# Patient Record
Sex: Male | Born: 1950
Health system: Southern US, Community
[De-identification: ages and names within clinical notes are randomized; demographics above are authoritative.]

## PROBLEM LIST (undated history)

## (undated) DIAGNOSIS — I2510 Atherosclerotic heart disease of native coronary artery without angina pectoris: Secondary | ICD-10-CM

## (undated) DIAGNOSIS — F419 Anxiety disorder, unspecified: Secondary | ICD-10-CM

## (undated) DIAGNOSIS — F329 Major depressive disorder, single episode, unspecified: Secondary | ICD-10-CM

## (undated) DIAGNOSIS — I10 Essential (primary) hypertension: Secondary | ICD-10-CM

## (undated) DIAGNOSIS — J42 Unspecified chronic bronchitis: Secondary | ICD-10-CM

## (undated) DIAGNOSIS — I255 Ischemic cardiomyopathy: Secondary | ICD-10-CM

## (undated) DIAGNOSIS — E669 Obesity, unspecified: Secondary | ICD-10-CM

## (undated) DIAGNOSIS — K219 Gastro-esophageal reflux disease without esophagitis: Secondary | ICD-10-CM

## (undated) DIAGNOSIS — I219 Acute myocardial infarction, unspecified: Secondary | ICD-10-CM

## (undated) DIAGNOSIS — N529 Male erectile dysfunction, unspecified: Secondary | ICD-10-CM

## (undated) DIAGNOSIS — F32A Depression, unspecified: Secondary | ICD-10-CM

## (undated) DIAGNOSIS — E785 Hyperlipidemia, unspecified: Secondary | ICD-10-CM

## (undated) DIAGNOSIS — R7303 Prediabetes: Secondary | ICD-10-CM

## (undated) DIAGNOSIS — N200 Calculus of kidney: Secondary | ICD-10-CM

## (undated) DIAGNOSIS — N182 Chronic kidney disease, stage 2 (mild): Secondary | ICD-10-CM

## (undated) HISTORY — PX: CORONARY ANGIOPLASTY WITH STENT PLACEMENT: SHX49

## (undated) HISTORY — DX: Male erectile dysfunction, unspecified: N52.9

## (undated) HISTORY — DX: Calculus of kidney: N20.0

## (undated) HISTORY — PX: CYSTOSCOPY W/ STONE MANIPULATION: SHX1427

## (undated) HISTORY — DX: Atherosclerotic heart disease of native coronary artery without angina pectoris: I25.10

## (undated) HISTORY — DX: Essential (primary) hypertension: I10

---

## 1992-02-10 HISTORY — PX: CORONARY ARTERY BYPASS GRAFT: SHX141

## 1999-11-12 ENCOUNTER — Encounter: Payer: Self-pay | Admitting: Emergency Medicine

## 1999-11-12 ENCOUNTER — Inpatient Hospital Stay (HOSPITAL_COMMUNITY): Admission: EM | Admit: 1999-11-12 | Discharge: 1999-11-15 | Payer: Self-pay | Admitting: Emergency Medicine

## 2000-11-12 ENCOUNTER — Encounter: Payer: Self-pay | Admitting: *Deleted

## 2000-11-12 ENCOUNTER — Ambulatory Visit (HOSPITAL_COMMUNITY): Admission: RE | Admit: 2000-11-12 | Discharge: 2000-11-13 | Payer: Self-pay | Admitting: Unknown Physician Specialty

## 2001-01-12 ENCOUNTER — Encounter: Payer: Self-pay | Admitting: Emergency Medicine

## 2001-01-12 ENCOUNTER — Emergency Department (HOSPITAL_COMMUNITY): Admission: EM | Admit: 2001-01-12 | Discharge: 2001-01-12 | Payer: Self-pay | Admitting: Emergency Medicine

## 2001-02-07 ENCOUNTER — Encounter: Payer: Self-pay | Admitting: Orthopedic Surgery

## 2001-02-07 ENCOUNTER — Ambulatory Visit (HOSPITAL_COMMUNITY): Admission: RE | Admit: 2001-02-07 | Discharge: 2001-02-07 | Payer: Self-pay | Admitting: Orthopedic Surgery

## 2001-04-04 ENCOUNTER — Ambulatory Visit (HOSPITAL_BASED_OUTPATIENT_CLINIC_OR_DEPARTMENT_OTHER): Admission: RE | Admit: 2001-04-04 | Discharge: 2001-04-04 | Payer: Self-pay | Admitting: Orthopedic Surgery

## 2005-08-09 ENCOUNTER — Emergency Department (HOSPITAL_COMMUNITY): Admission: EM | Admit: 2005-08-09 | Discharge: 2005-08-09 | Payer: Self-pay | Admitting: Emergency Medicine
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
## 2007-04-04 ENCOUNTER — Emergency Department (HOSPITAL_COMMUNITY): Admission: EM | Admit: 2007-04-04 | Discharge: 2007-04-05 | Payer: Self-pay | Admitting: Emergency Medicine

## 2007-04-12 ENCOUNTER — Emergency Department (HOSPITAL_COMMUNITY): Admission: EM | Admit: 2007-04-12 | Discharge: 2007-04-12 | Payer: Self-pay | Admitting: Emergency Medicine

## 2007-04-14 ENCOUNTER — Emergency Department (HOSPITAL_COMMUNITY): Admission: EM | Admit: 2007-04-14 | Discharge: 2007-04-15 | Payer: Self-pay | Admitting: Emergency Medicine

## 2008-03-02 ENCOUNTER — Emergency Department (HOSPITAL_COMMUNITY): Admission: EM | Admit: 2008-03-02 | Discharge: 2008-03-02 | Payer: Self-pay | Admitting: Emergency Medicine

## 2010-01-13 ENCOUNTER — Emergency Department (HOSPITAL_COMMUNITY)
Admission: EM | Admit: 2010-01-13 | Discharge: 2010-01-14 | Payer: Self-pay | Source: Home / Self Care | Admitting: Emergency Medicine

## 2010-04-05 ENCOUNTER — Inpatient Hospital Stay (INDEPENDENT_AMBULATORY_CARE_PROVIDER_SITE_OTHER)
Admission: RE | Admit: 2010-04-05 | Discharge: 2010-04-05 | Disposition: A | Discharge status: Home / Self Care | Payer: Self-pay | Source: Ambulatory Visit | Attending: Family Medicine | Admitting: Family Medicine

## 2010-04-05 DIAGNOSIS — R05 Cough: Secondary | ICD-10-CM

## 2010-04-05 DIAGNOSIS — N529 Male erectile dysfunction, unspecified: Secondary | ICD-10-CM

## 2010-04-24 ENCOUNTER — Inpatient Hospital Stay (INDEPENDENT_AMBULATORY_CARE_PROVIDER_SITE_OTHER)
Admission: RE | Admit: 2010-04-24 | Discharge: 2010-04-24 | Disposition: A | Discharge status: Home / Self Care | Payer: Self-pay | Source: Ambulatory Visit | Attending: Family Medicine | Admitting: Family Medicine

## 2010-04-24 DIAGNOSIS — M461 Sacroiliitis, not elsewhere classified: Secondary | ICD-10-CM

## 2010-04-24 DIAGNOSIS — S335XXA Sprain of ligaments of lumbar spine, initial encounter: Secondary | ICD-10-CM

## 2010-04-24 DIAGNOSIS — S93409A Sprain of unspecified ligament of unspecified ankle, initial encounter: Secondary | ICD-10-CM

## 2010-04-28 ENCOUNTER — Emergency Department (HOSPITAL_COMMUNITY): Payer: Self-pay

## 2010-04-28 ENCOUNTER — Emergency Department (HOSPITAL_COMMUNITY)
Admission: EM | Admit: 2010-04-28 | Discharge: 2010-04-29 | Disposition: A | Discharge status: Home / Self Care | Payer: Self-pay | Attending: Emergency Medicine | Admitting: Emergency Medicine

## 2010-04-28 DIAGNOSIS — S8990XA Unspecified injury of unspecified lower leg, initial encounter: Secondary | ICD-10-CM | POA: Insufficient documentation

## 2010-04-28 DIAGNOSIS — R0609 Other forms of dyspnea: Secondary | ICD-10-CM | POA: Insufficient documentation

## 2010-04-28 DIAGNOSIS — W138XXA Fall from, out of or through other building or structure, initial encounter: Secondary | ICD-10-CM | POA: Insufficient documentation

## 2010-04-28 DIAGNOSIS — R030 Elevated blood-pressure reading, without diagnosis of hypertension: Secondary | ICD-10-CM | POA: Insufficient documentation

## 2010-04-28 DIAGNOSIS — R059 Cough, unspecified: Secondary | ICD-10-CM | POA: Insufficient documentation

## 2010-04-28 DIAGNOSIS — M25476 Effusion, unspecified foot: Secondary | ICD-10-CM | POA: Insufficient documentation

## 2010-04-28 DIAGNOSIS — R109 Unspecified abdominal pain: Secondary | ICD-10-CM | POA: Insufficient documentation

## 2010-04-28 DIAGNOSIS — I252 Old myocardial infarction: Secondary | ICD-10-CM | POA: Insufficient documentation

## 2010-04-28 DIAGNOSIS — N200 Calculus of kidney: Secondary | ICD-10-CM | POA: Insufficient documentation

## 2010-04-28 DIAGNOSIS — R0989 Other specified symptoms and signs involving the circulatory and respiratory systems: Secondary | ICD-10-CM | POA: Insufficient documentation

## 2010-04-28 DIAGNOSIS — Z951 Presence of aortocoronary bypass graft: Secondary | ICD-10-CM | POA: Insufficient documentation

## 2010-04-28 DIAGNOSIS — R05 Cough: Secondary | ICD-10-CM | POA: Insufficient documentation

## 2010-04-28 DIAGNOSIS — R609 Edema, unspecified: Secondary | ICD-10-CM | POA: Insufficient documentation

## 2010-04-28 DIAGNOSIS — R51 Headache: Secondary | ICD-10-CM | POA: Insufficient documentation

## 2010-04-28 DIAGNOSIS — M25579 Pain in unspecified ankle and joints of unspecified foot: Secondary | ICD-10-CM | POA: Insufficient documentation

## 2010-04-28 DIAGNOSIS — R079 Chest pain, unspecified: Secondary | ICD-10-CM | POA: Insufficient documentation

## 2010-04-28 DIAGNOSIS — M25473 Effusion, unspecified ankle: Secondary | ICD-10-CM | POA: Insufficient documentation

## 2010-04-28 LAB — POCT CARDIAC MARKERS
CKMB, poc: 2.1 ng/mL (ref 1.0–8.0)
Myoglobin, poc: 104 ng/mL (ref 12–200)

## 2010-04-28 LAB — BASIC METABOLIC PANEL
CO2: 24 mEq/L (ref 19–32)
Calcium: 9 mg/dL (ref 8.4–10.5)
Creatinine, Ser: 1.42 mg/dL (ref 0.4–1.5)
GFR calc Af Amer: >60 mL/min (ref >60)
Glucose, Bld: 178 mg/dL — ABNORMAL HIGH (ref 70–99)
Potassium: 3.3 mEq/L — ABNORMAL LOW (ref 3.5–5.1)
Sodium: 133 mEq/L — ABNORMAL LOW (ref 135–145)

## 2010-04-28 LAB — URINE MICROSCOPIC-ADD ON

## 2010-04-28 LAB — CBC
Hemoglobin: 14.4 g/dL (ref 13.0–17.0)
MCH: 30.6 pg (ref 26.0–34.0)
MCHC: 34.7 g/dL (ref 30.0–36.0)
MCV: 88.1 fL (ref 78.0–100.0)
Platelets: 156 10*3/uL (ref 150–400)
RDW: 12.1 % (ref 11.5–15.5)
WBC: 9.4 10*3/uL (ref 4.0–10.5)

## 2010-04-28 LAB — URINALYSIS, ROUTINE W REFLEX MICROSCOPIC
Ketones, ur: NEGATIVE mg/dL
Protein, ur: 30 mg/dL — AB
Urobilinogen, UA: 0.2 mg/dL (ref 0.0–1.0)

## 2010-04-28 LAB — HEPATIC FUNCTION PANEL
ALT: 30 U/L (ref 0–53)
Alkaline Phosphatase: 59 U/L (ref 39–117)

## 2010-04-28 LAB — DIFFERENTIAL
Lymphocytes Relative: 27 % (ref 12–46)
Neutrophils Relative %: 67 % (ref 43–77)

## 2010-04-29 ENCOUNTER — Encounter (HOSPITAL_COMMUNITY): Payer: Self-pay | Admitting: Radiology

## 2010-04-29 MED ORDER — IOHEXOL 300 MG/ML  SOLN
100.0000 mL | Freq: Once | INTRAMUSCULAR | Status: AC | PRN
  Administered 2010-04-29: 100 mL via INTRAVENOUS

## 2010-06-03 ENCOUNTER — Encounter: Payer: Self-pay | Admitting: Cardiology

## 2010-06-27 NOTE — Cardiovascular Report (Signed)
[place]. [facility]  Patient:    William Matthews, William Matthews                       MRN: [id number] Proc. Date: 11/12/99 Adm. Date:  [id number] Disc. Date: [id number] Attending:  Darlin Priestly CC:         Madaline Savage, M.D.                        Cardiac Catheterization  PROCEDURES: 1. Left heart catheterization. 2. Coronary angiography. 3. Left ventriculogram. 4. Saphenous vein grafts. 5. Left internal mammary angiogram. 6. Right coronary artery-mid    - Angiojet rheolytic system    - Placement of intracoronary stent  ATTENDING:  Lenise Herald, M.D.  COMPLICATIONS:  None.  INDICATIONS:  Mr. Yun is a 60 year old white male previous patient of Dr. Lavonne Chick with a history of coronary artery bypass surgery in 1994 by Dr. Kennon Portela consisting of a sequential LIMA to LAD and second diagonal and a sequential vein graft to OM-1 and first diagonal.  The patient had an ungrafted RCA.  The patient has done well for the last seven years until 11:00 this a.m. when he developed shortness of breath and substernal chest pain radiating to both arms with nausea and diaphoresis.  He presents to the ER with acute inferior MI.  He is now brought to the cardiac catheterization lab emergently.  DESCRIPTION OF PROCEDURE:  After giving informed written consent, the patient was brought to the cardiac catheterization lab where his right and left groins were shaved, prepped, and draped in the usual sterile fashion.  ECT monitoring was established.  Using modified Seldinger technique, a 7-French arterial sheath was inserted in the right femoral artery.  A 6-French venous sheath was inserted in the right femoral vein.  Next, a 6-French transvenous pacing wire was floated into the RV apex without complications.  RV pacing thresholds were then determined.  Next, a 6-French JL4 diagnostic catheter was then used to engage the left main, and a selective angiogram was then  performed.  This revealed a 50% mid left main.  There was a totally occluded LAD proximally. The mid and distal LAD fills via a sequential left internal mammary to the second diagonal and the LAD.  The IMA is patent.  There is a 60% lesion in the diagonal beyond the IMA insertion.  There is a 50% lesion in the IMA just prior to the insertion of the LAD and a 50% lesion in the LAD beyond the IMA insertion.  There is a second diagonal which fills via a sequential saphenous vein graft to OM-1 and D-1.  The vein graft proper has no significant disease.  There is no significant disease in the diagonal beyond the vein graft touchdown.  The circumflex is a medium sized vessel which coursed in the AV groove and gives rise to three obtuse marginal branches.  The 30% proximal circumflex lesion appear at the first OM, fills via a sequential saphenous vein graft to the first OM and D-1.  There is a 60% lesion in the OM beyond the vein graft touchdown.  The second and third OMs are medium sized vessels with no significant disease.  The right coronary artery is a large vessel which is totally occluded in its proximal portion with visible thrombus.  A left ventriculogram reveals preserved EF at 50% with posterobasal hypokinesis.  There is mild to moderate mitral regurgitation.  HEMODYNAMICS:  Systemic arterial pressure 132/86; LV systemic pressure 130/12, LVEDP of 18.  INTERVENTIONAL PROCEDURE:  Following diagnostic angiography, a 7-French JR4 guiding catheter with sideholes was coaxially engaged in the right coronary ostium.  Selective angiograms were then performed.  Next, a long Patriot 0.014 exchange length guide wire was advanced out of the guiding catheter into the proximal RCA.  The guide wire was then used to cross the total RCA occlusion and position the distal PDA without difficulty.  Next, the Angiojet 4-French catheter was then tracked into the mid RCA.  Two subsequent runs were  then performed throughout the mid and proximal RCA for a total of 48 seconds. Followup angiograms revealed a small linear dissection in the mid RCA at the 99% stenosis mid RCA lesion.  The Angiojet catheter was then exchanged for a Penta 3.0 x 15-mm stent which was then positioned across the stenotic lesion and subsequently deployed to a maximum of 14 atmospheres for a total of one minute.  Followup angiograms revealed no evidence of dissection or thrombus with TIMI-3 flow to the distal vessel.  There appeared to be late mid 50% and distal 50% stenotic lesions in the RCA.  In addition, there is noted to be a 60% lesion in the proximal PDA.  Intravenous doses of Aggrastat were maintained.  The patient was given IV heparin to maintain an ACT of between 200 and 300.  Final orthogonal angiograms reveal less than 10% residual stenosis in the mid RCA total occlusion.  There is TIMI-3 flow to the distal vessel with no evidence of dissection or thrombus.  At this point, we elected to conclude the procedure.  All balloons, ______, and catheters were removed.  A hemostatic sheath was then placed, and the patient was transferred back to the CCU in stable condition and to continue with Aggrastat infusion to complete 12 hours.  CONCLUSIONS: 1. Successful rheolytic thrombectomy using 4-French Angiojet catheter with    adjunct stent placement using a Penta 3.0 x 15-mm coronary stent in the mid    RCA stenotic lesion. 2. Patent sequential LIMA to the second diagonal and LAD with a 40% stenotic    lesion in the IMA just prior to the touchdown on the LAD, a 60% lesion in    the diagonal beyond the touchdown, a 50% lesion in the LAD beyond the    touchdown. 3. Patent sequential saphenous vein graft to OM-1 and D-1 with no significant    disease in the body of the graft.  There is a 60% lesion in the OM-1 beyond    the graft touchdown. 4. Preserved LV function with wall motion abnormalities noted  above. 5. Mild to moderate mitral regurgitation. DD:  11/12/99 TD:  11/12/99 Job: [id number]

## 2010-06-27 NOTE — Cardiovascular Report (Signed)
[place]. [facility]  Patient:    BLUE, WINTHER Visit Number: [id number] MRN: [id number]          Service Type: CAT Location: [id number] Attending Physician:  Darlin Priestly Dictated by:   Lenise Herald, M.D. Proc. Date: 11/12/00 Admit Date:  11/12/2000                          Cardiac Catheterization  PROCEDURES: 1. Left heart catheterization. 2. Coronary angiography. 3. Left ventriculogram. 4. Saphenous vein grafts. 5. Left internal mammary angiography. 6. RCA - mid.        - cutting balloon angioplasty.  ATTENDING:  Lenise Herald, M.D.  COMPLICATIONS:  None.  INDICATIONS:  Mr. Widmer is a 60 year old white male with a history of extensive CAD, status post coronary artery bypass surgery consisting of sequential LIMA to D-2 and LAD, and sequential vein graft to D-1 and ramus intermedius.  The patient presented on November 12, 1999, with acute inferior wall MI and was noted to have a totally occluded mid RCA.  The patient underwent angiojet of the mid RCA, with subsequent stenting using a 3.0 Penta. The patient has subsequently stopped all of his medications and presented back to the office with recurrent angina.  He is now brought back for repeat catheterization.  DESCRIPTION OF PROCEDURE:  After giving informed written consent, the patient brought to cardiac catheterization where right and left groins were shaved, prepped, and draped in the usual sterile fashion.  ECG monitoring was established.  Using the modified Seldinger technique, a 6-French arterial sheath was inserted into the right femoral artery.  Six French diagnostic catheters were then used to perform diagnostic angiography.  This revealed a medium sized left main which is diffusely diseased up to 50%.  The LAD is totally occluded in its proximal portion.  The mid and distal LAD fill via a sequential LIMA to the second diagonal and LAD.  The LIMA is widely patent, with  a 50% stenotic lesion at the insertion site on the LAD.  The second diagonal is a small vessel with no significant disease.  The mid and distal LAD have no significant disease.  There is a first diagonal which fills via sequential saphenous vein graft to D-1 and OM-1.  This graft is widely patent.  The first diagonal is a small vessel.  The first obtuse marginal is a medium sized vessel with a 40% lesion beyond the graft insertion.  The left circumflex is a medium sized vessel that coursed in the AV groove and gave rise to four obtuse marginal branches.  The AV groove circumflex then has 30% proximal stenosis.  The first OM, as previously stated, is a grafted vessel with a 40% stenotic lesion beyond the graft insertion.  The second, third, and fourth OMs are medium sized vessel with no significant disease.  The right coronary artery is a medium sized vessel which is noted to be dominant and gives rise to the PDA.  There is a stent in the mid portion of the RCA which appears to have 95% mid in-stent restenosis.  There are course irregularities up to 50% throughout the distal RCA.  Left ventriculogram reveals preserved EF of 55-60% with mild inferior hypokinesis.  HEMODYNAMICS:  Systemic arterial pressure 116/74, LV systemic pressure 119/8, LV EDP 18.  INTERVENTIONAL PROCEDURE, RCA - MID:  Following diagnostic angiography, the 6-French sheath was then exchanged for a 7-French sheath and a 7-French  JR-4 guiding catheter with side-holes was coaxially engaged in the right coronary ostium.  Next, a short Patriot guide wire was advanced down the guiding catheter into the proximal RCA and was then passed to the PDA without difficulty.  Next, a 3.0 x 10 mm cutting balloon was then tracked to the mid portion of the RCA and carefully positioned in the mid portion of the stent. Five subsequent inflations to a maximum of 6 atm were then performed for a total of approximately five minutes with no  evidence of dissection or thrombus and TIMI-3 flow to the distal vessel.  There was no evidence of dissection or thrombus.  IV Integrilin was used with IV heparin to maintain the ACT between 200 and 300.  Final orthogonal angiograms revealed less than 10% residual stenosis within the mid portion of the stent with course irregularities throughout the distal portion of the RCA.  At this point, we elected to conclude the procedure.  All balloons, wires, and catheters were removed.  Hemostatic sheath was sewn into place.  The patient was returned back to the ward in stable condition to continue with Integrilin infusion to complete 18 hours.  CONCLUSIONS: 1. Successful cutting balloon angioplasty of the mid right coronary artery    in-stent stenotic lesion. 2. Patent sequential left internal mammary artery graft to diagonal #2 and    left anterior descending artery with a 50% stenotic lesion at the insertion    on the left anterior descending artery. 3. Patent saphenous vein graft to the first diagonal and first obtuse marginal    with a 40% lesion in the obtuse marginal beyond the graft insertion. 4. Diffuse 50% left main disease. 5. Normal left ventricular systolic function. 6. Adjunct use of Integrilin infusion. Dictated by:   Lenise Herald, M.D. Attending Physician:  Darlin Priestly DD:  11/12/00 TD:  11/13/00 Job: [id number]

## 2010-06-27 NOTE — Discharge Summary (Signed)
[place]. [facility]  Patient:    William Matthews, William Matthews                       MRN: [id number] Adm. Date:  [id number] Disc. Date: [id number] Attending:  Darlin Priestly Dictator:   William Matthews, F.N.P.C. CC:         William Matthews, M.D.   Discharge Summary  DISCHARGE DIAGNOSES: 1. Acute inferior wall myocardial infarction. 2. Coronary artery disease.    a. Rescue percutaneous transluminal coronary angioplasty and stent with       the use of angiojet to the mid right coronary artery.    b. Coronary artery bypass grafting 1994. 3. Hyperlipidemia.  DISCHARGE CONDITION:  Improved.  PROCEDURES: 1. November 12, 1999, urgent combined left heart catheterization with graft    visualization by Dr. Lenise Matthews. 2. November 12, 1999, rescue percutaneous transluminal coronary angioplasty and    stent to the mid right coronary artery.  PENDING STUDIES:  (Not currently in chart). 1. A 2-D echocardiogram was done, results not yet back in this chart.  DISCHARGE MEDICATIONS: 1. Plavix 75 mg 1 a day for 30 days. 2. Enteric coated aspirin 325 mg one a day. 3. Protonix 40 mg 1 a day. 4. Toprol XL 25 mg a day. 5. Altace 2.5 mg one twice a day.  DISCHARGE INSTRUCTIONS:  ACTIVITY:  No strenuous activity, no sexual activity or heavy lifting greater than 10 pounds for 2 weeks.  DIET:  Low fat, low salt, low cholesterol diet.  WOUND CARE:  May shower.  Observe for bruising, bleeding or inflammation at the catheterization site.  Call for fever.  WORK STATUS:  May return to work in 2 weeks.  May not drive for 2 weeks.  FOLLOWUP:  See Dr. Jenne Matthews in 2 weeks, call the office for an appointment data and time.  HISTORY OF PRESENT ILLNESS:  This 60 year old patient woke up on the morning of November 12, 1999, feeling nauseated and weak.  At 1 p.m. the same day he developed chest pain radiating to both arms.  He became diaphoretic and dizzy. He took 2 nitroglycerin  sublingual without relief and came to the emergency room.  In the emergency room the patient had ST elevations in his inferior leads 2, 3, and F with reciprocal changes in aVL, lead 1, and V2.  He was placed on IV heparin, IV nitroglycerin, and was felt be in acute inferior myocardial infarction and was taken emergently to the catheterization lab by Dr. Lenise Matthews.  PAST MEDICAL HISTORY:  Significant for coronary artery bypass grafting x 4 with a LIMA to the LAD, second diagonal saphenous vein graft to the OM and first diagonal in 1994.  Previous to that he had angioplasties to the LAD.  Since his bypass he has not seen a physician regularly and is currently not on any medication. Other histories are hyperlipidemia.  FAMILY HISTORY:  Mother died at 6 of coronary disease and COPD.  Father is 43.  SOCIAL HISTORY:  Denies tobacco, does not use drugs, alcohol.  He has 3 children and is single.  ALLERGIES:  No known drug allergies.  OUTPATIENT MEDICATIONS:  None.  REVIEW OF SYSTEMS:  Essentially negative except for as stated in HPI.  PHYSICAL EXAMINATION:  (At discharge).  VITAL SIGNS:  Blood pressure 99/69, pulse 77, respirations 20, temperature 97.1.  GENERAL:  Alert, oriented male in no acute distress.  LUNGS:  Clear without rales, rhonchi  or wheezes.  CHEST:  Regular rate and rhythm S1, S2.  EXTREMITIES:  Without edema.  LABORATORY DATA:  Admission labs, hemoglobin 14.8, hematocrit 41, WBC 12.4, MCV 88, platelets 228, neutrophils 81, lymphs 14, monos 4, eosinophils 0, basophils 0, leukocytes 1.  White count prior to discharge was 10.7. Hemoglobin 14, hematocrit 39.  Admission pro time 13.8, INR 1.1, PTT 25.  Chemistries:  Sodium 142, potassium 43, chloride 104, CO2 23, glucose 109, BUN 14, creatinine 1.2, calcium 8.8.  These remained stable.  LFTs:  Total protein 6.5, albumin 3.7, AST 41, ALT 26, ALP 56 and total bilirubin 0.9.  Cardiac enzymes:  Initial was 107,  mV 1.5, troponins 0.02.  Second CK was 1729, MB of 279, troponin I 55.51, and that was a peak.  Prior to discharge he was 690 with 92 MBs and 13.23.  Cholesterol panel:  Total cholesterol 162, triglycerides 83, HDL 29, LDL 116. TSH was 1.699.  EKG on admission showed acute ST elevations in 2, 3, and aVF with reciprocal changes in 1, aVL, and V1 and V2 and a complete right bundle branch block.  Followup EKG status post angioplasty and stent, incomplete right bundle. Resolution of ST elevation, though he does have Qs in 2, 3, and aVF.  On the November 13, 1999, sinus rhythm, incomplete right bundle, inferior infarction, and no other significant changes.  Chest x-ray on admission:  No acute disease found.  Mediastinal clips are secondary to prior CABG.  On November 12, 1999, left heart catheterization by Dr. Lenise Matthews. Successful ______  thrombectomy with angiojet catheter and stent placement in the mid RCA reducing 199% stenosis to 0.  He has a patent sequential LIMA to the second diagonal LAD, a 40% stenotic lesion in the LIMA just prior to the touchdown on the LAD and a 60% lesion in the diagonal beyond the touchdown.  A 50% lesion in the LAD beyond the touchdown.  Patent saphenous vein graft to the OM-1 and diagonal 1 but no significant disease.  There is a 60% lesion in the OM-1 beyond the graft touchdown.  Preserved LV function with an EF of 50%.  HOSPITAL COURSE:  [name] was admitted on November 12, 1999, with an acute inferior MI, after experiencing chest pain at home.  Placed on IV heparin and nitroglycerin and taken emergently to the catheterization lab where Dr. Jenne Matthews angioplastied and stented his mid RCA for significant stenosis and thrombus.  Please see catheterization report.  The patient preceded to recover fairly quickly and was anxious to be discharged and there is concern about his compliance as he was on no medications and had not seen a physician regularly  since his bypass surgery.   By November 14, 1999, he was stable, able to ambulate without difficulty, and he was kept for further observation for 1 day and by November 15, 1999, was ready for discharge.  He will follow up as an outpatient.  He will also be approved at [facility] and that is why he was not placed on a Statin agent here in the hospital. DD:  01/18/00 TD:  01/19/00 Job: [id number]

## 2010-06-28 ENCOUNTER — Encounter: Payer: Self-pay | Admitting: Cardiology

## 2010-07-02 ENCOUNTER — Ambulatory Visit (INDEPENDENT_AMBULATORY_CARE_PROVIDER_SITE_OTHER): Payer: Self-pay | Admitting: Cardiology

## 2010-07-02 ENCOUNTER — Encounter: Payer: Self-pay | Admitting: Cardiology

## 2010-07-02 DIAGNOSIS — I209 Angina pectoris, unspecified: Secondary | ICD-10-CM

## 2010-07-02 DIAGNOSIS — I200 Unstable angina: Secondary | ICD-10-CM | POA: Insufficient documentation

## 2010-07-02 DIAGNOSIS — N529 Male erectile dysfunction, unspecified: Secondary | ICD-10-CM | POA: Insufficient documentation

## 2010-07-02 DIAGNOSIS — I10 Essential (primary) hypertension: Secondary | ICD-10-CM

## 2010-07-02 DIAGNOSIS — I2510 Atherosclerotic heart disease of native coronary artery without angina pectoris: Secondary | ICD-10-CM

## 2010-07-02 MED ORDER — SIMVASTATIN 40 MG PO TABS: 40.0000 mg | ORAL_TABLET | Freq: Every day | ORAL | Status: DC

## 2010-07-02 MED ORDER — LISINOPRIL 10 MG PO TABS: 10.0000 mg | ORAL_TABLET | Freq: Every day | ORAL | Status: DC

## 2010-07-02 NOTE — Assessment & Plan Note (Signed)
Patient very concerned about this. I have asked him to see urology. If myoview low risk, ok for viagra. Patient instructed on risk of mixing with NTG.

## 2010-07-02 NOTE — Assessment & Plan Note (Signed)
Patient on no medications. Add enteric-coated aspirin 81 mg daily. Add statin. We'll begin Zocor 40 mg daily and check lipids and liver in 6 weeks. Goal LDL less than 70.

## 2010-07-02 NOTE — Patient Instructions (Signed)
Your physician has requested that you have en exercise stress myoview. For further information please visit [email or web address] Please follow instruction sheet, as given.  Your physician has recommended you make the following change in your medication:  1) Start lisinopril 10mg  once daily. 2) Start Zocor (simvastatin) 40mg  once daily.  Your physician recommends that you return for lab work in: 1 week- bmp (552.7)  Your physician recommends that you return for a FASTING lipid profile/ liver (552.7;274.05) in 6 weeks.  Your physician wants you to follow-up in: 6 months. You will receive a reminder letter in the mail two months in advance. If you don't receive a letter, please call our office to schedule the follow-up appointment.

## 2010-07-02 NOTE — Assessment & Plan Note (Signed)
Blood pressure elevated. Add lisinopril 10 mg daily. Will avoid beta blockade as based on description this may have caused erectile dysfunction in past.

## 2010-07-02 NOTE — Progress Notes (Signed)
HPI: 60 year old male with past medical history of coronary artery disease status post coronary artery bypass graft for evaluation of chest pain. Previously followed at [facility] heart and vascular. The patient is status post coronary artery bypass and graft consisting of sequential LIMA to D-2 and LAD, and sequential vein graft to D-1 and ramus intermedius. He has also had PCI of his right coronary artery. Note the patient had a CT scan of his chest in March of 2012 that was poor quality but showed no large pulmonary embolus. Patient states that over the past several years he has had chest pressure when walking up hills or stairs relieved with rest. There is dyspnea but no diaphoresis or nausea. There is dyspnea on exertion but no orthopnea or PND. There is no significant pedal edema. Also of note he does not have chest pain at rest.  Current Outpatient Prescriptions  Medication Sig Dispense Refill  . tadalafil (CIALIS) 10 MG tablet Take 10 mg by mouth daily as needed.          Allergies  Allergen Reactions  . Ciprofloxacin Hcl     Past Medical History  Diagnosis Date  . CAD (coronary artery disease)   . ED (erectile dysfunction)   . Hypertension   . Nephrolithiasis     Past Surgical History  Procedure Date  . Coronary artery bypass graft 1994  . Surgery for nephrolithiasis     History   Social History  . Marital Status: Divorced    Spouse Name: N/A    Number of Children: 3  . Years of Education: N/A   Occupational History  . Not on file.   Social History Main Topics  . Smoking status: Never Smoker   . Smokeless tobacco: Not on file  . Alcohol Use: No  . Drug Use: No  . Sexually Active: Not on file   Other Topics Concern  . Not on file   Social History Narrative   Twenty years ago CAD and CABG,no chest pain since per patient but was eval., In ED for chest pain 1 month prev.-MI  r/o but no stress done. Took meds x1 month after MI but caused ED so stopped them had  testosterone check >199,wants cialis or testosterone replacement. "if i can't have sex,life isn't worth living".    Family History  Problem Relation Age of Onset  . Coronary artery disease Mother     Died at age 24 of MI  . Coronary artery disease Father     MI age 28    ROS: C/O some tingling in his lower extremeties and erectile dysfunction but no fevers or chills, productive cough, hemoptysis, dysphasia, odynophagia, melena, hematochezia, dysuria, hematuria, rash, seizure activity, orthopnea, PND, pedal edema, claudication. Remaining systems are negative.  Physical Exam: General:  Well developed/well nourished in NAD Skin warm/dry Patient not depressed No peripheral clubbing Back-normal HEENT-normal/normal eyelids Neck supple/normal carotid upstroke bilaterally; no bruits; no JVD; no thyromegaly chest - CTA/ normal expansion CV - RRR/normal S1 and S2; no murmurs, rubs or gallops;  PMI nondisplaced Abdomen -NT/ND, no HSM, no mass, + bowel sounds, no bruit 2+ femoral pulses, no bruits Ext-trace edema, no chords, 2+ DP Neuro-grossly nonfocal  ECG Normal sinus rhythm at a rate of 77. Prior inferior infarct.

## 2010-07-02 NOTE — Assessment & Plan Note (Signed)
Patient describes stable angina. It has been present for several years. His grafts are 62 years old. Plan to proceed with Myoview for risk stratification. If significantly abnormal he may require cardiac catheterization.

## 2010-07-10 ENCOUNTER — Other Ambulatory Visit (INDEPENDENT_AMBULATORY_CARE_PROVIDER_SITE_OTHER): Payer: Self-pay | Admitting: *Deleted

## 2010-07-10 ENCOUNTER — Ambulatory Visit (HOSPITAL_COMMUNITY): Payer: Self-pay | Attending: Cardiology | Admitting: Radiology

## 2010-07-10 DIAGNOSIS — I2510 Atherosclerotic heart disease of native coronary artery without angina pectoris: Secondary | ICD-10-CM | POA: Insufficient documentation

## 2010-07-10 DIAGNOSIS — I209 Angina pectoris, unspecified: Secondary | ICD-10-CM | POA: Insufficient documentation

## 2010-07-10 DIAGNOSIS — R0989 Other specified symptoms and signs involving the circulatory and respiratory systems: Secondary | ICD-10-CM

## 2010-07-10 DIAGNOSIS — I10 Essential (primary) hypertension: Secondary | ICD-10-CM | POA: Insufficient documentation

## 2010-07-10 DIAGNOSIS — R079 Chest pain, unspecified: Secondary | ICD-10-CM

## 2010-07-10 DIAGNOSIS — I25810 Atherosclerosis of coronary artery bypass graft(s) without angina pectoris: Secondary | ICD-10-CM

## 2010-07-10 LAB — HEPATIC FUNCTION PANEL
AST: 23 U/L (ref 0–37)
Albumin: 4 g/dL (ref 3.5–5.2)
Alkaline Phosphatase: 51 U/L (ref 39–117)
Bilirubin, Direct: 0.2 mg/dL (ref 0.0–0.3)
Total Bilirubin: 1.4 mg/dL — ABNORMAL HIGH (ref 0.3–1.2)
Total Protein: 6.8 g/dL (ref 6.0–8.3)

## 2010-07-10 LAB — BASIC METABOLIC PANEL
Chloride: 103 mEq/L (ref 96–112)
Creatinine, Ser: 1.3 mg/dL (ref 0.4–1.5)
Glucose, Bld: 93 mg/dL (ref 70–99)
Potassium: 4.1 mEq/L (ref 3.5–5.1)

## 2010-07-10 LAB — LIPID PANEL: Triglycerides: 154 mg/dL — ABNORMAL HIGH (ref 0.0–149.0)

## 2010-07-10 MED ORDER — TECHNETIUM TC 99M TETROFOSMIN IV KIT
11.0000 | PACK | Freq: Once | INTRAVENOUS | Status: AC | PRN
  Administered 2010-07-10: 11 via INTRAVENOUS

## 2010-07-10 MED ORDER — TECHNETIUM TC 99M TETROFOSMIN IV KIT
33.0000 | PACK | Freq: Once | INTRAVENOUS | Status: AC | PRN
  Administered 2010-07-10: 33 via INTRAVENOUS

## 2010-07-10 NOTE — Progress Notes (Signed)
[facility] SITE 3 NUCLEAR MED [address] [place] [address] [phone]  Cardiology Nuclear Med Study  William Matthews is a 60 y.o. male [id number] 12-15-1950   Nuclear Med Background Indication for Stress Test:  Evaluation for Ischemia, Graft Patency, Stent Patency, PTCA Patency and Post Hospital (Inferior) an d2001 Stents-RCA Cardiac Risk Factors: Family History - CAD, Hypertension and RBBB  Symptoms:  Chest Pain, Chest Pain with Exertion (last date of chest discomfort 2 months ago), DOE, Light-Headedness and SOB   Nuclear Pre-Procedure Caffeine/Decaff Intake:  None NPO After: 8:00pm   Lungs:  Clear IV 0.9% NS with Angio Cath:  20g  IV Site: R Wrist  IV Started by:  Doyne Keel, CNMT  Chest Size (in):  48 Cup Size: n/a  Height: 5\' 10"  (1.778 m)  Weight:  252 lb (114.306 kg)  BMI:  Body mass index is 36.16 kg/(m^2). Tech Comments:  NA    Nuclear Med Study 1 or 2 day study: 1 day  Stress Test Type:  Stress  Reading MD: Olga Millers, MD  Order Authorizing Provider:  Dr. Olga Millers  Resting Radionuclide: Technetium 79m Tetrofosmin  Resting Radionuclide Dose: 11 mCi   Stress Radionuclide:  Technetium 28m Tetrofosmin  Stress Radionuclide Dose: 33 mCi           Stress Protocol Rest HR: 67 Stress HR: 151  Rest BP: 127/91 Stress BP: 215/109  Exercise Time (min): 6:00 METS: 7.2   Predicted Max HR: 159 bpm % Max HR: 94.97 bpm Rate Pressure Product: 46962   Dose of Adenosine (mg):  n/a Dose of Lexiscan: n/a mg  Dose of Atropine (mg): n/a Dose of Dobutamine: n/a mcg/kg/min (at max HR)  Stress Test Technologist: Bonnita Levan, RN  Nuclear Technologist:  Domenic Polite, CNMT     Rest Procedure:  Myocardial perfusion imaging was performed at rest 45 minutes following the intravenous administration of Technetium 63m Tetrofosmin. Rest ECG: NSR  Stress Procedure:  The patient exercised for 6:00.  The patient stopped due to fatigue and dyspnea and  denied any chest pain.  There were no significant ST-T wave changes.  Technetium 80m Tetrofosmin was injected at peak exercise and myocardial perfusion imaging was performed after a brief delay. Stress ECG: No significant ST segment change suggestive of ischemia.  QPS Raw Data Images:  Normal; no motion artifact. Stress Images:  There is decreased uptake in the distal anterior wall, apex and inferior wall. Rest Images:  There is decreased uptake in the distal anterior wall, apex and inferior wall; apical defect less prominent compared to the stress images. Subtraction (SDS):  These findings are consistent with prior distal anterior, apical and inferior infarct; mild peri-infarct ischemia at the apex. Transient Ischemic Dilatation (Normal <1.22):  1.11 Lung/Heart Ratio (Normal <0.45):  0.33  Quantitative Gated Spect Images QGS EDV:  155 ml QGS ESV:  81 ml QGS cine images:  Inferior hypokinesis; evidence of LVE. QGS EF: 48%  Impression Exercise Capacity:  Fair exercise capacity. BP Response:  Hypertensive blood pressure response. Clinical Symptoms:  No chest pain. ECG Impression:  No significant ST segment change suggestive of ischemia. Comparison with Prior Nuclear Study: No images to compare  Overall Impression:  Abnormal stress nuclear study with a large, severe defect involving the distal anterior, apical and inferior wall consistent with prior infarct; mild peri-infarct ischemia towards the apex.    Olga Millers

## 2010-07-11 ENCOUNTER — Telehealth: Payer: Self-pay | Admitting: Cardiology

## 2010-07-11 NOTE — Telephone Encounter (Signed)
PT AWARE OF LAB RESULTS./CY 

## 2010-07-11 NOTE — Progress Notes (Signed)
Copy routed to Dr. Jens Som.Scarlette Ar

## 2010-07-16 ENCOUNTER — Telehealth: Payer: Self-pay | Admitting: Cardiology

## 2010-07-16 NOTE — Progress Notes (Signed)
Spoke with pt, appt made for pt to discuss with dr Jens Som Deliah Goody

## 2010-07-16 NOTE — Telephone Encounter (Signed)
Spoke with pt, he is aware of labs and stress test results. appt made for follow up William Matthews

## 2010-07-16 NOTE — Telephone Encounter (Signed)
Pt would like to be called with test results.

## 2010-08-11 ENCOUNTER — Ambulatory Visit (INDEPENDENT_AMBULATORY_CARE_PROVIDER_SITE_OTHER): Payer: Self-pay | Admitting: Cardiology

## 2010-08-11 ENCOUNTER — Encounter: Payer: Self-pay | Admitting: Cardiology

## 2010-08-11 ENCOUNTER — Other Ambulatory Visit: Payer: Self-pay | Admitting: *Deleted

## 2010-08-11 VITALS — BP 154/99 | HR 78 | Resp 14 | Ht 70.0 in | Wt 251.0 lb

## 2010-08-11 DIAGNOSIS — R9430 Abnormal result of cardiovascular function study, unspecified: Secondary | ICD-10-CM | POA: Insufficient documentation

## 2010-08-11 DIAGNOSIS — I10 Essential (primary) hypertension: Secondary | ICD-10-CM

## 2010-08-11 MED ORDER — PRAVASTATIN SODIUM 40 MG PO TABS: 40.0000 mg | ORAL_TABLET | Freq: Every evening | ORAL | Status: DC

## 2010-08-11 MED ORDER — AMLODIPINE BESYLATE 10 MG PO TABS: 10.0000 mg | ORAL_TABLET | Freq: Every day | ORAL | Status: DC

## 2010-08-11 NOTE — Patient Instructions (Signed)
Your physician recommends that you schedule a follow-up appointment in: 6 WEEKS  STOP LISINOPRIL  START AMLODIPINE 10MG  ONCE DAILY  START PRAVASTATIN 40MG  ONCE DAILY AT BEDTIME  Your physician recommends that you return for lab work in: 6 WEEKS

## 2010-08-11 NOTE — Assessment & Plan Note (Signed)
Patient's Myoview showed prior infarct with mild peri-infarct ischemia. Long discussion today concerning this study. It appears to be a low risk study. He states he is not having exertional chest pain. We will plan medical therapy for now. He will increase his activities. If he notes exertional chest tightness will reduce his activities and we will proceed with cardiac catheterization. Patient in agreement.

## 2010-08-11 NOTE — Assessment & Plan Note (Signed)
Continue aspirin. Patient complained of a cough. Discontinue lisinopril. Add Pravachol 40 mg daily. Check lipids and liver in 6 weeks.

## 2010-08-11 NOTE — Assessment & Plan Note (Signed)
Patient not taking medications daily. Also complains of cough. Discontinue lisinopril. Begin amlodipine 10 mg daily. Monitor blood pressure at home and add additional medications as needed. Compliance stressed.

## 2010-08-11 NOTE — Progress Notes (Signed)
HPI: Pleasant male with past medical history of coronary artery disease status post coronary artery bypass graft I saw in May of 2012 for evaluation of chest pain. Previously followed at [facility] heart and vascular. The patient is status post coronary artery bypass and graft consisting of sequential LIMA to D-2 and LAD, and sequential vein graft to D-1 and ramus intermedius. He has also had PCI of his right coronary artery. Note the patient had a CT scan of his chest in March of 2012 that was poor quality but showed no large pulmonary embolus. When I saw him previously scheduled a Myoview which was performed in May 2012. His ejection fraction was 48%. 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 Since I last saw him he does note dyspnea on exertion but denies exertional chest pain. There is no orthopnea, PT, pedal edema or syncope. He is not taking his lisinopril daily and did not start a statin.   Current Outpatient Prescriptions  Medication Sig Dispense Refill  . aspirin 81 MG tablet Take 81 mg by mouth as needed.       Marland Kitchen lisinopril (PRINIVIL,ZESTRIL) 10 MG tablet 1 tab when pt remebers       . tadalafil (CIALIS) 10 MG tablet Take 10 mg by mouth daily as needed.        Marland Kitchen DISCONTD: lisinopril (PRINIVIL,ZESTRIL) 10 MG tablet Take 1 tablet (10 mg total) by mouth daily.  30 tablet  11  . DISCONTD: simvastatin (ZOCOR) 40 MG tablet Take 1 tablet (40 mg total) by mouth at bedtime.  30 tablet  11     Past Medical History  Diagnosis Date  . CAD (coronary artery disease)   . ED (erectile dysfunction)   . Hypertension   . Nephrolithiasis     Past Surgical History  Procedure Date  . Coronary artery bypass graft 1994  . Surgery for nephrolithiasis     History   Social History  . Marital Status: Divorced    Spouse Name: N/A    Number of Children: 3  . Years of Education: N/A   Occupational History  . Not on file.   Social History Main  Topics  . Smoking status: Never Smoker   . Smokeless tobacco: Not on file  . Alcohol Use: No  . Drug Use: No  . Sexually Active: Not on file   Other Topics Concern  . Not on file   Social History Narrative   Twenty years ago CAD and CABG,no chest pain since per patient but was eval., In ED for chest pain 1 month prev.-MI  r/o but no stress done. Took meds x1 month after MI but caused ED so stopped them had testosterone check >199,wants cialis or testosterone replacement. "if i can't have sex,life isn't worth living".    ROS: no fevers or chills, productive cough, hemoptysis, dysphasia, odynophagia, melena, hematochezia, dysuria, hematuria, rash, seizure activity, orthopnea, PND, pedal edema, claudication. Remaining systems are negative.  Physical Exam: Well-developed well-nourished in no acute distress.  Skin is warm and dry.  HEENT is normal.  Neck is supple. No thyromegaly.  Chest is clear to auscultation with normal expansion.  Cardiovascular exam is regular rate and rhythm.  Abdominal exam nontender or distended. No masses palpated. Extremities show no edema. neuro grossly intact

## 2010-10-09 ENCOUNTER — Encounter: Payer: Self-pay | Admitting: Cardiology

## 2010-10-09 ENCOUNTER — Other Ambulatory Visit (INDEPENDENT_AMBULATORY_CARE_PROVIDER_SITE_OTHER): Payer: Self-pay | Admitting: *Deleted

## 2010-10-09 ENCOUNTER — Ambulatory Visit (INDEPENDENT_AMBULATORY_CARE_PROVIDER_SITE_OTHER): Payer: Self-pay | Admitting: Cardiology

## 2010-10-09 DIAGNOSIS — R0989 Other specified symptoms and signs involving the circulatory and respiratory systems: Secondary | ICD-10-CM

## 2010-10-09 DIAGNOSIS — I2510 Atherosclerotic heart disease of native coronary artery without angina pectoris: Secondary | ICD-10-CM

## 2010-10-09 DIAGNOSIS — I10 Essential (primary) hypertension: Secondary | ICD-10-CM

## 2010-10-09 LAB — LIPID PANEL
Cholesterol: 136 mg/dL (ref 0–200)
HDL: 38.8 mg/dL — ABNORMAL LOW (ref >39.00)
LDL Cholesterol: 77 mg/dL (ref 0–99)
Total CHOL/HDL Ratio: 4
Triglycerides: 103 mg/dL (ref 0.0–149.0)
VLDL: 20.6 mg/dL (ref 0.0–40.0)

## 2010-10-09 LAB — HEPATIC FUNCTION PANEL
Albumin: 4.7 g/dL (ref 3.5–5.2)
Total Protein: 7.7 g/dL (ref 6.0–8.3)

## 2010-10-09 MED ORDER — ASPIRIN EC 81 MG PO TBEC: 81.0000 mg | DELAYED_RELEASE_TABLET | Freq: Every day | ORAL | Status: AC

## 2010-10-09 NOTE — Assessment & Plan Note (Addendum)
Add aspirin. Continue statin. Check lipids and liver. I have reviewed his Myoview with him. There is a prior infarct with mild peri-infarct ischemia. I do not think it is a high risk study. Is not having angina. We will plan medical therapy at this point.

## 2010-10-09 NOTE — Assessment & Plan Note (Signed)
I have asked him to established primary care physician for this issue and other primary medical needs.

## 2010-10-09 NOTE — Assessment & Plan Note (Signed)
Blood pressure controlled. Continue present medications. 

## 2010-10-09 NOTE — Patient Instructions (Signed)
Your physician has recommended you make the following change in your medication:  Start Aspirin  Your physician recommends that you have lab work today fasting cholesterol  Your physician recommends that you schedule a follow-up appointment in:   6 months with Dr. Jens Som

## 2010-10-09 NOTE — Progress Notes (Signed)
[id number] male with past medical history of coronary artery disease status post coronary artery bypass graft for FU. Previously followed at [facility] heart and vascular. The patient is status post coronary artery bypass and graft consisting of sequential LIMA to D-2 and LAD, and sequential vein graft to D-1 and ramus intermedius. He has also had PCI of his right coronary artery. Note the patient had a CT scan of his chest in March of 2012 that was poor quality but showed no large pulmonary embolus. When I saw him previously in May of 2012 rescheduled a myoview. This showed an ejection fraction of 48%. There was a prior anterior, apical and inferior infarct with mild peri-infarct ischemia. Since he was last seen, the patient denies any dyspnea on exertion, orthopnea, PND, pedal edema, palpitations, syncope or chest pain.    Current Outpatient Prescriptions  Medication Sig Dispense Refill  . amLODipine (NORVASC) 10 MG tablet Take 1 tablet (10 mg total) by mouth daily.  30 tablet  11  . pravastatin (PRAVACHOL) 40 MG tablet Take 1 tablet (40 mg total) by mouth every evening.  30 tablet  11     Past Medical History  Diagnosis Date  . CAD (coronary artery disease)   . ED (erectile dysfunction)   . Hypertension   . Nephrolithiasis     Past Surgical History  Procedure Date  . Coronary artery bypass graft 1994  . Surgery for nephrolithiasis     History   Social History  . Marital Status: Divorced    Spouse Name: N/A    Number of Children: 3  . Years of Education: N/A   Occupational History  . Not on file.   Social History Main Topics  . Smoking status: Never Smoker   . Smokeless tobacco: Not on file  . Alcohol Use: No  . Drug Use: No  . Sexually Active: Not on file   Other Topics Concern  . Not on file   Social History Narrative   Twenty years ago CAD and CABG,no chest pain since per patient but was eval., In ED for chest pain 1 month prev.-MI  r/o but no stress done. Took  meds x1 month after MI but caused ED so stopped them had testosterone check >199,wants cialis or testosterone replacement. "if i can't have sex,life isn't worth living".    ROS: erectile dysfunction and cough but no fevers or chills, productive cough, hemoptysis, dysphasia, odynophagia, melena, hematochezia, dysuria, hematuria, rash, seizure activity, orthopnea, PND, pedal edema, claudication. Remaining systems are negative.  Physical Exam: Well-developed well-nourished in no acute distress.  Skin is warm and dry.  HEENT is normal.  Neck is supple. No thyromegaly.  Chest is clear to auscultation with normal expansion.  Cardiovascular exam is regular rate and rhythm.  Abdominal exam nontender or distended. No masses palpated. Extremities show no edema. neuro grossly intact  ECG NSR, IRBBB, inferior MI, no ST changes

## 2010-10-31 LAB — I-STAT 8, (EC8 V) (CONVERTED LAB)
Acid-Base Excess: 1
Bicarbonate: 25.5 — ABNORMAL HIGH
HCT: 40
Hemoglobin: 13.6
Operator id: 277751
Sodium: 137
TCO2: 27
pCO2, Ven: 40 — ABNORMAL LOW

## 2010-10-31 LAB — CBC
HCT: 38 — ABNORMAL LOW
Hemoglobin: 13.1
MCHC: 34.5
RBC: 4.17 — ABNORMAL LOW
RDW: 12.1

## 2010-10-31 LAB — URINALYSIS, ROUTINE W REFLEX MICROSCOPIC
Bilirubin Urine: NEGATIVE
Glucose, UA: NEGATIVE
Hgb urine dipstick: NEGATIVE
Ketones, ur: NEGATIVE
Nitrite: NEGATIVE
Protein, ur: NEGATIVE
Specific Gravity, Urine: 1.02
Urobilinogen, UA: 1
pH: 5.5

## 2010-10-31 LAB — DIFFERENTIAL
Eosinophils Relative: 2
Lymphocytes Relative: 44
Monocytes Absolute: 0.5
Monocytes Relative: 9
Neutro Abs: 2.3

## 2010-10-31 LAB — POCT CARDIAC MARKERS
CKMB, poc: <1 — ABNORMAL LOW
Troponin i, poc: <0.05

## 2010-10-31 LAB — POCT I-STAT CREATININE: Creatinine, Ser: 1.5

## 2010-11-03 LAB — COMPREHENSIVE METABOLIC PANEL
Albumin: 3.2 — ABNORMAL LOW
Alkaline Phosphatase: 75
BUN: 12
BUN: 14
Calcium: 8.5
Calcium: 8.7
Chloride: 102
Creatinine, Ser: 1.26
GFR calc non Af Amer: 59 — ABNORMAL LOW
Glucose, Bld: 118 — ABNORMAL HIGH
Total Bilirubin: 1.5 — ABNORMAL HIGH
Total Protein: 6.5

## 2010-11-03 LAB — URINALYSIS, ROUTINE W REFLEX MICROSCOPIC
Glucose, UA: NEGATIVE
Ketones, ur: NEGATIVE
Nitrite: NEGATIVE
Nitrite: NEGATIVE
Protein, ur: NEGATIVE
Protein, ur: NEGATIVE

## 2010-11-03 LAB — CBC
HCT: 39.8
HCT: 41.9
Hemoglobin: 13.8
MCHC: 34.6
MCHC: 34.8
MCV: 90.9
MCV: 91.8
Platelets: 193
RDW: 12.6
RDW: 12.7
WBC: 10.2

## 2010-11-03 LAB — DIFFERENTIAL
Basophils Absolute: 0
Basophils Relative: 0
Basophils Relative: 1
Eosinophils Absolute: 0.2
Lymphocytes Relative: 49 — ABNORMAL HIGH
Lymphs Abs: 3.6
Monocytes Relative: 6
Monocytes Relative: 7
Neutro Abs: 2.5
Neutrophils Relative %: 37 — ABNORMAL LOW
Neutrophils Relative %: 42 — ABNORMAL LOW

## 2010-11-03 LAB — LIPASE, BLOOD: Lipase: 23

## 2011-05-07 ENCOUNTER — Emergency Department (INDEPENDENT_AMBULATORY_CARE_PROVIDER_SITE_OTHER): Payer: Self-pay

## 2011-05-07 ENCOUNTER — Emergency Department (HOSPITAL_COMMUNITY)
Admission: EM | Admit: 2011-05-07 | Discharge: 2011-05-07 | Disposition: A | Discharge status: Home / Self Care | Payer: Self-pay | Source: Home / Self Care | Attending: Family Medicine | Admitting: Family Medicine

## 2011-05-07 ENCOUNTER — Encounter (HOSPITAL_COMMUNITY): Payer: Self-pay | Admitting: *Deleted

## 2011-05-07 DIAGNOSIS — H6120 Impacted cerumen, unspecified ear: Secondary | ICD-10-CM

## 2011-05-07 DIAGNOSIS — H918X9 Other specified hearing loss, unspecified ear: Secondary | ICD-10-CM

## 2011-05-07 DIAGNOSIS — K219 Gastro-esophageal reflux disease without esophagitis: Secondary | ICD-10-CM

## 2011-05-07 DIAGNOSIS — R05 Cough: Secondary | ICD-10-CM

## 2011-05-07 MED ORDER — GI COCKTAIL ~~LOC~~
30.0000 mL | Freq: Once | ORAL | Status: AC
  Administered 2011-05-07: 30 mL via ORAL

## 2011-05-07 MED ORDER — RANITIDINE HCL 150 MG PO TABS: 150.0000 mg | ORAL_TABLET | Freq: Two times a day (BID) | ORAL | Status: DC

## 2011-05-07 MED ORDER — GI COCKTAIL ~~LOC~~
ORAL | Status: AC
  Filled 2011-05-07: qty 30

## 2011-05-07 MED ORDER — DOCUSATE SODIUM 50 MG/5ML PO LIQD
ORAL | Status: AC
  Filled 2011-05-07: qty 10

## 2011-05-07 NOTE — Discharge Instructions (Signed)
Use mylanta and zantac for 2 weeks , see your doctor as needed.

## 2011-05-07 NOTE — ED Notes (Signed)
Pt  Reports  Symptoms  Of  Cough  sorethroat  Body  Aches   As   Well    As  Gagging  At  Intervals     - pt is  Sitting  Upright on  Exam table   Appears in no  Acute  Distress     Pt  Reports  The  Symptoms  X  30  Days      He  Is  Masked  And  Is  In a  Private  Room     He  Is  Speaking in  Complete  sentances

## 2011-05-07 NOTE — ED Provider Notes (Addendum)
History     CSN: [id number]  Arrival date & time 05/07/11  1431   First MD Initiated Contact with Patient 05/07/11 1605      Chief Complaint  Patient presents with  . Cough    (Consider location/radiation/quality/duration/timing/severity/associated sxs/prior treatment) Patient is a 61 y.o. male presenting with cough. The history is provided by the patient.  Cough This is a new problem. The current episode started more than 1 week ago (sx for 30 days using tussionex from daughter with temp relief.,also reports lot of acid reflux sx.). The problem has not changed since onset.The cough is non-productive. Associated symptoms include ear congestion, ear pain, rhinorrhea, sore throat and shortness of breath. He has tried cough syrup for the symptoms. The treatment provided mild relief. He is not a smoker.    Past Medical History  Diagnosis Date  . CAD (coronary artery disease)   . ED (erectile dysfunction)   . Hypertension   . Nephrolithiasis     Past Surgical History  Procedure Date  . Coronary artery bypass graft 1994  . Surgery for nephrolithiasis     Family History  Problem Relation Age of Onset  . Coronary artery disease Mother     Died at age 68 of MI  . Coronary artery disease Father     MI age 21    History  Substance Use Topics  . Smoking status: Never Smoker   . Smokeless tobacco: Not on file  . Alcohol Use: No      Review of Systems  Constitutional: Negative.   HENT: Positive for ear pain, congestion, sore throat, rhinorrhea and tinnitus.   Respiratory: Positive for cough, choking and shortness of breath.     Allergies  Ciprofloxacin hcl and Peanut-containing drug products  Home Medications   Current Outpatient Rx  Name Route Sig Dispense Refill  . AMLODIPINE BESYLATE 10 MG PO TABS Oral Take 1 tablet (10 mg total) by mouth daily. 30 tablet 11  . ASPIRIN EC 81 MG PO TBEC Oral Take 1 tablet (81 mg total) by mouth daily. 150 tablet 2  . PRAVASTATIN  SODIUM 40 MG PO TABS Oral Take 1 tablet (40 mg total) by mouth every evening. 30 tablet 11  . RANITIDINE HCL 150 MG PO TABS Oral Take 1 tablet (150 mg total) by mouth 2 (two) times daily. 60 tablet 0    BP 149/88  Pulse 75  Temp(Src) 98.6 F (37 C) (Oral)  Resp 18  SpO2 96%  Physical Exam  Nursing note and vitals reviewed. Constitutional: He appears well-developed and well-nourished.       Spasmodic cough with gagging, nonproductive.  HENT:  Head: Normocephalic.  Right Ear: External ear normal.  Left Ear: External ear normal.  Mouth/Throat: Oropharynx is clear and moist.       Cerumen impaction right ear.  Neck: Normal range of motion. Neck supple.  Cardiovascular: Normal rate, regular rhythm, normal heart sounds and intact distal pulses.   Pulmonary/Chest: Effort normal and breath sounds normal. He exhibits tenderness.  Lymphadenopathy:    He has no cervical adenopathy.  Skin: Skin is warm and dry.    ED Course  Procedures (including critical care time)  Labs Reviewed - No data to display Dg Chest 2 View  05/07/2011  *RADIOLOGY REPORT*  Clinical Data: Cough, fever  CHEST - 2 VIEW  Comparison: 04/28/2010  Findings: Normal heart size post CABG. Mediastinal contours and pulmonary vascularity normal. Lungs clear. No pleural effusion or pneumothorax. Bones unremarkable.  IMPRESSION: No acute abnormalities.  Original Report Authenticated By: Lollie Marrow, M.D.     No diagnosis found.    MDM  X-rays reviewed and report per radiologist.  Sx much improved with gi cocktail , ear sx improved after irrigation and removal of cerumen.         Linna Hoff, MD 05/07/11 1610  Linna Hoff, MD 05/07/11 2211

## 2012-11-01 ENCOUNTER — Encounter (HOSPITAL_COMMUNITY): Payer: Self-pay | Admitting: Emergency Medicine

## 2012-11-01 ENCOUNTER — Emergency Department (INDEPENDENT_AMBULATORY_CARE_PROVIDER_SITE_OTHER)
Admission: EM | Admit: 2012-11-01 | Discharge: 2012-11-01 | Disposition: A | Discharge status: Home / Self Care | Payer: No Typology Code available for payment source | Source: Home / Self Care | Attending: Family Medicine | Admitting: Family Medicine

## 2012-11-01 DIAGNOSIS — N341 Nonspecific urethritis: Secondary | ICD-10-CM

## 2012-11-01 MED ORDER — CEFTRIAXONE SODIUM 1 G IJ SOLR
INTRAMUSCULAR | Status: AC
  Filled 2012-11-01: qty 10

## 2012-11-01 MED ORDER — AZITHROMYCIN 250 MG PO TABS
1000.0000 mg | ORAL_TABLET | Freq: Once | ORAL | Status: AC
  Administered 2012-11-01: 1000 mg via ORAL

## 2012-11-01 MED ORDER — LIDOCAINE HCL (PF) 1 % IJ SOLN
INTRAMUSCULAR | Status: AC
  Filled 2012-11-01: qty 5

## 2012-11-01 MED ORDER — AZITHROMYCIN 250 MG PO TABS
ORAL_TABLET | ORAL | Status: AC
  Filled 2012-11-01: qty 4

## 2012-11-01 MED ORDER — CEFTRIAXONE SODIUM 1 G IJ SOLR
250.0000 mg | Freq: Once | INTRAMUSCULAR | Status: AC
  Administered 2012-11-01: 250 mg via INTRAMUSCULAR

## 2012-11-01 NOTE — ED Provider Notes (Signed)
CSN: [id number]     Arrival date & time 11/01/12  1503 History   First MD Initiated Contact with Patient 11/01/12 1604     No chief complaint on file.  (Consider location/radiation/quality/duration/timing/severity/associated sxs/prior Treatment) Patient is a 62 y.o. male presenting with STD exposure. The history is provided by the patient.  Exposure to STD This is a new problem. The current episode started more than 1 week ago (unprotected sexual encounter with urethral sx sev days later.). The problem has not changed since onset.Pertinent negatives include no abdominal pain.    Past Medical History  Diagnosis Date  . CAD (coronary artery disease)   . ED (erectile dysfunction)   . Hypertension   . Nephrolithiasis    Past Surgical History  Procedure Laterality Date  . Coronary artery bypass graft  1994  . Surgery for nephrolithiasis     Family History  Problem Relation Age of Onset  . Coronary artery disease Mother     Died at age 92 of MI  . Coronary artery disease Father     MI age 43   History  Substance Use Topics  . Smoking status: Never Smoker   . Smokeless tobacco: Not on file  . Alcohol Use: No    Review of Systems  Constitutional: Negative.   Gastrointestinal: Negative.  Negative for abdominal pain.  Genitourinary: Positive for dysuria, discharge and penile pain. Negative for penile swelling, scrotal swelling, genital sores and testicular pain.  Hematological: Negative for adenopathy.    Allergies  Ciprofloxacin hcl and Peanut-containing drug products  Home Medications   Current Outpatient Rx  Name  Route  Sig  Dispense  Refill  . EXPIRED: amLODipine (NORVASC) 10 MG tablet   Oral   Take 1 tablet (10 mg total) by mouth daily.   30 tablet   11   . EXPIRED: pravastatin (PRAVACHOL) 40 MG tablet   Oral   Take 1 tablet (40 mg total) by mouth every evening.   30 tablet   11   . EXPIRED: ranitidine (ZANTAC) 150 MG tablet   Oral   Take 1 tablet (150 mg  total) by mouth 2 (two) times daily.   60 tablet   0    BP 148/87  Pulse 71  Temp(Src) 97.9 F (36.6 C) (Oral)  SpO2 95% Physical Exam  Nursing note and vitals reviewed. Constitutional: He is oriented to person, place, and time. He appears well-developed and well-nourished.  Abdominal: Soft. Bowel sounds are normal.  Genitourinary: Penile tenderness present.  Musculoskeletal: Normal range of motion.  Neurological: He is alert and oriented to person, place, and time.  Skin: Skin is warm and dry.    ED Course  Procedures (including critical care time) Labs Review Labs Reviewed  GC/CHLAMYDIA PROBE AMP   Imaging Review No results found.  MDM      Linna Hoff, MD 11/01/12 [phone]

## 2012-11-01 NOTE — ED Notes (Signed)
Provider in before nurse. Pt assessed and triaged by provider.

## 2012-11-01 NOTE — ED Notes (Signed)
Given injection will discharge at 4:55 p.m mw,cma

## 2012-11-02 ENCOUNTER — Other Ambulatory Visit (HOSPITAL_COMMUNITY)
Admission: RE | Admit: 2012-11-02 | Discharge: 2012-11-02 | Disposition: A | Discharge status: Home / Self Care | Payer: No Typology Code available for payment source | Source: Ambulatory Visit | Attending: Family Medicine | Admitting: Family Medicine

## 2012-11-02 DIAGNOSIS — Z113 Encounter for screening for infections with a predominantly sexual mode of transmission: Secondary | ICD-10-CM | POA: Insufficient documentation

## 2012-11-03 ENCOUNTER — Encounter (HOSPITAL_COMMUNITY): Payer: Self-pay | Admitting: Emergency Medicine

## 2012-11-03 ENCOUNTER — Other Ambulatory Visit (HOSPITAL_COMMUNITY)
Admission: RE | Admit: 2012-11-03 | Discharge: 2012-11-03 | Disposition: A | Discharge status: Home / Self Care | Payer: No Typology Code available for payment source | Source: Ambulatory Visit | Attending: Family Medicine | Admitting: Family Medicine

## 2012-11-03 ENCOUNTER — Emergency Department (INDEPENDENT_AMBULATORY_CARE_PROVIDER_SITE_OTHER)
Admission: EM | Admit: 2012-11-03 | Discharge: 2012-11-03 | Disposition: A | Discharge status: Home / Self Care | Payer: No Typology Code available for payment source | Source: Home / Self Care | Attending: Family Medicine | Admitting: Family Medicine

## 2012-11-03 DIAGNOSIS — Z113 Encounter for screening for infections with a predominantly sexual mode of transmission: Secondary | ICD-10-CM | POA: Insufficient documentation

## 2012-11-03 DIAGNOSIS — N341 Nonspecific urethritis: Secondary | ICD-10-CM

## 2012-11-03 DIAGNOSIS — H109 Unspecified conjunctivitis: Secondary | ICD-10-CM

## 2012-11-03 MED ORDER — DOXYCYCLINE HYCLATE 100 MG PO CAPS: 100.0000 mg | ORAL_CAPSULE | Freq: Two times a day (BID) | ORAL | Status: DC

## 2012-11-03 MED ORDER — ERYTHROMYCIN 5 MG/GM OP OINT: TOPICAL_OINTMENT | OPHTHALMIC | Status: DC

## 2012-11-03 NOTE — ED Notes (Signed)
Call back number verified.  

## 2012-11-03 NOTE — ED Notes (Signed)
Pt is here for poss pink eye on both eyes onset Monday Sxs include: itching, burning, water... Believes it maybe STD ralated Reports he was seen here on Tuesday, 9/23, and treated for Chlamydia... Given Rocephin 250mg  and Azithromycin 1g  Still having dysuria and wants to be re-treated.  Alert w/no signs of acute distress.

## 2012-11-03 NOTE — ED Provider Notes (Signed)
CSN: [id number]     Arrival date & time 11/03/12  1525 History   None    Chief Complaint  Patient presents with  . Conjunctivitis   (Consider location/radiation/quality/duration/timing/severity/associated sxs/prior Treatment) Patient is a 62 y.o. male presenting with conjunctivitis. The history is provided by the patient.  Conjunctivitis This is a new problem. The current episode started more than 2 days ago. The problem has been gradually worsening. Associated symptoms comments: Dysuria continues as on mon, no d/c.Marland Kitchen    Past Medical History  Diagnosis Date  . CAD (coronary artery disease)   . ED (erectile dysfunction)   . Hypertension   . Nephrolithiasis    Past Surgical History  Procedure Laterality Date  . Coronary artery bypass graft  1994  . Surgery for nephrolithiasis     Family History  Problem Relation Age of Onset  . Coronary artery disease Mother     Died at age 69 of MI  . Coronary artery disease Father     MI age 40   History  Substance Use Topics  . Smoking status: Never Smoker   . Smokeless tobacco: Not on file  . Alcohol Use: No    Review of Systems  Constitutional: Negative.   Eyes: Positive for discharge and redness. Negative for photophobia, pain and visual disturbance.  Genitourinary: Positive for dysuria.    Allergies  Ciprofloxacin hcl and Peanut-containing drug products  Home Medications   Current Outpatient Rx  Name  Route  Sig  Dispense  Refill  . EXPIRED: amLODipine (NORVASC) 10 MG tablet   Oral   Take 1 tablet (10 mg total) by mouth daily.   30 tablet   11   . doxycycline (VIBRAMYCIN) 100 MG capsule   Oral   Take 1 capsule (100 mg total) by mouth 2 (two) times daily.   20 capsule   0   . erythromycin ophthalmic ointment      Apply to both eyes qid   3.5 g   1   . EXPIRED: pravastatin (PRAVACHOL) 40 MG tablet   Oral   Take 1 tablet (40 mg total) by mouth every evening.   30 tablet   11   . EXPIRED: ranitidine (ZANTAC)  150 MG tablet   Oral   Take 1 tablet (150 mg total) by mouth 2 (two) times daily.   60 tablet   0    BP 131/81  Pulse 69  Temp(Src) 98.4 F (36.9 C) (Oral)  Resp 18  SpO2 96% Physical Exam  Nursing note and vitals reviewed. Eyes: EOM and lids are normal. Pupils are equal, round, and reactive to light. Right conjunctiva is injected. Right conjunctiva has no hemorrhage. Left conjunctiva is injected. Left conjunctiva has no hemorrhage.    ED Course  Procedures (including critical care time) Labs Review Labs Reviewed  URINE CYTOLOGY ANCILLARY ONLY   Imaging Review No results found.  MDM  Gc/chlamy probe from 9/23 were neg.   Linna Hoff, MD 11/03/12 [phone]

## 2015-02-14 ENCOUNTER — Telehealth: Payer: Self-pay | Admitting: Cardiology

## 2015-02-14 NOTE — Telephone Encounter (Signed)
Received records from [facility] for appointment with Dr Jens Somrenshaw on 02/22/15.  [facility] Dr Ludwig Clarksrenshaw's schedule on 02/22/15. lp

## 2015-02-18 ENCOUNTER — Telehealth: Payer: Self-pay | Admitting: Physician Assistant

## 2015-02-18 ENCOUNTER — Encounter (HOSPITAL_COMMUNITY): Payer: Self-pay | Admitting: Family Medicine

## 2015-02-18 ENCOUNTER — Inpatient Hospital Stay (HOSPITAL_COMMUNITY)
Admission: EM | Admit: 2015-02-18 | Discharge: 2015-02-20 | DRG: 247 | Disposition: A | Discharge status: Home / Self Care | Payer: Self-pay | Attending: Cardiology | Admitting: Cardiology

## 2015-02-18 ENCOUNTER — Emergency Department (HOSPITAL_COMMUNITY): Payer: Self-pay

## 2015-02-18 DIAGNOSIS — I214 Non-ST elevation (NSTEMI) myocardial infarction: Principal | ICD-10-CM

## 2015-02-18 DIAGNOSIS — I252 Old myocardial infarction: Secondary | ICD-10-CM

## 2015-02-18 DIAGNOSIS — N183 Chronic kidney disease, stage 3 (moderate): Secondary | ICD-10-CM | POA: Diagnosis present

## 2015-02-18 DIAGNOSIS — I25110 Atherosclerotic heart disease of native coronary artery with unstable angina pectoris: Secondary | ICD-10-CM | POA: Diagnosis present

## 2015-02-18 DIAGNOSIS — I255 Ischemic cardiomyopathy: Secondary | ICD-10-CM | POA: Diagnosis present

## 2015-02-18 DIAGNOSIS — Z8249 Family history of ischemic heart disease and other diseases of the circulatory system: Secondary | ICD-10-CM

## 2015-02-18 DIAGNOSIS — Z955 Presence of coronary angioplasty implant and graft: Secondary | ICD-10-CM

## 2015-02-18 DIAGNOSIS — Z79899 Other long term (current) drug therapy: Secondary | ICD-10-CM

## 2015-02-18 DIAGNOSIS — I25700 Atherosclerosis of coronary artery bypass graft(s), unspecified, with unstable angina pectoris: Secondary | ICD-10-CM

## 2015-02-18 DIAGNOSIS — Z6834 Body mass index (BMI) 34.0-34.9, adult: Secondary | ICD-10-CM

## 2015-02-18 DIAGNOSIS — Z881 Allergy status to other antibiotic agents status: Secondary | ICD-10-CM

## 2015-02-18 DIAGNOSIS — I4510 Unspecified right bundle-branch block: Secondary | ICD-10-CM | POA: Diagnosis present

## 2015-02-18 DIAGNOSIS — E669 Obesity, unspecified: Secondary | ICD-10-CM | POA: Diagnosis present

## 2015-02-18 DIAGNOSIS — N182 Chronic kidney disease, stage 2 (mild): Secondary | ICD-10-CM

## 2015-02-18 DIAGNOSIS — R7303 Prediabetes: Secondary | ICD-10-CM | POA: Diagnosis present

## 2015-02-18 DIAGNOSIS — I129 Hypertensive chronic kidney disease with stage 1 through stage 4 chronic kidney disease, or unspecified chronic kidney disease: Secondary | ICD-10-CM | POA: Diagnosis present

## 2015-02-18 DIAGNOSIS — I2510 Atherosclerotic heart disease of native coronary artery without angina pectoris: Secondary | ICD-10-CM | POA: Diagnosis present

## 2015-02-18 DIAGNOSIS — I10 Essential (primary) hypertension: Secondary | ICD-10-CM | POA: Diagnosis present

## 2015-02-18 DIAGNOSIS — Z91010 Allergy to peanuts: Secondary | ICD-10-CM

## 2015-02-18 DIAGNOSIS — N529 Male erectile dysfunction, unspecified: Secondary | ICD-10-CM | POA: Diagnosis present

## 2015-02-18 DIAGNOSIS — I200 Unstable angina: Secondary | ICD-10-CM

## 2015-02-18 DIAGNOSIS — Z951 Presence of aortocoronary bypass graft: Secondary | ICD-10-CM

## 2015-02-18 DIAGNOSIS — E785 Hyperlipidemia, unspecified: Secondary | ICD-10-CM | POA: Diagnosis present

## 2015-02-18 HISTORY — DX: Acute myocardial infarction, unspecified: I21.9

## 2015-02-18 HISTORY — DX: Hyperlipidemia, unspecified: E78.5

## 2015-02-18 HISTORY — DX: Prediabetes: R73.03

## 2015-02-18 HISTORY — DX: Obesity, unspecified: E66.9

## 2015-02-18 HISTORY — DX: Ischemic cardiomyopathy: I25.5

## 2015-02-18 HISTORY — DX: Chronic kidney disease, stage 2 (mild): N18.2

## 2015-02-18 LAB — BRAIN NATRIURETIC PEPTIDE: B Natriuretic Peptide: 172.9 pg/mL — ABNORMAL HIGH (ref 0.0–100.0)

## 2015-02-18 LAB — HEPATIC FUNCTION PANEL
ALBUMIN: 3.9 g/dL (ref 3.5–5.0)
ALT: 19 U/L (ref 17–63)
AST: 28 U/L (ref 15–41)
Alkaline Phosphatase: 53 U/L (ref 38–126)
Bilirubin, Direct: 0.2 mg/dL (ref 0.1–0.5)
Indirect Bilirubin: 0.9 mg/dL (ref 0.3–0.9)
TOTAL PROTEIN: 6.8 g/dL (ref 6.5–8.1)
Total Bilirubin: 1.1 mg/dL (ref 0.3–1.2)

## 2015-02-18 LAB — CBC
HCT: 43.4 % (ref 39.0–52.0)
Hemoglobin: 14.8 g/dL (ref 13.0–17.0)
MCH: 31.8 pg (ref 26.0–34.0)
MCHC: 34.1 g/dL (ref 30.0–36.0)
MCV: 93.3 fL (ref 78.0–100.0)
Platelets: 171 10*3/uL (ref 150–400)
RBC: 4.65 MIL/uL (ref 4.22–5.81)
RDW: 12.6 % (ref 11.5–15.5)
WBC: 9.6 10*3/uL (ref 4.0–10.5)

## 2015-02-18 LAB — TROPONIN I
Troponin I: 0.11 ng/mL — ABNORMAL HIGH (ref <0.031)
Troponin I: 0.11 ng/mL — ABNORMAL HIGH (ref <0.031)

## 2015-02-18 LAB — BASIC METABOLIC PANEL
Anion gap: 10 (ref 5–15)
BUN: 15 mg/dL (ref 6–20)
CALCIUM: 9.6 mg/dL (ref 8.9–10.3)
CO2: 26 mmol/L (ref 22–32)
CREATININE: 1.29 mg/dL — AB (ref 0.61–1.24)
Chloride: 103 mmol/L (ref 101–111)
GFR calc Af Amer: >60 mL/min (ref >60)
GFR, EST NON AFRICAN AMERICAN: 56 mL/min — AB (ref >60)
Glucose, Bld: 112 mg/dL — ABNORMAL HIGH (ref 65–99)
Potassium: 4.8 mmol/L (ref 3.5–5.1)
Sodium: 139 mmol/L (ref 135–145)

## 2015-02-18 LAB — TSH: TSH: 2.053 u[IU]/mL (ref 0.350–4.500)

## 2015-02-18 LAB — PROTIME-INR
INR: 1.17 (ref 0.00–1.49)
Prothrombin Time: 15 seconds (ref 11.6–15.2)

## 2015-02-18 LAB — I-STAT TROPONIN, ED: Troponin i, poc: 0.08 ng/mL (ref 0.00–0.08)

## 2015-02-18 LAB — HEPARIN LEVEL (UNFRACTIONATED): Heparin Unfractionated: 0.36 IU/mL (ref 0.30–0.70)

## 2015-02-18 MED ORDER — ACETAMINOPHEN 325 MG PO TABS
650.0000 mg | ORAL_TABLET | ORAL | Status: DC | PRN
  Administered 2015-02-18: 650 mg via ORAL
  Filled 2015-02-18: qty 2

## 2015-02-18 MED ORDER — SODIUM CHLORIDE 0.9 % IJ SOLN
3.0000 mL | Freq: Two times a day (BID) | INTRAMUSCULAR | Status: DC
  Administered 2015-02-19: 3 mL via INTRAVENOUS

## 2015-02-18 MED ORDER — ASPIRIN EC 81 MG PO TBEC
81.0000 mg | DELAYED_RELEASE_TABLET | Freq: Every day | ORAL | Status: DC
  Administered 2015-02-20: 81 mg via ORAL
  Filled 2015-02-18: qty 1

## 2015-02-18 MED ORDER — ASPIRIN 81 MG PO CHEW
324.0000 mg | CHEWABLE_TABLET | Freq: Once | ORAL | Status: AC
  Administered 2015-02-18: 324 mg via ORAL
  Filled 2015-02-18: qty 4

## 2015-02-18 MED ORDER — ATORVASTATIN CALCIUM 40 MG PO TABS
40.0000 mg | ORAL_TABLET | Freq: Every day | ORAL | Status: DC
  Administered 2015-02-18 – 2015-02-19 (×2): 40 mg via ORAL
  Filled 2015-02-18 (×2): qty 1

## 2015-02-18 MED ORDER — SODIUM CHLORIDE 0.9 % IJ SOLN: 3.0000 mL | INTRAMUSCULAR | Status: DC | PRN

## 2015-02-18 MED ORDER — NITROGLYCERIN 2 % TD OINT
1.0000 [in_us] | TOPICAL_OINTMENT | Freq: Four times a day (QID) | TRANSDERMAL | Status: DC | PRN
  Filled 2015-02-18: qty 30

## 2015-02-18 MED ORDER — SODIUM CHLORIDE 0.9 % IV SOLN: 250.0000 mL | INTRAVENOUS | Status: DC | PRN

## 2015-02-18 MED ORDER — ONDANSETRON HCL 4 MG/2ML IJ SOLN: 4.0000 mg | Freq: Four times a day (QID) | INTRAMUSCULAR | Status: DC | PRN

## 2015-02-18 MED ORDER — NITROGLYCERIN 0.4 MG SL SUBL: 0.4000 mg | SUBLINGUAL_TABLET | SUBLINGUAL | Status: DC | PRN

## 2015-02-18 MED ORDER — NITROGLYCERIN 2 % TD OINT
1.0000 [in_us] | TOPICAL_OINTMENT | Freq: Once | TRANSDERMAL | Status: AC
  Administered 2015-02-18: 1 [in_us] via TOPICAL
  Filled 2015-02-18: qty 1

## 2015-02-18 MED ORDER — ASPIRIN 81 MG PO CHEW
81.0000 mg | CHEWABLE_TABLET | ORAL | Status: AC
  Administered 2015-02-19: 81 mg via ORAL
  Filled 2015-02-18: qty 1

## 2015-02-18 MED ORDER — SODIUM CHLORIDE 0.9 % WEIGHT BASED INFUSION: 1.0000 mL/kg/h | INTRAVENOUS | Status: DC

## 2015-02-18 MED ORDER — HEPARIN (PORCINE) IN NACL 100-0.45 UNIT/ML-% IJ SOLN
1300.0000 [IU]/h | INTRAMUSCULAR | Status: DC
  Administered 2015-02-18: 1300 [IU]/h via INTRAVENOUS
  Filled 2015-02-18: qty 250

## 2015-02-18 MED ORDER — HEPARIN BOLUS VIA INFUSION
4000.0000 [IU] | Freq: Once | INTRAVENOUS | Status: AC
  Administered 2015-02-18: 4000 [IU] via INTRAVENOUS
  Filled 2015-02-18: qty 4000

## 2015-02-18 MED ORDER — SODIUM CHLORIDE 0.9 % WEIGHT BASED INFUSION
3.0000 mL/kg/h | INTRAVENOUS | Status: DC
  Administered 2015-02-19: 3 mL/kg/h via INTRAVENOUS

## 2015-02-18 NOTE — ED Provider Notes (Signed)
The patient is a 65 year old male, he presents to the hospital with what has been several days of chest pain after he started shoveling snow, having severe chest pain with radiation to both arms. At this time he feels much better but has had some waxing and waning exertional symptoms over the last couple of days.  On exam the patient is in no distress, no edema, clear lung sounds, no murmurs, normal rate, normal pulses, no JVD.  EKG shows no signs of acute ischemia  Labs unremarkable, baseline renal function.  Cardiology consult.  Medical screening examination/treatment/procedure(s) were conducted as a shared visit with non-physician practitioner(s) and myself.  I personally evaluated the patient during the encounter.  Clinical Impression:   Final diagnoses:  Unstable angina (HCC)         Eber HongBrian Tennille Montelongo, MD 02/19/15 1200

## 2015-02-18 NOTE — ED Notes (Signed)
MD at bedside. 

## 2015-02-18 NOTE — Progress Notes (Signed)
ANTICOAGULATION CONSULT NOTE - Initial Consult  Pharmacy Consult for heparin Indication: chest pain/ACS  Allergies  Allergen Reactions  . Ciprofloxacin Hcl     Unknown   . Peanut-Containing Drug Products     Unknown     Patient Measurements: Height: 5\' 10"  (177.8 cm) Weight: 240 lb (108.863 kg) IBW/kg (Calculated) : 73 Heparin Dosing Weight:  96kg  Vital Signs: Temp: 97.9 F (36.6 C) (01/09 1640) Temp Source: Oral (01/09 1640) BP: 119/96 mmHg (01/09 1640) Pulse Rate: 83 (01/09 1640)  Labs:  Recent Labs  02/18/15 1319  HGB 14.8  HCT 43.4  PLT 171  CREATININE 1.29*    Estimated Creatinine Clearance: 69.6 mL/min (by C-G formula based on Cr of 1.29).   Medical History: Past Medical History  Diagnosis Date  . CAD (coronary artery disease)     a. s/p CABG 1994 with LIMA to D2 and LAD, SVG to D1 and ramus intermedius. b. acute inferior MI 2001 [facility]  . ED (erectile dysfunction)   . Hypertension   . Nephrolithiasis   . Ischemic cardiomyopathy     a. EF 48% by nuc in 2012.  . Obesity   . CKD (chronic kidney disease), stage II     Assessment: [age] YOM here with chest pain to start heparin. He is not on anticoagulation at home. Baseline Hgb 14.8, plts 171- no bleed noted.  Goal of Therapy:  Heparin level 0.3-0.7 units/ml Monitor platelets by anticoagulation protocol: Yes   Plan:  -heparin bolus with  4000 units IV x1, then start infusion at 1300 units/hr -Heparin level in 6h -daily HL and CBC -follow cards plans (cath tomorrow)  Leotis ShamesLauren D. Cintya Daughety, PharmD, BCPS Clinical Pharmacist Pager: [phone] 02/18/2015 4:52 PM

## 2015-02-18 NOTE — ED Notes (Signed)
Accepting nurse cannot receive report at this time. She will call back

## 2015-02-18 NOTE — Progress Notes (Signed)
HPI: 65 yo male for evaluation of coronary artery disease. The patient is status post coronary artery bypass and graft consisting of sequential LIMA to D-2 and LAD, and sequential vein graft to D-1 and ramus intermedius. He has also had PCI of his right coronary artery. Nuclear study 2012 showed an ejection fraction of 48%. There was a prior anterior, apical and inferior infarct with mild peri-infarct ischemia. Patient not seen since 2012.  Current Outpatient Prescriptions  Medication Sig Dispense Refill  . amLODipine (NORVASC) 10 MG tablet Take 1 tablet (10 mg total) by mouth daily. 30 tablet 11  . lisinopril (PRINIVIL,ZESTRIL) 10 MG tablet Take 10 mg by mouth daily.    . pravastatin (PRAVACHOL) 40 MG tablet Take 1 tablet (40 mg total) by mouth every evening. 30 tablet 11  . pravastatin (PRAVACHOL) 40 MG tablet Take 40 mg by mouth daily.    . ranitidine (ZANTAC) 150 MG tablet Take 1 tablet (150 mg total) by mouth 2 (two) times daily. 60 tablet 0   No current facility-administered medications for this visit.    Allergies  Allergen Reactions  . Ciprofloxacin Hcl     Unknown   . Peanut-Containing Drug Products     Unknown     Past Medical History  Diagnosis Date  . CAD (coronary artery disease)     a. s/p CABG 1994 with LIMA to D2 and LAD, SVG to D1 and ramus intermedius. b. acute inferior MI 2001 [facility]  . ED (erectile dysfunction)   . Hypertension   . Nephrolithiasis   . Ischemic cardiomyopathy     a. EF 48% by nuc in 2012.  . Obesity     Past Surgical History  Procedure Laterality Date  . Coronary artery bypass graft  1994  . Surgery for nephrolithiasis      Social History   Social History  . Marital Status: Divorced    Spouse Name: N/A  . Number of Children: 3  . Years of Education: N/A   Occupational History  . Not on file.   Social History Main Topics  . Smoking status: Never Smoker   . Smokeless tobacco: Not on file  . Alcohol  Use: No  . Drug Use: No  . Sexual Activity: Yes   Other Topics Concern  . Not on file   Social History Narrative   Twenty years ago CAD and CABG,no chest pain since per patient but was eval., In ED for chest pain 1 month prev.-MI  r/o but no stress done. Took meds x1 month after MI but caused ED so stopped them had testosterone check >199,wants cialis or testosterone replacement. "if i can't have sex,life isn't worth living".    Family History  Problem Relation Age of Onset  . Coronary artery disease Mother     Died at age 67 of MI  . Coronary artery disease Father     MI age 67    ROS: no fevers or chills, productive cough, hemoptysis, dysphasia, odynophagia, melena, hematochezia, dysuria, hematuria, rash, seizure activity, orthopnea, PND, pedal edema, claudication. Remaining systems are negative.  Physical Exam:   There were no vitals taken for this visit.  General:  Well developed/well nourished in NAD Skin warm/dry Patient not depressed No peripheral clubbing Back-normal HEENT-normal/normal eyelids Neck supple/normal carotid upstroke bilaterally; no bruits; no JVD; no thyromegaly chest - CTA/ normal expansion CV - RRR/normal S1 and S2; no murmurs, rubs or gallops;  PMI nondisplaced Abdomen -NT/ND,  no HSM, no mass, + bowel sounds, no bruit 2+ femoral pulses, no bruits Ext-no edema, chords, 2+ DP Neuro-grossly nonfocal  ECG    This encounter was created in error - please disregard.

## 2015-02-18 NOTE — ED Notes (Signed)
Luanna ColeBriana, RN on 2W accepts report at this time

## 2015-02-18 NOTE — Progress Notes (Signed)
ANTICOAGULATION CONSULT NOTE Pharmacy Consult for heparin Indication: chest pain/ACS  Allergies  Allergen Reactions  . Ciprofloxacin Hcl     Unknown   . Peanut-Containing Drug Products     Unknown     Patient Measurements: Height: 5\' 10"  (177.8 cm) Weight: 240 lb (108.863 kg) IBW/kg (Calculated) : 73 Heparin Dosing Weight:  96kg  Vital Signs: Temp: 97.6 F (36.4 C) (01/09 2119) Temp Source: Oral (01/09 2119) BP: 130/76 mmHg (01/09 2119) Pulse Rate: 74 (01/09 2119)  Labs:  Recent Labs  02/18/15 1319 02/18/15 1724 02/18/15 2237  HGB 14.8  --   --   HCT 43.4  --   --   PLT 171  --   --   LABPROT  --  15.0  --   INR  --  1.17  --   HEPARINUNFRC  --   --  0.36  CREATININE 1.29*  --   --   TROPONINI  --  0.11* 0.11*    Estimated Creatinine Clearance: 69.6 mL/min (by C-G formula based on Cr of 1.29).  Assessment: 65 y.o. male with chest pain for heparin   Goal of Therapy:  Heparin level 0.3-0.7 units/ml Monitor platelets by anticoagulation protocol: Yes   Plan:  Continue Heparin at current rate for now Follow-up am labs.  Geannie RisenGreg Jayson Waterhouse, PharmD, BCPS

## 2015-02-18 NOTE — ED Notes (Signed)
PT reports at this time his pain is 3/10 and he has no SOB or other associated symptoms. PT reports the severe pain occurred over the weekend when he was shoveling snow. PT has an extensive cardiac history, but has not seen cardiology in 16 years.

## 2015-02-18 NOTE — ED Provider Notes (Signed)
CSN: [id number]     Arrival date & time 02/18/15  1308 History   First MD Initiated Contact with Patient 02/18/15 1328     Chief Complaint  Patient presents with  . Chest Pain     (Consider location/radiation/quality/duration/timing/severity/associated sxs/prior Treatment) HPI William Matthews is a 65 y.o. male with known coronary artery disease, CABG and stent placement, comes in for by which chest pain. Patient reports on Saturday while shoveling snow he experienced sudden onset chest pain "like a truck was backing over my chest". Discomfort radiated into left arm. He reports having the symptoms before when he had MI. He reports he will generally experience the symptoms with strenuous activity but they resolve with rest. He reports this time he only minimally exert himself and the chest pain was more intense. Chest discomfort persists now in the ED and is rated as 2/10. He reports associated mild shortness of breath but denies any nausea, vomiting, diaphoresis. States he has sublingual nitroglycerin, but he did not take anything prior to arrival. He reports calling his cardiologist and was told to come to the emergency department.  Cardiologist is Dr. Jens Som. States he has a follow-up appointment with him for regularly scheduled appointment on 1/13.  Past Medical History  Diagnosis Date  . CAD (coronary artery disease)     a. s/p CABG 1994 with LIMA to D2 and Matthews, SVG to D1 and ramus intermedius. b. acute inferior MI 2001 s/p rescue PTCA/stent to [facility].  . ED (erectile dysfunction)   . Hypertension   . Nephrolithiasis   . Ischemic cardiomyopathy     a. EF 48% by nuc in 2012.  . Obesity   . CKD (chronic kidney disease), stage II    Past Surgical History  Procedure Laterality Date  . Coronary artery bypass graft  1994  . Surgery for nephrolithiasis     Family History  Problem Relation Age of Onset  . Coronary artery disease Mother     Died at age 64 of MI  . Coronary artery disease  Father     MI age 53   Social History  Substance Use Topics  . Smoking status: Never Smoker   . Smokeless tobacco: None  . Alcohol Use: No    Review of Systems A 10 point review of systems was completed and was negative except for pertinent positives and negatives as mentioned in the history of present illness     Allergies  Ciprofloxacin hcl and Peanut-containing drug products  Home Medications   Prior to Admission medications   Medication Sig Start Date End Date Taking? Authorizing Provider  lisinopril (PRINIVIL,ZESTRIL) 10 MG tablet Take 10 mg by mouth daily.   Yes Historical Provider, MD  pravastatin (PRAVACHOL) 40 MG tablet Take 40 mg by mouth daily.   Yes Historical Provider, MD   BP 107/67 mmHg  Pulse 70  Temp(Src) 98.1 F (36.7 C) (Oral)  Resp 22  SpO2 98% Physical Exam  Constitutional: He is oriented to person, place, and time. He appears well-developed and well-nourished.  Overall well-appearing Caucasian male  HENT:  Head: Normocephalic and atraumatic.  Mouth/Throat: Oropharynx is clear and moist.  Eyes: Conjunctivae are normal. Pupils are equal, round, and reactive to light. Right eye exhibits no discharge. Left eye exhibits no discharge. No scleral icterus.  Neck: Neck supple.  Cardiovascular: Normal rate, regular rhythm and normal heart sounds.   Pulmonary/Chest: Effort normal and breath sounds normal. No respiratory distress. He has no wheezes. He has no  rales.  Abdominal: Soft. There is no tenderness.  Musculoskeletal: He exhibits no tenderness.  Neurological: He is alert and oriented to person, place, and time.  Cranial Nerves II-XII grossly intact  Skin: Skin is warm and dry. No rash noted.  Psychiatric: He has a normal mood and affect.  Nursing note and vitals reviewed.   ED Course  Procedures (including critical care time) Labs Review Labs Reviewed  BASIC METABOLIC PANEL - Abnormal; Notable for the following:    Glucose, Bld 112 (*)     Creatinine, Ser 1.29 (*)    GFR calc non Af Amer 56 (*)    All other components within normal limits  BRAIN NATRIURETIC PEPTIDE - Abnormal; Notable for the following:    B Natriuretic Peptide 172.9 (*)    All other components within normal limits  CBC  I-STAT TROPOININ, ED    Imaging Review Dg Chest 2 View  02/18/2015  CLINICAL DATA:  Chest pain extending down both arms after shoveling snow. Chest pressure. EXAM: CHEST  2 VIEW COMPARISON:  05/07/2011 FINDINGS: Upper normal heart size without edema. Prior CABG.  Subtle levoconvex lumbar scoliosis. The lungs appear clear.  No pleural effusion. IMPRESSION: 1. No active cardiopulmonary disease is radiographically apparent. Prior CABG. Electronically Signed   By: Gaylyn RongWalter  Liebkemann M.D.   On: 02/18/2015 14:07   I have personally reviewed and evaluated these images and lab results as part of my medical decision-making.   EKG Interpretation   Date/Time:  Monday February 18 2015 13:13:32 EST Ventricular Rate:  82 PR Interval:  160 QRS Duration: 112 QT Interval:  378 QTC Calculation: 441 R Axis:   58 Text Interpretation:  Normal sinus rhythm Incomplete right bundle branch  block Inferior infarct , age undetermined Anterolateral infarct , age  undetermined Abnormal ECG nsst elevation v1-v3 Confirmed by RAY MD,  DANIELLE [phone]([id number]) on 02/18/2015 1:20:43 PM     Meds given in ED:  Medications  aspirin chewable tablet 324 mg (324 mg Oral Given 02/18/15 1350)  nitroGLYCERIN (NITROGLYN) 2 % ointment 1 inch (1 inch Topical Given 02/18/15 1350)    New Prescriptions   No medications on file   Filed Vitals:   02/18/15 1331 02/18/15 1345 02/18/15 1453 02/18/15 1515  BP: 124/79 117/76 108/78 107/67  Pulse: 71 72 66 70  Temp:      TempSrc:      Resp: 24 19 14 22   SpO2: 99% 98% 99% 98%    MDM  William Matthews is a 65 y.o. male with history of known coronary artery disease, remote CABG with multiple stents placed comes in for evaluation of chest  pain. Symptoms concerning for unstable angina. Patient given 324 mg of aspirin as well as 1 inch of Nitropaste in the ED. Reports symptoms improved and he is now pain-free in the emergency department. EKG is consistent with previous EKGs with no new ischemic changes. Initial I-STAT troponin is 0.08. Labs are otherwise unremarkable. Chest x-ray shows no acute cardio pulmonary pathology. Consult cardiology, plan for cardiac cath in the morning. Patient admitted to cardiology service.  Discussed with my attending, Dr. Hyacinth MeekerMiller, who also saw and evaluated the patient and agrees with above plan. Final diagnoses:  Unstable angina [facility](HCC)        Joycie PeekBenjamin Bonner Larue, PA-C 02/18/15 1533  Eber HongBrian Miller, MD 02/19/15 1200

## 2015-02-18 NOTE — H&P (Signed)
Cardiology Consultation Note    Patient ID: William Matthews MRN: [id number], DOB: 03/11/1950 Date of Encounter: 02/18/2015, 2:18 PM Primary Physician: No PCP Per Patient Primary Cardiologist: Dr. Jens Somrenshaw  Chief Complaint: chest pain Reason for Admission: chest pain Requesting MD: Dr. Hyacinth MeekerMiller   HPI: Mr. William Matthews is a 65 y/o M with history of CAD (s/p CABG 1994 with LIMA to D2 and LAD, SVG to D1 and ramus intermedius, acute inferior MI 2001 [facility] ICM, essential HTN, nephrolithiasis, obesity, mild CKD II-III (prior Cr 1.3-1.4) who [facility] with chest pain. Last nuclear stress test 06/2010 was abnormal with large, severe defect involving the distal anterior, apical and inferior wall consistent with prior infarct; mild peri-infarct ischemia towards the apex, EF 48%. The recommendation was to consider cardiac cath if he developed recurrent exertional angina. He has not been seen since 2012.  He has been having exertional chest pain for the last year. Whenever he exerts himself he would typically feel chest pressure as well as discomfort in both arms. This would resolve with rest. On Saturday he decided to shovel show and had severe chest discomfort associated with nausea and bilateral arm pain. He came in and rested and pain subsided within 30 minutes. He went back outside to try again and developed recurrent symptoms so he came back inside and took it easy the rest of the day. He did not seek medical attention. Today while walking on a hill to tend to his chickens he developed recurrent pain. He called the office and was advised to come to the ER. Here, he is pain free at rest. He has not had any chest pain at rest but does report prior arm pain at rest. He endorses an episode of near syncope last week when he went to stand up - felt lightheaded. No bleeding, palpitations, LEE reported. +20-30lb intentional weight loss over the last year. In the ED, labs  reveal Cr 1.29 (c/w prior 1.3 in 2012), glucose 112, normal Hgb, normal CBC. He received 324mg  ASA and 1 inch NTG. CXR showed no active CP disease; prior CABG. VSS.   Past Medical History  Diagnosis Date  . CAD (coronary artery disease)     a. s/p CABG 1994 with LIMA to D2 and LAD, SVG to D1 and ramus intermedius. b. acute inferior MI 2001 [facility]  . ED (erectile dysfunction)   . Hypertension   . Nephrolithiasis   . Ischemic cardiomyopathy     a. EF 48% by nuc in 2012.  . Obesity   . CKD (chronic kidney disease), stage II      Surgical History:  Past Surgical History  Procedure Laterality Date  . Coronary artery bypass graft  1994  . Surgery for nephrolithiasis       Home Meds: Prior to Admission medications   Medication Sig Start Date End Date Taking? Authorizing Provider  lisinopril (PRINIVIL,ZESTRIL) 10 MG tablet Take 10 mg by mouth daily.   Yes Historical Provider, MD  pravastatin (PRAVACHOL) 40 MG tablet Take 40 mg by mouth daily.   Yes Historical Provider, MD  amLODipine (NORVASC) 10 MG tablet Take 1 tablet (10 mg total) by mouth daily. 08/11/10 08/11/11  Lewayne BuntingBrian S Crenshaw, MD  pravastatin (PRAVACHOL) 40 MG tablet Take 1 tablet (40 mg total) by mouth every evening. 08/11/10 08/11/11  Lewayne BuntingBrian S Crenshaw, MD  ranitidine (ZANTAC) 150 MG tablet Take 1 tablet (150 mg total) by  mouth 2 (two) times daily. 05/07/11 05/06/12  Linna Hoff, MD    Allergies:  Allergies  Allergen Reactions  . Ciprofloxacin Hcl     Unknown   . Peanut-Containing Drug Products     Unknown     Social History   Social History  . Marital Status: Divorced    Spouse Name: N/A  . Number of Children: 3  . Years of Education: N/A   Occupational History  . Not on file.   Social History Main Topics  . Smoking status: Never Smoker   . Smokeless tobacco: Not on file  . Alcohol Use: No  . Drug Use: No  . Sexual Activity: Yes   Other Topics Concern  . Not on file   Social History  Narrative   Twenty years ago CAD and CABG,no chest pain since per patient but was eval., In ED for chest pain 1 month prev.-MI  r/o but no stress done. Took meds x1 month after MI but caused ED so stopped them had testosterone check >199,wants cialis or testosterone replacement. "if i can't have sex,life isn't worth living".     Family History  Problem Relation Age of Onset  . Coronary artery disease Mother     Died at age 11 of MI  . Coronary artery disease Father     MI age 68    Review of Systems: All other systems reviewed and are otherwise negative except as noted above.  Labs:   Lab Results  Component Value Date   WBC 9.6 02/18/2015   HGB 14.8 02/18/2015   HCT 43.4 02/18/2015   MCV 93.3 02/18/2015   PLT 171 02/18/2015    Recent Labs Lab 02/18/15 1319  NA 139  K 4.8  CL 103  CO2 26  BUN 15  CREATININE 1.29*  CALCIUM 9.6  GLUCOSE 112*    Lab Results  Component Value Date   CHOL 136 10/09/2010   HDL 38.80* 10/09/2010   LDLCALC 77 10/09/2010   TRIG 103.0 10/09/2010    Radiology/Studies:  Dg Chest 2 View  02/18/2015  CLINICAL DATA:  Chest pain extending down both arms after shoveling snow. Chest pressure. EXAM: CHEST  2 VIEW COMPARISON:  05/07/2011 FINDINGS: Upper normal heart size without edema. Prior CABG.  Subtle levoconvex lumbar scoliosis. The lungs appear clear.  No pleural effusion. IMPRESSION: 1. No active cardiopulmonary disease is radiographically apparent. Prior CABG. Electronically Signed   By: Gaylyn Rong M.D.   On: 02/18/2015 14:07   Wt Readings from Last 3 Encounters:  10/09/10 245 lb (111.131 kg)  08/11/10 251 lb (113.853 kg)  07/10/10 252 lb (114.306 kg)    EKG: NSR 82bpm, incomplete RBBB, prior inferior infarct, prior anterolateral infarct, no sig change from prior  Physical Exam: Blood pressure 117/76, pulse 72, temperature 98.1 F (36.7 C), temperature source Oral, resp. rate 19, SpO2 98 %. There is no weight on file to calculate  BMI. General: Well developed, well nourished, in no acute distress. Head: Normocephalic, atraumatic, sclera non-icteric, no xanthomas, nares are without discharge.  Neck: Negative for carotid bruits. JVD not elevated. Lungs: Very faint rhonchi bilaterally to auscultation without wheezes, rales. Breathing is unlabored. Heart: RRR with S1 S2. No murmurs, rubs, or gallops appreciated. Abdomen: Soft, non-tender, non-distended with normoactive bowel sounds. No hepatomegaly. No rebound/guarding. No obvious abdominal masses. Msk:  Strength and tone appear normal for age. Extremities: No clubbing or cyanosis. No edema.  Distal pedal pulses are 2+ and equal bilaterally. Neuro:  Alert and oriented X 3. No focal deficit. No facial asymmetry. Moves all extremities spontaneously. Psych:  Responds to questions appropriately with a normal affect.    Assessment and Plan   1. Chest pain concerning for [place] with prior history of CAD s/p CABG, PCI as above - add scheduled aspirin. Start heparin per pharmacy. Titrate statin. Check lipids. Continue NTG paste. Hold off on beta blocker given borderline low BP and recent symptoms of orthostasis. Not clear why he isn't on one - ?history of ED. Will hold ACEI in prep for cath given mild renal dysfunction and hydrate. Risks and benefits of cardiac catheterization have been discussed with the patient. These include bleeding, infection, kidney damage, stroke, heart attack, death. The patient understands these risks and is willing to proceed. Will schedule for AM. Patient asked to notify nursing if he has recurrent angina.  2. Essential HTN with history of what sounds like possible orthostasis - holding ACEI in prep for cath. Follow.  3. CKD stage II-III - follow. Will hold ACEI in prep for cath.   4. Possible ischemic cardiomyopathy EF 48% by nuc 2012 - will update echocardiogram. Appears euvolemic.  Signed, Laurann Montana PA-C 02/18/2015, 2:18 PM Pager: [phone]  Patient  seen and examined and history reviewed. Agree with above findings and plan. 65 yo WM with known CAD. S/p CABG in 1994 with LIMA to LAD and SVG to diagonal. S/p inferior MI and stent of RCA in 2001. Presents now with classic symptoms of progressive angina. Initial Ecg is unchanged and troponin is normal. Will admit. IV heparin. Topical nitrates. Plan cardiac cath tomorrow via left radial approach. The procedure and risks were reviewed including but not limited to death, myocardial infarction, stroke, arrythmias, bleeding, transfusion, emergency surgery, dye allergy, or renal dysfunction. The patient voices understanding and is agreeable to proceed.Marland Kitchen   Moxon Messler [place], MDFACC 02/18/2015 3:15 PM

## 2015-02-18 NOTE — Telephone Encounter (Signed)
Pt of Dr Jens Somrenshaw  Pt called because he was having chest pain.  Earlier symptoms concerned his PCP who set up a MV at our office later this week.  However, over the weekend, the pt had severe chest pain while shoveling snow.  Today, he had chest pain walking to the mailbox. Pt still having pain, 5/10. He has not taken any rx.  Requested he take ASA 81 mg x 4, SL NTG if he has it and call 911.   Pt stated he would come to ER, but felt he could travel by car.   Theodore DemarkBarrett, Keyia Moretto, Cordelia Poche-C 02/18/2015 11:33 AM Beeper [phone]

## 2015-02-18 NOTE — ED Notes (Signed)
PT has been told throughout time in the ED that he cannot eat or drink until he is seen by admitting physician as he may require testing later that requires him to be NPO. This nurse enters room and PT is eating food from cookout. PT states, "They told me I could eat! They aren't doing anything until tomorrow."

## 2015-02-18 NOTE — ED Notes (Signed)
Pt here for chest pain that started Saturday while he was shoveling snow. sts was severe and has gotten better but still pressure in chest and SOB more with exertion.

## 2015-02-18 NOTE — ED Notes (Signed)
[facility] tech

## 2015-02-18 NOTE — ED Notes (Signed)
PT taken to x-ray at this time

## 2015-02-19 ENCOUNTER — Encounter (HOSPITAL_COMMUNITY): Payer: Self-pay | Admitting: Cardiovascular Disease

## 2015-02-19 ENCOUNTER — Inpatient Hospital Stay (HOSPITAL_COMMUNITY): Payer: No Typology Code available for payment source

## 2015-02-19 ENCOUNTER — Encounter (HOSPITAL_COMMUNITY)
Admission: EM | Disposition: A | Discharge status: Home / Self Care | Payer: Self-pay | Source: Home / Self Care | Attending: Cardiology

## 2015-02-19 DIAGNOSIS — I25110 Atherosclerotic heart disease of native coronary artery with unstable angina pectoris: Secondary | ICD-10-CM

## 2015-02-19 DIAGNOSIS — R079 Chest pain, unspecified: Secondary | ICD-10-CM

## 2015-02-19 HISTORY — PX: CARDIAC CATHETERIZATION: SHX172

## 2015-02-19 LAB — HEMOGLOBIN A1C
HEMOGLOBIN A1C: 5.6 % (ref 4.8–5.6)
Mean Plasma Glucose: 114 mg/dL

## 2015-02-19 LAB — HEPARIN LEVEL (UNFRACTIONATED): HEPARIN UNFRACTIONATED: 0.26 [IU]/mL — AB (ref 0.30–0.70)

## 2015-02-19 LAB — CBC
HEMATOCRIT: 41.6 % (ref 39.0–52.0)
Hemoglobin: 13.7 g/dL (ref 13.0–17.0)
MCH: 31.2 pg (ref 26.0–34.0)
MCHC: 32.9 g/dL (ref 30.0–36.0)
MCV: 94.8 fL (ref 78.0–100.0)
PLATELETS: 160 10*3/uL (ref 150–400)
RBC: 4.39 MIL/uL (ref 4.22–5.81)
RDW: 12.6 % (ref 11.5–15.5)
WBC: 11.5 10*3/uL — AB (ref 4.0–10.5)

## 2015-02-19 LAB — BASIC METABOLIC PANEL
ANION GAP: 10 (ref 5–15)
BUN: 14 mg/dL (ref 6–20)
CHLORIDE: 105 mmol/L (ref 101–111)
CO2: 24 mmol/L (ref 22–32)
CREATININE: 1.31 mg/dL — AB (ref 0.61–1.24)
Calcium: 8.5 mg/dL — ABNORMAL LOW (ref 8.9–10.3)
GFR calc Af Amer: >60 mL/min (ref >60)
GFR calc non Af Amer: 55 mL/min — ABNORMAL LOW (ref >60)
Glucose, Bld: 116 mg/dL — ABNORMAL HIGH (ref 65–99)
POTASSIUM: 3.9 mmol/L (ref 3.5–5.1)
Sodium: 139 mmol/L (ref 135–145)

## 2015-02-19 LAB — LIPID PANEL
CHOL/HDL RATIO: 2.9 ratio
Cholesterol: 102 mg/dL (ref 0–200)
HDL: 35 mg/dL — AB (ref >40)
LDL CALC: 29 mg/dL (ref 0–99)
TRIGLYCERIDES: 192 mg/dL — AB (ref <150)
VLDL: 38 mg/dL (ref 0–40)

## 2015-02-19 LAB — POCT ACTIVATED CLOTTING TIME: ACTIVATED CLOTTING TIME: 410 s

## 2015-02-19 LAB — TROPONIN I: TROPONIN I: 0.09 ng/mL — AB (ref <0.031)

## 2015-02-19 SURGERY — LEFT HEART CATH AND CORS/GRAFTS ANGIOGRAPHY

## 2015-02-19 MED ORDER — FENTANYL CITRATE (PF) 100 MCG/2ML IJ SOLN
INTRAMUSCULAR | Status: AC
  Filled 2015-02-19: qty 2

## 2015-02-19 MED ORDER — NITROGLYCERIN 1 MG/10 ML FOR IR/CATH LAB
INTRA_ARTERIAL | Status: AC
  Filled 2015-02-19: qty 10

## 2015-02-19 MED ORDER — HEPARIN SODIUM (PORCINE) 1000 UNIT/ML IJ SOLN
INTRAMUSCULAR | Status: AC
  Filled 2015-02-19: qty 1

## 2015-02-19 MED ORDER — SODIUM CHLORIDE 0.9 % IV SOLN: 250.0000 mL | INTRAVENOUS | Status: DC | PRN

## 2015-02-19 MED ORDER — ACETAMINOPHEN 325 MG PO TABS: 650.0000 mg | ORAL_TABLET | ORAL | Status: DC | PRN

## 2015-02-19 MED ORDER — MIDAZOLAM HCL 2 MG/2ML IJ SOLN
INTRAMUSCULAR | Status: DC | PRN
  Administered 2015-02-19 (×2): 1 mg via INTRAVENOUS

## 2015-02-19 MED ORDER — IOHEXOL 350 MG/ML SOLN
INTRAVENOUS | Status: DC | PRN
  Administered 2015-02-19: 135 mL via INTRA_ARTERIAL

## 2015-02-19 MED ORDER — HEPARIN (PORCINE) IN NACL 2-0.9 UNIT/ML-% IJ SOLN
INTRAMUSCULAR | Status: AC
  Filled 2015-02-19: qty 1000

## 2015-02-19 MED ORDER — TICAGRELOR 90 MG PO TABS
ORAL_TABLET | ORAL | Status: DC | PRN
Start: 2015-02-19 — End: 2015-02-19
  Administered 2015-02-19: 180 mg via ORAL

## 2015-02-19 MED ORDER — VERAPAMIL HCL 2.5 MG/ML IV SOLN
INTRAVENOUS | Status: AC
  Filled 2015-02-19: qty 2

## 2015-02-19 MED ORDER — LIDOCAINE HCL (PF) 1 % IJ SOLN
INTRAMUSCULAR | Status: AC
  Filled 2015-02-19: qty 30

## 2015-02-19 MED ORDER — MIDAZOLAM HCL 2 MG/2ML IJ SOLN
INTRAMUSCULAR | Status: AC
  Filled 2015-02-19: qty 2

## 2015-02-19 MED ORDER — SODIUM CHLORIDE 0.9 % IV SOLN: INTRAVENOUS | Status: AC

## 2015-02-19 MED ORDER — TICAGRELOR 90 MG PO TABS
90.0000 mg | ORAL_TABLET | Freq: Two times a day (BID) | ORAL | Status: DC
Start: 2015-02-19 — End: 2015-02-20
  Administered 2015-02-19 – 2015-02-20 (×2): 90 mg via ORAL
  Filled 2015-02-19 (×2): qty 1

## 2015-02-19 MED ORDER — FENTANYL CITRATE (PF) 100 MCG/2ML IJ SOLN
INTRAMUSCULAR | Status: DC | PRN
  Administered 2015-02-19: 50 ug via INTRAVENOUS

## 2015-02-19 MED ORDER — HEPARIN (PORCINE) IN NACL 2-0.9 UNIT/ML-% IJ SOLN
INTRAMUSCULAR | Status: DC | PRN
  Administered 2015-02-19: 08:00:00 via INTRA_ARTERIAL

## 2015-02-19 MED ORDER — SODIUM CHLORIDE 0.9 % IJ SOLN
3.0000 mL | Freq: Two times a day (BID) | INTRAMUSCULAR | Status: DC
  Administered 2015-02-19 (×2): 3 mL via INTRAVENOUS

## 2015-02-19 MED ORDER — METOPROLOL TARTRATE 25 MG PO TABS
25.0000 mg | ORAL_TABLET | Freq: Two times a day (BID) | ORAL | Status: DC
  Administered 2015-02-19 – 2015-02-20 (×3): 25 mg via ORAL
  Filled 2015-02-19 (×3): qty 1

## 2015-02-19 MED ORDER — TICAGRELOR 90 MG PO TABS
ORAL_TABLET | ORAL | Status: AC
  Filled 2015-02-19: qty 2

## 2015-02-19 MED ORDER — SODIUM CHLORIDE 0.9 % IJ SOLN
3.0000 mL | INTRAMUSCULAR | Status: DC | PRN
Start: 2015-02-19 — End: 2015-02-20

## 2015-02-19 MED ORDER — ONDANSETRON HCL 4 MG/2ML IJ SOLN: 4.0000 mg | Freq: Four times a day (QID) | INTRAMUSCULAR | Status: DC | PRN

## 2015-02-19 MED ORDER — NITROGLYCERIN 1 MG/10 ML FOR IR/CATH LAB
INTRA_ARTERIAL | Status: DC | PRN
  Administered 2015-02-19: 09:00:00

## 2015-02-19 MED ORDER — HEPARIN SODIUM (PORCINE) 1000 UNIT/ML IJ SOLN
INTRAMUSCULAR | Status: DC | PRN
  Administered 2015-02-19 (×2): 5000 [IU] via INTRAVENOUS

## 2015-02-19 SURGICAL SUPPLY — 16 items
BALLN EMERGE MR 2.5X12 (BALLOONS) ×4
BALLN ~~LOC~~ EUPHORA RX 3.75X12 (BALLOONS) ×4
BALLOON EMERGE MR 2.5X12 (BALLOONS) ×2 IMPLANT
BALLOON ~~LOC~~ EUPHORA RX 3.75X12 (BALLOONS) ×2 IMPLANT
CATH INFINITI 5FR MULTPACK ANG (CATHETERS) ×4 IMPLANT
CATH VISTA GUIDE 6FR XBLAD3.5 (CATHETERS) ×4 IMPLANT
DEVICE RAD COMP TR BAND LRG (VASCULAR PRODUCTS) ×4 IMPLANT
GLIDESHEATH SLEND SS 6F .021 (SHEATH) ×4 IMPLANT
KIT ENCORE 26 ADVANTAGE (KITS) ×4 IMPLANT
KIT HEART LEFT (KITS) ×4 IMPLANT
PACK CARDIAC CATHETERIZATION (CUSTOM PROCEDURE TRAY) ×4 IMPLANT
STENT PROMUS PREM MR 3.5X16 (Permanent Stent) ×4 IMPLANT
TRANSDUCER W/STOPCOCK (MISCELLANEOUS) ×4 IMPLANT
TUBING CIL FLEX 10 FLL-RA (TUBING) ×4 IMPLANT
WIRE COUGAR XT STRL 190CM (WIRE) ×4 IMPLANT
WIRE SAFE-T 1.5MM-J .035X260CM (WIRE) ×4 IMPLANT

## 2015-02-19 NOTE — Interval H&P Note (Signed)
History and Physical Interval Note:  02/19/2015 7:33 AM  William Matthews  has presented today for cardiac cath with the diagnosis of unstable angina, NSTEMI.  The various methods of treatment have been discussed with the patient and family. After consideration of risks, benefits and other options for treatment, the patient has consented to  Procedure(s): Left Heart Cath and Cors/Grafts Angiography (N/A) as a surgical intervention .  The patient's history has been reviewed, patient examined, no change in status, stable for surgery.  I have reviewed the patient's chart and labs.  Questions were answered to the patient's satisfaction.   Cath Lab Visit (complete for each Cath Lab visit)  Clinical Evaluation Leading to the Procedure:   ACS: Yes.    Non-ACS:    Anginal Classification: CCS IV  Anti-ischemic medical therapy: No Therapy  Non-Invasive Test Results: No non-invasive testing performed  Prior CABG: Previous CABG          [name]

## 2015-02-19 NOTE — Progress Notes (Signed)
  Echocardiogram 2D Echocardiogram has been performed.  William Matthews, William Matthews 02/19/2015, 2:59 PM

## 2015-02-20 ENCOUNTER — Encounter (HOSPITAL_COMMUNITY): Payer: Self-pay | Admitting: Physician Assistant

## 2015-02-20 DIAGNOSIS — N529 Male erectile dysfunction, unspecified: Secondary | ICD-10-CM | POA: Diagnosis present

## 2015-02-20 DIAGNOSIS — I214 Non-ST elevation (NSTEMI) myocardial infarction: Principal | ICD-10-CM

## 2015-02-20 DIAGNOSIS — E669 Obesity, unspecified: Secondary | ICD-10-CM | POA: Diagnosis present

## 2015-02-20 DIAGNOSIS — I2510 Atherosclerotic heart disease of native coronary artery without angina pectoris: Secondary | ICD-10-CM | POA: Diagnosis present

## 2015-02-20 DIAGNOSIS — R7303 Prediabetes: Secondary | ICD-10-CM | POA: Diagnosis present

## 2015-02-20 DIAGNOSIS — I25708 Atherosclerosis of coronary artery bypass graft(s), unspecified, with other forms of angina pectoris: Secondary | ICD-10-CM

## 2015-02-20 DIAGNOSIS — E785 Hyperlipidemia, unspecified: Secondary | ICD-10-CM | POA: Diagnosis present

## 2015-02-20 LAB — BASIC METABOLIC PANEL
ANION GAP: 7 (ref 5–15)
BUN: 8 mg/dL (ref 6–20)
CHLORIDE: 106 mmol/L (ref 101–111)
CO2: 27 mmol/L (ref 22–32)
Calcium: 8.7 mg/dL — ABNORMAL LOW (ref 8.9–10.3)
Creatinine, Ser: 1.23 mg/dL (ref 0.61–1.24)
GFR calc non Af Amer: 59 mL/min — ABNORMAL LOW (ref >60)
Glucose, Bld: 113 mg/dL — ABNORMAL HIGH (ref 65–99)
Potassium: 4.2 mmol/L (ref 3.5–5.1)
SODIUM: 140 mmol/L (ref 135–145)

## 2015-02-20 LAB — CBC
HEMATOCRIT: 39.8 % (ref 39.0–52.0)
HEMOGLOBIN: 13.2 g/dL (ref 13.0–17.0)
MCH: 30.8 pg (ref 26.0–34.0)
MCHC: 33.2 g/dL (ref 30.0–36.0)
MCV: 93 fL (ref 78.0–100.0)
Platelets: 150 10*3/uL (ref 150–400)
RBC: 4.28 MIL/uL (ref 4.22–5.81)
RDW: 12.4 % (ref 11.5–15.5)
WBC: 9.2 10*3/uL (ref 4.0–10.5)

## 2015-02-20 MED ORDER — ASPIRIN 81 MG PO TBEC: 81.0000 mg | DELAYED_RELEASE_TABLET | Freq: Every day | ORAL | Status: DC

## 2015-02-20 MED ORDER — ANGIOPLASTY BOOK
Freq: Once | Status: DC
  Filled 2015-02-20: qty 1

## 2015-02-20 MED ORDER — TICAGRELOR 90 MG PO TABS: 90.0000 mg | ORAL_TABLET | Freq: Two times a day (BID) | ORAL | Status: DC

## 2015-02-20 MED ORDER — NITROGLYCERIN 0.4 MG SL SUBL: 0.4000 mg | SUBLINGUAL_TABLET | SUBLINGUAL | Status: DC | PRN

## 2015-02-20 MED ORDER — METOPROLOL TARTRATE 25 MG PO TABS: 25.0000 mg | ORAL_TABLET | Freq: Two times a day (BID) | ORAL | Status: DC

## 2015-02-20 MED ORDER — ATORVASTATIN CALCIUM 80 MG PO TABS: 80.0000 mg | ORAL_TABLET | Freq: Every day | ORAL | Status: DC

## 2015-02-20 NOTE — Discharge Instructions (Signed)

## 2015-02-20 NOTE — Discharge Summary (Signed)
Discharge Summary   Patient ID: William Matthews MRN: [id number], DOB/AGE: 1950/09/08 65 y.o. Admit date: 02/18/2015 D/C date:     02/20/2015  Primary Cardiologist: Dr. Jens Som  Principal Problem:   NSTEMI (non-ST elevated myocardial infarction) [facility]) Active Problems:   Essential hypertension   CKD (chronic kidney disease), stage II   Hyperlipidemia   Prediabetes   Obesity   ED (erectile dysfunction)   CAD (coronary artery disease)    Admission Dates: NSTEMI s/p DES to ostial LCx Discharge Diagnosis: 02/18/15-02/20/15   HPI: William Matthews is a 65 y.o. male with a history of CAD (s/p CABG 1994 with LIMA to D2 and LAD, SVG to D1 and ramus intermedius, acute inferior MI 2001 s/p rescue PTCA/stent to [facility]), ICM (EF 48% in 2012), HTN, nephrolithiasis, obesity, mild CKD II-III (prior Cr 1.3-1.4) who presented to [facility] on 02/18/15 with chest pain.    Last nuclear stress test 06/2010 was abnormal with large, severe defect involving the distal anterior, apical and inferior wall consistent with prior infarct; mild peri-infarct ischemia towards the apex, EF 48%. The recommendation was to consider cardiac cath if he developed recurrent exertional angina; [place], he was lost to follow up after 2012.  He was going to see Dr. Jens Som in the office on 02/22/15 for exertional CP as a "new patient" since he had not been seen in over 3 years. However, he called the on call provider on 02/18/15 about chest pain while shoveling snow and walking to the mail box and was told to come the ER to be evaluated. His chest pain was concerning for [place]. He was placed on an heparin gtt and nitropaste and he was admitted with plans for [facility] the following day.    Hospital Course  NSTEMI: he eventually ruled in with low level elevated troponin 0.11 --> 0.11--> 0.09.   -- He underwent LHC on 02/19/15 which revealed:   1. Dist Graft lesion, between 2nd Diag and Dist LAD, 50% stenosed.  2. Severe native vessel CAD  s/p 4V CABG with 2/4 patent bypass grafts.   3. Chronically occluded mid RCA beyond the stent. Distal vessel fills from left to right collaterals through the LIMA to LAD.   4. Occluded ostial LAD. Mid and distal vessel fills from the patent LIMA graft. The second diagonal fills from the LIMA as well and supplies the mid LAD. There is a segment of 100% occlusion in the mid LAD proximal to LIMA insertion. The distal LAD fills from the LIMA graft. There is a 50% anastomotic lesion of the LIMA to the LAD.   5. Severe stenosis ostial Circumflex. The vein graft to the OM is occluded. The sequential segment of the vein graft to Diagonal 1 is patent.   6. Successful PTCA/DES x 1 ostium Circumflex.  -- Recommendations: Continue ASA and Brilinta for one year. Started on Metoprolol tart 25mg  BID. Pravastatin discontinued and placed on Atorvastatin 80mg .  -- He is chest pain free and has walked with cardiac rehab.   HTN: Lisinopril 10mg  held on admission for [facility], but resumed at discharge. Metoprolol tart 25mg  BID added to medical regimen.  CKD stage II-III: ACE held prior to cath. His creat has actually improved since admission and home ACEi will be restarted.    Ischemic cardiomyopathy EF 48% by nuc 2012--> now normalized:  2D ECHO on this admission with normal LV size and systolic function, EF 55-60%. Moderate diastolic dysfunction. Normal RV size and systolic function. No  significant valvular abnormalities.   The patient has had an uncomplicated hospital course and is recovering well. The radial catheter site is stable. He has been seen by Dr. [place] today and deemed ready for discharge home. All follow-up appointments have been scheduled.  A written RX for a 30 day free supply of Brilinta was provided for the patient. Discharge medications are listed below.   Discharge Vitals: Blood pressure 152/71, pulse 83, temperature 98.2 F (36.8 C), temperature source Oral, resp. rate 20, height 5\' 10"  (1.778 m),  weight 242 lb 8.1 oz (110 kg), SpO2 97 %.  Physical Exam: General: Well developed, well nourished, in no acute distress. Head: Normocephalic, atraumatic, sclera non-icteric, no xanthomas, nares are without discharge. Neck: Negative for carotid bruits. JVD not elevated. Lungs: Very faint rhonchi bilaterally to auscultation without wheezes, rales. Breathing is unlabored. Heart: RRR with S1 S2. No murmurs, rubs, or gallops appreciated. Abdomen: Soft, non-tender, non-distended with normoactive bowel sounds. No hepatomegaly. No rebound/guarding. No obvious abdominal masses. Msk: Strength and tone appear normal for age. Extremities: No clubbing or cyanosis. No edema. Distal pedal pulses are 2+ and equal bilaterally. Neuro: Alert and oriented X 3. No focal deficit. No facial asymmetry. Moves all extremities spontaneously. Psych: Responds to questions appropriately with a normal affect.   Labs: Lab Results  Component Value Date   WBC 9.2 02/20/2015   HGB 13.2 02/20/2015   HCT 39.8 02/20/2015   MCV 93.0 02/20/2015   PLT 150 02/20/2015    Recent Labs Lab 02/18/15 1724  02/20/15 0640  NA  --   < > 140  K  --   < > 4.2  CL  --   < > 106  CO2  --   < > 27  BUN  --   < > 8  CREATININE  --   < > 1.23  CALCIUM  --   < > 8.7*  PROT 6.8  --   --   BILITOT 1.1  --   --   ALKPHOS 53  --   --   ALT 19  --   --   AST 28  --   --   GLUCOSE  --   < > 113*  < > = values in this interval not displayed.  Recent Labs  02/18/15 1724 02/18/15 2237 02/19/15 0436  TROPONINI 0.11* 0.11* 0.09*   Lab Results  Component Value Date   CHOL 102 02/19/2015   HDL 35* 02/19/2015   LDLCALC 29 02/19/2015   TRIG 192* 02/19/2015    Diagnostic Studies/Procedures   Dg Chest 2 View  02/18/2015  CLINICAL DATA:  Chest pain extending down both arms after shoveling snow. Chest pressure. EXAM: CHEST  2 VIEW COMPARISON:  05/07/2011 FINDINGS: Upper normal heart size without edema. Prior CABG.  Subtle  levoconvex lumbar scoliosis. The lungs appear clear.  No pleural effusion. IMPRESSION: 1. No active cardiopulmonary disease is radiographically apparent. Prior CABG. Electronically Signed   By: Gaylyn Rong M.D.   On: 02/18/2015 14:07    2D ECHO: 02/19/2015 LV EF: 55 - 60% Study Conclusions - Left ventricle: The cavity size was normal. Wall thickness was   normal. Systolic function was normal. The estimated ejection   fraction was in the range of 55% to 60%. Wall motion was normal;   there were no regional wall motion abnormalities. Features are   consistent with a pseudonormal left ventricular filling pattern,   with concomitant abnormal relaxation and increased filling  pressure (grade 2 diastolic dysfunction). - Aortic valve: There was no stenosis. - Mitral valve: There was trivial regurgitation. - Left atrium: The atrium was mildly dilated. - Right ventricle: The cavity size was normal. Systolic function   was normal. - Right atrium: The atrium was mildly dilated. - Pulmonary arteries: PA peak pressure: 29 mm Hg (S). - Inferior vena cava: The vessel was normal in size. The   respirophasic diameter changes were in the normal range (>= 50%),   consistent with normal central venous pressure. Impressions: - Normal LV size and systolic function, EF 55-60%. Moderate   diastolic dysfunction. Normal RV size and systolic function. No   significant valvular abnormalities.   LHC 02/19/15 Procedures    Coronary Stent Intervention   Left Heart Cath and Cors/Grafts Angiography    Conclusion    Dist Graft lesion, between 2nd Diag and Dist LAD, 50% stenosed. 1. Severe native vessel CAD s/p 4V CABG with 2/4 patent bypass grafts.  2. Chronically occluded mid RCA beyond the stent. Distal vessel fills from left to right collaterals through the LIMA to LAD.  3. Occluded ostial LAD. Mid and distal vessel fills from the patent LIMA graft. The second diagonal fills from the LIMA as well  and supplies the mid LAD. There is a segment of 100% occlusion in the mid LAD proximal to LIMA insertion. The distal LAD fills from the LIMA graft. There is a 50% anastomotic lesion of the LIMA to the LAD.  4. Severe stenosis ostial Circumflex. The vein graft to the OM is occluded. The sequential segment of the vein graft to Diagonal 1 is patent.  5. Successful PTCA/DES x 1 ostium Circumflex.  Recommendations: Continue ASA and Brilinta for one year. Continue statin. Start beta blocker. Follow up on echo results.         Discharge Medications     Medication List    STOP taking these medications        pravastatin 40 MG tablet  Commonly known as:  PRAVACHOL      TAKE these medications        aspirin 81 MG EC tablet  Take 1 tablet (81 mg total) by mouth daily.  Notes to Patient:  Prevents clotting in stent and heart attack      *NEW*     atorvastatin 80 MG tablet  Commonly known as:  LIPITOR  Take 1 tablet (80 mg total) by mouth daily.  Notes to Patient:  Cholesterol     *NEW*     STOP PRAVACHOL (pravastatin)     lisinopril 10 MG tablet  Commonly known as:  PRINIVIL,ZESTRIL  Take 10 mg by mouth daily.  Notes to Patient:  Blood pressure     metoprolol tartrate 25 MG tablet  Commonly known as:  LOPRESSOR  Take 1 tablet (25 mg total) by mouth 2 (two) times daily.  Notes to Patient:  Decreases work of the heart, heart rate and blood pressure      *NEW*     nitroGLYCERIN 0.4 MG SL tablet  Commonly known as:  NITROSTAT  Place 1 tablet (0.4 mg total) under the tongue every 5 (five) minutes x 3 doses as needed for chest pain.  Notes to Patient:  Emergency chest pain      *NEW*     ticagrelor 90 MG Tabs tablet  Commonly known as:  BRILINTA  Take 1 tablet (90 mg total) by mouth 2 (two) times daily.  Notes to Patient:  Prevents clotting  in stent and heart attack      *NEW*        Disposition   The patient will be discharged in stable condition to home. Discharge  Instructions    Amb Referral to Cardiac Rehabilitation    Complete by:  As directed   Diagnosis:  PCI          Follow-up Information    Follow up with Olga Millers, MD On 02/28/2015.   Specialty:  Cardiology   Why:  @ 2pm with Theodore Demark, Dr. Ludwig Clarks PA.    Contact information:   [address] [place] [address] [phone]         Duration of Discharge Encounter: Greater than 30 minutes including physician and PA time.  Signed, Janee Morn, KATHRYN R PA-C 02/20/2015, 11:12 AM

## 2015-02-20 NOTE — Progress Notes (Signed)
CARDIAC REHAB PHASE I   PRE:  Rate/Rhythm: 85 SR PVC  BP:  Supine: 152/71  Sitting:   Standing:    SaO2:   MODE:  Ambulation: 1000 ft   POST:  Rate/Rhythm: 101 ST  BP:  Supine: 152/71  Sitting:   Standing:    SaO2:  [id number] Pt walked 1000 ft with steady gait. Tolerated well. No CP. Education completed with pt. Stressed importance of brilinta with stent. Needs to see case manager for card. Pt has been seeing dietitian twice a month and has lost 30 pounds in about 8 months.  Congratulated pt on his success and encouraged him to continue to see her. He is trying to follow heart healthy diet. Discussed CRP 2 and referring to [place]. Gave financial form for pt to fill out for program. Pt was walking 15 minutes a day, wrote out how to get to 30 or even more as tolerated.   Luetta Nuttingharlene Maizie Garno, RN BSN  02/20/2015 8:43 AM

## 2015-02-20 NOTE — Progress Notes (Signed)
CM provided pt with 30 day free card for Brilinta and instructed pt to take card/ prescription to [facility] [facility]. [facility] Per [employer] [facility] [place] medication assistance program pt is not enrolled in program. States pt needs to come to office and enroll for eligibilty, TWTH (9am-11am and 2pm-4pm. Regarding Brilinta, [place] will apply for medication through its manufacturer once prescription received and approval can take up to 6-8 weeks. Information explained to pt per CM. Pt stated he understood and [facility] and will f/u with Dr.Crenshaw if problems develop with obtaining medication, specifically Brilinta. No other needs identified per CM.

## 2015-02-22 ENCOUNTER — Encounter: Payer: No Typology Code available for payment source | Admitting: Cardiology

## 2015-02-26 ENCOUNTER — Ambulatory Visit: Payer: No Typology Code available for payment source | Attending: Internal Medicine | Admitting: Physician Assistant

## 2015-02-26 VITALS — BP 110/78 | HR 68 | Temp 98.1°F | Resp 16 | Ht 70.0 in | Wt 249.0 lb

## 2015-02-26 DIAGNOSIS — I25810 Atherosclerosis of coronary artery bypass graft(s) without angina pectoris: Secondary | ICD-10-CM

## 2015-02-26 DIAGNOSIS — E669 Obesity, unspecified: Secondary | ICD-10-CM | POA: Insufficient documentation

## 2015-02-26 DIAGNOSIS — I429 Cardiomyopathy, unspecified: Secondary | ICD-10-CM | POA: Insufficient documentation

## 2015-02-26 DIAGNOSIS — Z7982 Long term (current) use of aspirin: Secondary | ICD-10-CM | POA: Insufficient documentation

## 2015-02-26 DIAGNOSIS — Z87442 Personal history of urinary calculi: Secondary | ICD-10-CM | POA: Insufficient documentation

## 2015-02-26 DIAGNOSIS — R7303 Prediabetes: Secondary | ICD-10-CM | POA: Insufficient documentation

## 2015-02-26 DIAGNOSIS — E785 Hyperlipidemia, unspecified: Secondary | ICD-10-CM | POA: Insufficient documentation

## 2015-02-26 DIAGNOSIS — I129 Hypertensive chronic kidney disease with stage 1 through stage 4 chronic kidney disease, or unspecified chronic kidney disease: Secondary | ICD-10-CM | POA: Insufficient documentation

## 2015-02-26 DIAGNOSIS — M7989 Other specified soft tissue disorders: Secondary | ICD-10-CM | POA: Insufficient documentation

## 2015-02-26 DIAGNOSIS — N529 Male erectile dysfunction, unspecified: Secondary | ICD-10-CM | POA: Insufficient documentation

## 2015-02-26 DIAGNOSIS — Z881 Allergy status to other antibiotic agents status: Secondary | ICD-10-CM | POA: Insufficient documentation

## 2015-02-26 DIAGNOSIS — I255 Ischemic cardiomyopathy: Secondary | ICD-10-CM | POA: Insufficient documentation

## 2015-02-26 DIAGNOSIS — Z91010 Allergy to peanuts: Secondary | ICD-10-CM | POA: Insufficient documentation

## 2015-02-26 DIAGNOSIS — Z79899 Other long term (current) drug therapy: Secondary | ICD-10-CM | POA: Insufficient documentation

## 2015-02-26 DIAGNOSIS — I252 Old myocardial infarction: Secondary | ICD-10-CM | POA: Insufficient documentation

## 2015-02-26 DIAGNOSIS — Z6835 Body mass index (BMI) 35.0-35.9, adult: Secondary | ICD-10-CM | POA: Insufficient documentation

## 2015-02-26 DIAGNOSIS — I2510 Atherosclerotic heart disease of native coronary artery without angina pectoris: Secondary | ICD-10-CM | POA: Insufficient documentation

## 2015-02-26 DIAGNOSIS — N182 Chronic kidney disease, stage 2 (mild): Secondary | ICD-10-CM | POA: Insufficient documentation

## 2015-02-26 DIAGNOSIS — Z951 Presence of aortocoronary bypass graft: Secondary | ICD-10-CM | POA: Insufficient documentation

## 2015-02-26 MED ORDER — ATORVASTATIN CALCIUM 80 MG PO TABS: 80.0000 mg | ORAL_TABLET | Freq: Every day | ORAL | Status: DC

## 2015-02-26 MED ORDER — ASPIRIN 81 MG PO TBEC: 81.0000 mg | DELAYED_RELEASE_TABLET | Freq: Every day | ORAL | Status: DC

## 2015-02-26 MED FILL — ATORVASTATIN 80 MG TABLET: 80 | 30 days supply | Qty: 30 | Fill #0

## 2015-02-26 NOTE — Progress Notes (Signed)
William Matthews  WUJ:[id number]  NFA:[id number]  DOB - 1950-06-23  Chief Complaint  Patient presents with  . Foot Swelling  . Hospitalization Follow-up       Subjective:   William Matthews is a 65 y.o. male here today for establishment of care. He was hospitalized from January 9th- 11th for chest pain. He has known coronary artery disease and is status post remote 4 vessel coronary artery bypass grafting. He's had heart attacks since pain and percutaneous coronary intervention since then. Sometimes he gets lost a cardiac follow-up depending on his insurance status. He has a history of a mild cardiomyopathy with ejection fraction 48%.  He presented to the hospital with complaints of chest pain while shoveling. He also was having some exertional symptoms. He will be in for non-ST elevation myocardial infarction with a troponin of 0.11. He was treated with heparin and nitroglycerin. He was continued on his home medications. He went for cardiac catheterization which showed 2 of his 4 grafts to be patent. He received a drug-eluting stent to his circumflex artery. He was started on aspirin and Brilinta. He received 30 days free of Brilinta at discharge. He did not get his aspirin field. He did not get his Lipitor field. He states that he didn't have the money.  He's not had any chest pain. His breathing is okay. He denies any swelling in his lower extremities. He denies syncope. He denies lightheadedness. He has an appointment later this week with cardiology.    ROS: GEN: denies fever or chills, denies change in weight HEENT: denies headache, earache, epistaxis, sore throat, or neck pain LUNGS: denies SHOB, dyspnea, PND, orthopnea CV: denies CP or palpitations EXT: denies muscle spasms or swelling; no pain in lower ext, no weakness NEURO: denies numbness or tingling, denies sz, stroke or TIA  ALLERGIES: Allergies  Allergen Reactions  . Ciprofloxacin Hcl     Unknown   . Peanut-Containing Drug  Products     Unknown     PAST MEDICAL HISTORY: Past Medical History  Diagnosis Date  . CAD (coronary artery disease)     a. s/p CABG 1994 with LIMA to D2 and LAD, SVG to D1 and ramus intermedius. b. acute inferior MI 2001 s/p rescue PTCA/stent to [facility].  b. NSTEMI 02/2014: s/p LHC with DES to oLCx  . ED (erectile dysfunction)   . Hypertension   . Ischemic cardiomyopathy     a. EF 48% by nuc in 2012.  . Obesity   . Hyperlipidemia   . Prediabetes   . Nephrolithiasis   . CKD (chronic kidney disease), stage II     PAST SURGICAL HISTORY: Past Surgical History  Procedure Laterality Date  . Cystoscopy w/ stone manipulation    . Cardiac catheterization N/A 02/19/2015    Procedure: Left Heart Cath and Cors/Grafts Angiography;  Surgeon: Kathleene Hazel, MD;  Location: [facility] INVASIVE CV LAB;  Service: Cardiovascular;  Laterality: N/A;  . Cardiac catheterization  02/19/2015    Procedure: Coronary Stent Intervention;  Surgeon: Kathleene Hazel, MD;  Location: [facility] INVASIVE CV LAB;  Service: Cardiovascular;;  . Coronary angioplasty with stent placement  1994 - 2001 X 5    "stents each time"  . Coronary artery bypass graft  1994    MEDICATIONS AT HOME: Prior to Admission medications   Medication Sig Start Date End Date Taking? Authorizing Provider  aspirin 81 MG EC tablet Take 1 tablet (81 mg total) by mouth daily. 02/26/15  Yes Vivianne Master,  PA-C  atorvastatin (LIPITOR) 80 MG tablet Take 1 tablet (80 mg total) by mouth daily. 02/26/15  Yes Yvonne Stopher Netta Cedars, PA-C  lisinopril (PRINIVIL,ZESTRIL) 10 MG tablet Take 10 mg by mouth daily.   Yes Historical Provider, MD  metoprolol tartrate (LOPRESSOR) 25 MG tablet Take 1 tablet (25 mg total) by mouth 2 (two) times daily. 02/20/15  Yes Janetta Hora, PA-C  nitroGLYCERIN (NITROSTAT) 0.4 MG SL tablet Place 1 tablet (0.4 mg total) under the tongue every 5 (five) minutes x 3 doses as needed for chest pain. 02/20/15  Yes Janetta Hora, PA-C    ticagrelor (BRILINTA) 90 MG TABS tablet Take 1 tablet (90 mg total) by mouth 2 (two) times daily. 02/20/15  Yes Janetta Hora, PA-C     Objective:   Filed Vitals:   02/26/15 1511 02/26/15 1520  BP: 109/70 110/78  Pulse: 68   Temp: 98.1 F (36.7 C)   TempSrc: Oral   Resp: 16   Height:  (1.778 m)   Weight: 249 lb (112.946 kg)   SpO2: 95%     Exam General appearance : Awake, alert, not in any distress. Speech Clear. Not toxic looking HEENT: Atraumatic and Normocephalic, pupils equally reactive to light and accomodation Neck: supple, no JVD. No cervical lymphadenopathy.  Chest:Good air entry bilaterally, no added sounds  CVS: S1 S2 regular, no murmurs.  Abdomen: Bowel sounds present, Non tender and not distended with no gaurding, rigidity or rebound. Extremities: B/L Lower Ext shows no edema, both legs are warm to touch Neurology: Awake alert, and oriented X 3, CN II-XII intact, Non focal Skin:No Rash Wounds:N/A  Data Review Lab Results  Component Value Date   HGBA1C 5.6 02/18/2015     Assessment & Plan  1. CAD s/p recent NSTEMI/PCI CX DES 02/19/15  -Should be on DAPT, script for ASA given  -RF modification  -Keep appt with Dr. Jens Som for 02/28/15  -Concerns about Brilinta once his 30 day supply is out, defer to [facility] 2. Mild CM-now nl EF  -Cont BB/ACE 3. HTN  -Cont BB/ACE  -DASH diet  -Exercise encouraged 4. CKD, stage 2-3  Aggressive RF modification See PCP in 1 month. He prefers to see Dr. Dell Ponto in [facility].   Return if symptoms worsen or fail to improve.  The patient was given clear instructions to go to ER or return to medical center if symptoms don't improve, worsen or new problems develop. The patient verbalized understanding. The patient was told to call to get lab results if they haven't heard anything in the next week.   This note has been created with Education officer, environmental. Any transcriptional  errors are unintentional.    Scot Jun, PA-C [facility] [place], [place] [phone]   02/26/2015, 3:37 PM

## 2015-02-26 NOTE — Patient Instructions (Signed)
Please discuss with Dr. Jens Som what to do about the cost of your Brilinta after your 30 day supply runs out.

## 2015-02-26 NOTE — Progress Notes (Signed)
Patient's here for f/up for hospital visit for unstable angina.   Patient states that he feels good today, denies any pain today.

## 2015-02-28 ENCOUNTER — Encounter: Payer: Self-pay | Admitting: Physician Assistant

## 2015-02-28 ENCOUNTER — Ambulatory Visit (INDEPENDENT_AMBULATORY_CARE_PROVIDER_SITE_OTHER): Payer: Self-pay | Admitting: Physician Assistant

## 2015-02-28 VITALS — BP 112/70 | HR 60 | Ht 70.0 in | Wt 243.0 lb

## 2015-02-28 DIAGNOSIS — I158 Other secondary hypertension: Secondary | ICD-10-CM

## 2015-02-28 DIAGNOSIS — I214 Non-ST elevation (NSTEMI) myocardial infarction: Secondary | ICD-10-CM

## 2015-02-28 NOTE — Progress Notes (Signed)
Cardiology Office Note   Date:  02/28/2015   ID:  William Matthews, DOB 08/28/1950, MRN [id number]  PCP:  No PCP Per Patient  Cardiologist:  Dr Rosemarie Beath, PA-C   Chief Complaint  Patient presents with  . Follow-up    post CATH with Intervention//pt states no Sx.    History of Present Illness: William Matthews is a 65 y.o. male with a history of CAD (s/p CABG 1994 with LIMA to D2 and LAD, SVG to D1 and ramus intermedius, acute inferior MI 2001 s/p rescue PTCA/stent to [facility]), ICM (EF 48% in 2012), HTN, nephrolithiasis, obesity, mild CKD II-III (prior Cr 1.3-1.4)   Discharge 02/20/2015 after non-STEMI where he had DES to the ostial circumflex, chronically occluded LAD and RCA, patent LIMA-LAD with 50% stenosis, SVG-OM occluded, distal limb to D1 okay, EF 55-60 percent by echo  William Matthews presents for post hospital evaluation.  Since discharge from the hospital, he has had no chest pain or shortness of breath. He is tolerating his medications well. He is not having any problems increasing his activity. He is not having any bleeding issues. His cath site is healed in well, with a small remaining.   Past Medical History  Diagnosis Date  . CAD (coronary artery disease)     a. s/p CABG 1994 with LIMA to D2 and LAD, SVG to D1 and ramus intermedius. b. acute inferior MI 2001 s/p rescue PTCA/stent to [facility].  b. NSTEMI 02/2014: s/p LHC with DES to oLCx  . ED (erectile dysfunction)   . Hypertension   . Ischemic cardiomyopathy     a. EF 48% by nuc in 2012.  . Obesity   . Hyperlipidemia   . Prediabetes   . Nephrolithiasis   . CKD (chronic kidney disease), stage II     Past Surgical History  Procedure Laterality Date  . Cystoscopy w/ stone manipulation    . Cardiac catheterization N/A 02/19/2015    Procedure: Left Heart Cath and Cors/Grafts Angiography;  Surgeon: Kathleene Hazel, MD;  Location: [facility] INVASIVE CV LAB;  Service: Cardiovascular;  Laterality: N/A;  .  Cardiac catheterization  02/19/2015    Procedure: Coronary Stent Intervention;  Surgeon: Kathleene Hazel, MD;  Location: [facility] INVASIVE CV LAB;  Service: Cardiovascular;;  . Coronary angioplasty with stent placement  1994 - 2001 X 5    "stents each time"  . Coronary artery bypass graft  1994    Current Outpatient Prescriptions  Medication Sig Dispense Refill  . aspirin 81 MG EC tablet Take 1 tablet (81 mg total) by mouth daily. 90 tablet 3  . atorvastatin (LIPITOR) 80 MG tablet Take 1 tablet (80 mg total) by mouth daily. 30 tablet 6  . lisinopril (PRINIVIL,ZESTRIL) 10 MG tablet Take 10 mg by mouth daily.    . metoprolol tartrate (LOPRESSOR) 25 MG tablet Take 1 tablet (25 mg total) by mouth 2 (two) times daily. 60 tablet 6  . nitroGLYCERIN (NITROSTAT) 0.4 MG SL tablet Place 1 tablet (0.4 mg total) under the tongue every 5 (five) minutes x 3 doses as needed for chest pain. 25 tablet 12  . ticagrelor (BRILINTA) 90 MG TABS tablet Take 1 tablet (90 mg total) by mouth 2 (two) times daily. 60 tablet 11   No current facility-administered medications for this visit.    Allergies:   Ciprofloxacin hcl and Peanut-containing drug products    Social History:  The patient  reports that he has never smoked.  He has never used smokeless tobacco. He reports that he does not drink alcohol or use illicit drugs.   Family History:  The patient's family history includes Coronary artery disease in his father and mother.   ROS:  Please see the history of present illness. All other systems are reviewed and negative.    PHYSICAL EXAM: VS:  BP 112/70 mmHg  Pulse 60  Ht  (1.778 m)  Wt 243 lb (110.224 kg)  BMI 34.87 kg/m2 , BMI Body mass index is 34.87 kg/(m^2). GEN: Well nourished, well developed, male in no acute distress HEENT: normal for age  Neck: no JVD, no carotid bruit, no masses Cardiac: RRR;soft  murmur, no rubs, or gallops Respiratory:  clear to auscultation bilaterally, normal work of  breathing GI: soft, nontender, nondistended, + BS MS: no deformity or atrophy; no edema; distal pulses are 2+ in all 4 extremities; left radial cath site has improving ecchymosis and a small hematoma that is not tender or fluctuant  Skin: warm and dry, no rash Neuro:  Strength and sensation are intact Psych: euthymic mood, full affect   EKG:  EKG is ordered today. The ekg ordered today demonstrates sinus rhythm with an incomplete right bundle branch block, QRS duration 112 ms and pathologic inferior Q waves in 2,3 and aVF.   Recent Labs: 02/18/2015: ALT 19; B Natriuretic Peptide 172.9*; TSH 2.053 02/20/2015: BUN 8; Creatinine, Ser 1.23; Hemoglobin 13.2; Platelets 150; Potassium 4.2; Sodium 140    Lipid Panel    Component Value Date/Time   CHOL 102 02/19/2015 0436   TRIG 192* 02/19/2015 0436   HDL 35* 02/19/2015 0436   CHOLHDL 2.9 02/19/2015 0436   VLDL 38 02/19/2015 0436   LDLCALC 29 02/19/2015 0436     Wt Readings from Last 3 Encounters:  02/28/15 243 lb (110.224 kg)  02/26/15 249 lb (112.946 kg)  02/20/15 242 lb 8.1 oz (110 kg)     Other studies Reviewed: Additional studies/ records that were reviewed today include: Office notes, hospital records and ECGs.  ASSESSMENT AND PLAN:  1.  Non-STEMI, follow-up episode of care: William Matthews is doing well. He is on appropriate medical therapy with aspirin, statin, beta blocker, ACE inhibitor and Brilinta. His blood pressures well controlled.  His ECG today shows pathologic T waves in the inferior leads. Dr. Royann Shivers reviewed previous ECGs with me. His 02/19/2015 ECG at 8:50 AM showed similar changes. However, his 02/20/2015 ECG showed no inferior Q waves at all. The reason for this is unclear, but as he is asymptomatic and the Q waves have been seen before, no further workup is indicated at this time.  Continue current medications and follow-up with Dr. Rennis Golden in 3 months.  2. Hypertension: His blood pressure is well controlled on  current medical therapy, no changes.  Current medicines are reviewed at length with the patient today.  The patient does not have concerns regarding medicines.  The following changes have been made:  no change  Labs/ tests ordered today include:  No orders of the defined types were placed in this encounter.    Disposition:   FU with Dr. Rennis Golden  Signed, Leanna Battles  02/28/2015 3:28 PM    [facility] [address] [place], [place], [place]  [address] Phone: [phone]; Fax: [phone]

## 2015-02-28 NOTE — Patient Instructions (Signed)
Medication Instructions:  Your physician recommends that you continue on your current medications as directed. Please refer to the Current Medication list given to you today.   Labwork: none  Testing/Procedures: none  Follow-Up: Your physician wants you to follow-up in: 3 months with Dr. Jens Som. You will receive a reminder letter in the mail two months in advance. If you don't receive a letter, please call our office to schedule the follow-up appointment.   Any Other Special Instructions Will Be Listed Below (If Applicable).     If you need a refill on your cardiac medications before your next appointment, please call your pharmacy.

## 2015-03-18 ENCOUNTER — Other Ambulatory Visit: Payer: Self-pay | Admitting: Cardiovascular Disease

## 2015-03-28 ENCOUNTER — Other Ambulatory Visit: Payer: Self-pay | Admitting: Cardiovascular Disease

## 2015-03-28 NOTE — Telephone Encounter (Signed)
Rx request sent to pharmacy.  

## 2015-05-09 NOTE — Progress Notes (Signed)
HPI: FU CAD (s/p CABG 1994 with LIMA to D2 and LAD, SVG to D1 and ramus intermedius, acute inferior MI 2001 [facility] ICM (EF 48% in 2012), HTN, nephrolithiasis, obesity, mild CKD II-III (prior Cr 1.3-1.4)  Patient had a non-ST elevation myocardial infarction in January 2017. Cardiac catheterization revealed severe three-vessel coronary disease with occluded LAD, occluded right coronary artery and severe stenosis in the ostial circumflex. The distal right coronary artery filled from collaterals through the LIMA to the LAD. LIMA to the LAD was patent with a 50% lesion at the anastomosis. The saphenous vein graft to the obtuse marginal was occluded. The sequential segment of the vein graft to the diagonal was patent. The patient had PCI of the ostial circumflex. Echocardiogram January 2017 showed normal LV systolic function, grade 2 diastolic dysfunction, mild biatrial enlargement. Since last seen,   Current Outpatient Prescriptions  Medication Sig Dispense Refill  . aspirin 81 MG EC tablet Take 1 tablet (81 mg total) by mouth daily. 90 tablet 3  . atorvastatin (LIPITOR) 80 MG tablet Take 1 tablet (80 mg total) by mouth daily. 30 tablet 6  . BRILINTA 90 MG TABS tablet TAKE 1 TABLET TWICE A DAY 60 tablet 1  . lisinopril (PRINIVIL,ZESTRIL) 10 MG tablet Take 10 mg by mouth daily.    . metoprolol tartrate (LOPRESSOR) 25 MG tablet Take 1 tablet (25 mg total) by mouth 2 (two) times daily. 60 tablet 6  . nitroGLYCERIN (NITROSTAT) 0.4 MG SL tablet Place 1 tablet (0.4 mg total) under the tongue every 5 (five) minutes x 3 doses as needed for chest pain. 25 tablet 12   No current facility-administered medications for this visit.     Past Medical History  Diagnosis Date  . CAD (coronary artery disease)     a. s/p CABG 1994 with LIMA to D2 and LAD, SVG to D1 and ramus intermedius. b. acute inferior MI 2001 [facility]  b. NSTEMI 02/2014: s/p LHC with DES to oLCx    . ED (erectile dysfunction)   . Hypertension   . Ischemic cardiomyopathy     a. EF 48% by nuc in 2012.  . Obesity   . Hyperlipidemia   . Prediabetes   . Nephrolithiasis   . CKD (chronic kidney disease), stage II     Past Surgical History  Procedure Laterality Date  . Cystoscopy w/ stone manipulation    . Cardiac catheterization N/A 02/19/2015    Procedure: Left Heart Cath and Cors/Grafts Angiography;  Surgeon: Kathleene Hazelhristopher D McAlhany, MD;  [facility];  Service: Cardiovascular;  Laterality: N/A;  . Cardiac catheterization  02/19/2015    Procedure: Coronary Stent Intervention;  Surgeon: Kathleene Hazelhristopher D McAlhany, MD;  [facility];  Service: Cardiovascular;;  . Coronary angioplasty with stent placement  1994 - 2001 X 5    "stents each time"  . Coronary artery bypass graft  1994    Social History   Social History  . Marital Status: Divorced    Spouse Name: N/A  . Number of Children: 3  . Years of Education: N/A   Occupational History  . Not on file.   Social History Main Topics  . Smoking status: Never Smoker   . Smokeless tobacco: Never Used  . Alcohol Use: No  . Drug Use: No  . Sexual Activity: Yes   Other Topics Concern  . Not on file   Social History Narrative  Twenty years ago CAD and CABG,no chest pain since per patient but was eval., In ED for chest pain 1 month prev.-MI  r/o but no stress done. Took meds x1 month after MI but caused ED so stopped them had testosterone check >199,wants cialis or testosterone replacement. "if i can't have sex,life isn't worth living".    Family History  Problem Relation Age of Onset  . Coronary artery disease Mother     Died at age 67 of MI  . Coronary artery disease Father     MI age 61    ROS: no fevers or chills, productive cough, hemoptysis, dysphasia, odynophagia, melena, hematochezia, dysuria, hematuria, rash, seizure activity, orthopnea, PND, pedal edema, claudication. Remaining systems  are negative.  Physical Exam: Well-developed well-nourished in no acute distress.  Skin is warm and dry.  HEENT is normal.  Neck is supple.  Chest is clear to auscultation with normal expansion.  Cardiovascular exam is regular rate and rhythm.  Abdominal exam nontender or distended. No masses palpated. Extremities show no edema. neuro grossly intact  ECG     This encounter was created in error - please disregard.

## 2015-05-13 ENCOUNTER — Encounter: Payer: Self-pay | Admitting: Cardiology

## 2015-05-14 ENCOUNTER — Encounter: Payer: Self-pay | Admitting: *Deleted

## 2015-10-03 ENCOUNTER — Encounter (HOSPITAL_COMMUNITY): Payer: Self-pay | Admitting: *Deleted

## 2015-10-03 ENCOUNTER — Inpatient Hospital Stay (HOSPITAL_COMMUNITY)
Admission: EM | Disposition: A | Discharge status: Home / Self Care | Payer: Self-pay | Source: Home / Self Care | Attending: Cardiology

## 2015-10-03 ENCOUNTER — Emergency Department (HOSPITAL_COMMUNITY): Payer: Self-pay

## 2015-10-03 ENCOUNTER — Inpatient Hospital Stay (HOSPITAL_COMMUNITY)
Admission: EM | Admit: 2015-10-03 | Discharge: 2015-10-04 | DRG: 246 | Disposition: A | Discharge status: Home / Self Care | Payer: Self-pay | Attending: Cardiology | Admitting: Cardiology

## 2015-10-03 DIAGNOSIS — Z883 Allergy status to other anti-infective agents status: Secondary | ICD-10-CM

## 2015-10-03 DIAGNOSIS — Z7982 Long term (current) use of aspirin: Secondary | ICD-10-CM

## 2015-10-03 DIAGNOSIS — I255 Ischemic cardiomyopathy: Secondary | ICD-10-CM | POA: Diagnosis present

## 2015-10-03 DIAGNOSIS — I214 Non-ST elevation (NSTEMI) myocardial infarction: Secondary | ICD-10-CM | POA: Diagnosis present

## 2015-10-03 DIAGNOSIS — I252 Old myocardial infarction: Secondary | ICD-10-CM

## 2015-10-03 DIAGNOSIS — Z91010 Allergy to peanuts: Secondary | ICD-10-CM

## 2015-10-03 DIAGNOSIS — I4510 Unspecified right bundle-branch block: Secondary | ICD-10-CM | POA: Diagnosis present

## 2015-10-03 DIAGNOSIS — I200 Unstable angina: Secondary | ICD-10-CM | POA: Insufficient documentation

## 2015-10-03 DIAGNOSIS — Z7902 Long term (current) use of antithrombotics/antiplatelets: Secondary | ICD-10-CM

## 2015-10-03 DIAGNOSIS — E785 Hyperlipidemia, unspecified: Secondary | ICD-10-CM | POA: Diagnosis present

## 2015-10-03 DIAGNOSIS — E669 Obesity, unspecified: Secondary | ICD-10-CM | POA: Diagnosis present

## 2015-10-03 DIAGNOSIS — Z79899 Other long term (current) drug therapy: Secondary | ICD-10-CM

## 2015-10-03 DIAGNOSIS — R7303 Prediabetes: Secondary | ICD-10-CM | POA: Diagnosis present

## 2015-10-03 DIAGNOSIS — I10 Essential (primary) hypertension: Secondary | ICD-10-CM | POA: Diagnosis present

## 2015-10-03 DIAGNOSIS — Z951 Presence of aortocoronary bypass graft: Secondary | ICD-10-CM

## 2015-10-03 DIAGNOSIS — N183 Chronic kidney disease, stage 3 (moderate): Secondary | ICD-10-CM | POA: Diagnosis present

## 2015-10-03 DIAGNOSIS — Z955 Presence of coronary angioplasty implant and graft: Secondary | ICD-10-CM

## 2015-10-03 DIAGNOSIS — I129 Hypertensive chronic kidney disease with stage 1 through stage 4 chronic kidney disease, or unspecified chronic kidney disease: Secondary | ICD-10-CM | POA: Diagnosis present

## 2015-10-03 DIAGNOSIS — Z8249 Family history of ischemic heart disease and other diseases of the circulatory system: Secondary | ICD-10-CM

## 2015-10-03 DIAGNOSIS — Z9119 Patient's noncompliance with other medical treatment and regimen: Secondary | ICD-10-CM

## 2015-10-03 DIAGNOSIS — Y831 Surgical operation with implant of artificial internal device as the cause of abnormal reaction of the patient, or of later complication, without mention of misadventure at the time of the procedure: Secondary | ICD-10-CM | POA: Diagnosis present

## 2015-10-03 DIAGNOSIS — I25110 Atherosclerotic heart disease of native coronary artery with unstable angina pectoris: Secondary | ICD-10-CM | POA: Diagnosis present

## 2015-10-03 DIAGNOSIS — T82855A Stenosis of coronary artery stent, initial encounter: Principal | ICD-10-CM | POA: Diagnosis present

## 2015-10-03 HISTORY — DX: Unspecified chronic bronchitis: J42

## 2015-10-03 HISTORY — DX: Depression, unspecified: F32.A

## 2015-10-03 HISTORY — DX: Major depressive disorder, single episode, unspecified: F32.9

## 2015-10-03 HISTORY — PX: CARDIAC CATHETERIZATION: SHX172

## 2015-10-03 HISTORY — DX: Anxiety disorder, unspecified: F41.9

## 2015-10-03 HISTORY — DX: Gastro-esophageal reflux disease without esophagitis: K21.9

## 2015-10-03 HISTORY — PX: CORONARY ANGIOPLASTY WITH STENT PLACEMENT: SHX49

## 2015-10-03 LAB — BASIC METABOLIC PANEL
ANION GAP: 8 (ref 5–15)
BUN: 13 mg/dL (ref 6–20)
CALCIUM: 9.6 mg/dL (ref 8.9–10.3)
CO2: 24 mmol/L (ref 22–32)
Chloride: 105 mmol/L (ref 101–111)
Creatinine, Ser: 1.31 mg/dL — ABNORMAL HIGH (ref 0.61–1.24)
GFR calc Af Amer: >60 mL/min (ref >60)
GFR, EST NON AFRICAN AMERICAN: 55 mL/min — AB (ref >60)
GLUCOSE: 121 mg/dL — AB (ref 65–99)
Potassium: 3.8 mmol/L (ref 3.5–5.1)
SODIUM: 137 mmol/L (ref 135–145)

## 2015-10-03 LAB — PROTIME-INR
INR: 1.25
PROTHROMBIN TIME: 15.8 s — AB (ref 11.4–15.2)

## 2015-10-03 LAB — I-STAT TROPONIN, ED: TROPONIN I, POC: 0.04 ng/mL (ref 0.00–0.08)

## 2015-10-03 LAB — TROPONIN I
TROPONIN I: 0.43 ng/mL — AB (ref <0.03)
TROPONIN I: 0.36 ng/mL — AB (ref <0.03)

## 2015-10-03 LAB — POCT ACTIVATED CLOTTING TIME
ACTIVATED CLOTTING TIME: 235 s
ACTIVATED CLOTTING TIME: 274 s
ACTIVATED CLOTTING TIME: 356 s

## 2015-10-03 LAB — CBC
HCT: 45 % (ref 39.0–52.0)
Hemoglobin: 15.2 g/dL (ref 13.0–17.0)
MCH: 31.3 pg (ref 26.0–34.0)
MCHC: 33.8 g/dL (ref 30.0–36.0)
MCV: 92.8 fL (ref 78.0–100.0)
Platelets: 185 10*3/uL (ref 150–400)
RBC: 4.85 MIL/uL (ref 4.22–5.81)
RDW: 12.7 % (ref 11.5–15.5)
WBC: 10.1 10*3/uL (ref 4.0–10.5)

## 2015-10-03 SURGERY — LEFT HEART CATH AND CORS/GRAFTS ANGIOGRAPHY
Anesthesia: LOCAL

## 2015-10-03 MED ORDER — NITROGLYCERIN 0.4 MG SL SUBL: 0.4000 mg | SUBLINGUAL_TABLET | SUBLINGUAL | Status: DC | PRN

## 2015-10-03 MED ORDER — VERAPAMIL HCL 2.5 MG/ML IV SOLN
INTRAVENOUS | Status: AC
  Filled 2015-10-03: qty 2

## 2015-10-03 MED ORDER — ALPRAZOLAM 0.25 MG PO TABS: 0.2500 mg | ORAL_TABLET | Freq: Two times a day (BID) | ORAL | Status: DC | PRN

## 2015-10-03 MED ORDER — LIDOCAINE HCL (PF) 1 % IJ SOLN
INTRAMUSCULAR | Status: AC
  Filled 2015-10-03: qty 30

## 2015-10-03 MED ORDER — SODIUM CHLORIDE 0.9% FLUSH: 3.0000 mL | INTRAVENOUS | Status: DC | PRN

## 2015-10-03 MED ORDER — SODIUM CHLORIDE 0.9% FLUSH: 3.0000 mL | Freq: Two times a day (BID) | INTRAVENOUS | Status: DC

## 2015-10-03 MED ORDER — SODIUM CHLORIDE 0.9 % IV SOLN
INTRAVENOUS | Status: DC | PRN
  Administered 2015-10-03: 100 mL/h via INTRAVENOUS

## 2015-10-03 MED ORDER — ASPIRIN 300 MG RE SUPP: 300.0000 mg | RECTAL | Status: AC

## 2015-10-03 MED ORDER — HEPARIN (PORCINE) IN NACL 100-0.45 UNIT/ML-% IJ SOLN
1200.0000 [IU]/h | INTRAMUSCULAR | Status: DC
  Administered 2015-10-03: 1200 [IU]/h via INTRAVENOUS
  Filled 2015-10-03 (×2): qty 250

## 2015-10-03 MED ORDER — METOPROLOL TARTRATE 25 MG PO TABS
25.0000 mg | ORAL_TABLET | Freq: Two times a day (BID) | ORAL | Status: DC
  Administered 2015-10-03 – 2015-10-04 (×3): 25 mg via ORAL
  Filled 2015-10-03 (×3): qty 1

## 2015-10-03 MED ORDER — LISINOPRIL 10 MG PO TABS
10.0000 mg | ORAL_TABLET | Freq: Every day | ORAL | Status: DC
  Administered 2015-10-03 – 2015-10-04 (×2): 10 mg via ORAL
  Filled 2015-10-03 (×2): qty 1

## 2015-10-03 MED ORDER — SODIUM CHLORIDE 0.9 % IV SOLN: 250.0000 mL | INTRAVENOUS | Status: DC | PRN

## 2015-10-03 MED ORDER — ATORVASTATIN CALCIUM 80 MG PO TABS
80.0000 mg | ORAL_TABLET | Freq: Every day | ORAL | Status: DC
  Administered 2015-10-03 – 2015-10-04 (×2): 80 mg via ORAL
  Filled 2015-10-03 (×2): qty 1

## 2015-10-03 MED ORDER — HEPARIN BOLUS VIA INFUSION
4000.0000 [IU] | Freq: Once | INTRAVENOUS | Status: AC
  Administered 2015-10-03: 4000 [IU] via INTRAVENOUS
  Filled 2015-10-03: qty 4000

## 2015-10-03 MED ORDER — IOPAMIDOL (ISOVUE-370) INJECTION 76%
INTRAVENOUS | Status: AC
  Filled 2015-10-03: qty 100

## 2015-10-03 MED ORDER — ACETAMINOPHEN 325 MG PO TABS: 650.0000 mg | ORAL_TABLET | ORAL | Status: DC | PRN

## 2015-10-03 MED ORDER — HEPARIN BOLUS VIA INFUSION: 4000.0000 [IU] | Freq: Once | INTRAVENOUS | Status: DC

## 2015-10-03 MED ORDER — IOPAMIDOL (ISOVUE-370) INJECTION 76%
INTRAVENOUS | Status: DC | PRN
  Administered 2015-10-03: 120 mL via INTRA_ARTERIAL

## 2015-10-03 MED ORDER — ZOLPIDEM TARTRATE 5 MG PO TABS
5.0000 mg | ORAL_TABLET | Freq: Every evening | ORAL | Status: DC | PRN
Start: 2015-10-03 — End: 2015-10-04

## 2015-10-03 MED ORDER — SODIUM CHLORIDE 0.9 % WEIGHT BASED INFUSION: 1.0000 mL/kg/h | INTRAVENOUS | Status: DC

## 2015-10-03 MED ORDER — NITROGLYCERIN 1 MG/10 ML FOR IR/CATH LAB
INTRA_ARTERIAL | Status: DC | PRN
  Administered 2015-10-03: 200 ug via INTRACORONARY

## 2015-10-03 MED ORDER — NITROGLYCERIN 1 MG/10 ML FOR IR/CATH LAB
INTRA_ARTERIAL | Status: AC
  Filled 2015-10-03: qty 10

## 2015-10-03 MED ORDER — IOPAMIDOL (ISOVUE-370) INJECTION 76%
INTRAVENOUS | Status: AC
  Filled 2015-10-03: qty 125

## 2015-10-03 MED ORDER — TICAGRELOR 90 MG PO TABS
90.0000 mg | ORAL_TABLET | Freq: Two times a day (BID) | ORAL | Status: DC
Start: 2015-10-03 — End: 2015-10-04
  Administered 2015-10-04 (×2): 90 mg via ORAL
  Filled 2015-10-03 (×2): qty 1

## 2015-10-03 MED ORDER — ASPIRIN 81 MG PO CHEW: 324.0000 mg | CHEWABLE_TABLET | ORAL | Status: AC

## 2015-10-03 MED ORDER — TICAGRELOR 90 MG PO TABS
ORAL_TABLET | ORAL | Status: DC | PRN
  Administered 2015-10-03: 180 mg via ORAL

## 2015-10-03 MED ORDER — ASPIRIN 81 MG PO CHEW
CHEWABLE_TABLET | ORAL | Status: DC | PRN
  Administered 2015-10-03: 81 mg via ORAL

## 2015-10-03 MED ORDER — HEPARIN (PORCINE) IN NACL 2-0.9 UNIT/ML-% IJ SOLN
INTRAMUSCULAR | Status: AC
  Filled 2015-10-03: qty 1000

## 2015-10-03 MED ORDER — ONDANSETRON HCL 4 MG/2ML IJ SOLN: 4.0000 mg | Freq: Four times a day (QID) | INTRAMUSCULAR | Status: DC | PRN

## 2015-10-03 MED ORDER — HEPARIN SODIUM (PORCINE) 1000 UNIT/ML IJ SOLN
INTRAMUSCULAR | Status: AC
  Filled 2015-10-03: qty 1

## 2015-10-03 MED ORDER — ASPIRIN 81 MG PO CHEW
CHEWABLE_TABLET | ORAL | Status: AC
  Filled 2015-10-03: qty 1

## 2015-10-03 MED ORDER — HEPARIN SODIUM (PORCINE) 1000 UNIT/ML IJ SOLN
INTRAMUSCULAR | Status: DC | PRN
  Administered 2015-10-03 (×2): 3000 [IU] via INTRAVENOUS
  Administered 2015-10-03: 5000 [IU] via INTRAVENOUS

## 2015-10-03 MED ORDER — SODIUM CHLORIDE 0.9 % WEIGHT BASED INFUSION: 3.0000 mL/kg/h | INTRAVENOUS | Status: DC

## 2015-10-03 MED ORDER — TICAGRELOR 90 MG PO TABS
ORAL_TABLET | ORAL | Status: AC
  Filled 2015-10-03: qty 2

## 2015-10-03 MED ORDER — VERAPAMIL HCL 2.5 MG/ML IV SOLN
INTRAVENOUS | Status: DC | PRN
  Administered 2015-10-03: 10 mL via INTRA_ARTERIAL

## 2015-10-03 MED ORDER — SODIUM CHLORIDE 0.9 % WEIGHT BASED INFUSION
1.0000 mL/kg/h | INTRAVENOUS | Status: AC
  Administered 2015-10-03: 1 mL/kg/h via INTRAVENOUS

## 2015-10-03 MED ORDER — HEPARIN (PORCINE) IN NACL 100-0.45 UNIT/ML-% IJ SOLN: 1000.0000 [IU]/h | INTRAMUSCULAR | Status: DC

## 2015-10-03 MED ORDER — ASPIRIN 81 MG PO CHEW
81.0000 mg | CHEWABLE_TABLET | Freq: Every day | ORAL | Status: DC
  Administered 2015-10-04: 11:00:00 81 mg via ORAL
  Filled 2015-10-03: qty 1

## 2015-10-03 MED ORDER — HEPARIN (PORCINE) IN NACL 2-0.9 UNIT/ML-% IJ SOLN
INTRAMUSCULAR | Status: DC | PRN
  Administered 2015-10-03: 1000 mL

## 2015-10-03 SURGICAL SUPPLY — 20 items
BALLN ~~LOC~~ EUPHORA RX 4.0X12 (BALLOONS) ×2
BALLN ~~LOC~~ TREK RX 3.0X12 (BALLOONS) ×2
BALLOON ~~LOC~~ EUPHORA RX 4.0X12 (BALLOONS) ×1 IMPLANT
BALLOON ~~LOC~~ TREK RX 3.0X12 (BALLOONS) ×1 IMPLANT
CATH INFINITI 5 FR IM (CATHETERS) ×2 IMPLANT
CATH INFINITI 5FR MULTPACK ANG (CATHETERS) ×2 IMPLANT
CATH OPTICROSS 40MHZ (CATHETERS) ×2 IMPLANT
CATH VISTA GUIDE 6FR XB3.5 (CATHETERS) ×2 IMPLANT
DEVICE RAD COMP TR BAND LRG (VASCULAR PRODUCTS) ×2 IMPLANT
GLIDESHEATH SLEND SS 6F .021 (SHEATH) ×2 IMPLANT
KIT ENCORE 26 ADVANTAGE (KITS) ×2 IMPLANT
KIT HEART LEFT (KITS) ×2 IMPLANT
PACK CARDIAC CATHETERIZATION (CUSTOM PROCEDURE TRAY) ×2 IMPLANT
SLED PULL BACK IVUS (MISCELLANEOUS) ×2 IMPLANT
STENT RESOLUTE INTEG 4.0X15 (Permanent Stent) ×2 IMPLANT
SYR MEDRAD MARK V 150ML (SYRINGE) ×2 IMPLANT
TRANSDUCER W/STOPCOCK (MISCELLANEOUS) ×2 IMPLANT
TUBING CIL FLEX 10 FLL-RA (TUBING) ×2 IMPLANT
WIRE ASAHI PROWATER 180CM (WIRE) ×2 IMPLANT
WIRE SAFE-T 1.5MM-J .035X260CM (WIRE) ×2 IMPLANT

## 2015-10-03 NOTE — ED Triage Notes (Signed)
Pt c/o mid CP non radiating for a few months, pt reports pain more severe with activity, pt c/o SOB with episodes, pt reports nausea & vomiting last night, pt reports taking Cialis, pt A&O x4, hx of stent placement

## 2015-10-03 NOTE — Progress Notes (Signed)
ANTICOAGULATION CONSULT NOTE - Initial Consult  Pharmacy Consult for heparin Indication: chest pain/ACS  Allergies  Allergen Reactions  . Ciprofloxacin Hcl     Unknown   . Peanut-Containing Drug Products     Unknown     Patient Measurements: Height: 5\' 10"  (177.8 cm) Weight: 230 lb (104.3 kg) IBW/kg (Calculated) : 73 Heparin Dosing Weight: 95 kg  Vital Signs: Temp: 98.3 F (36.8 C) (08/24 0913) Temp Source: Oral (08/24 0757) BP: 133/87 (08/24 0915) Pulse Rate: 79 (08/24 0915)  Labs:  Recent Labs  10/03/15 0810  HGB 15.2  HCT 45.0  PLT 185  CREATININE 1.31*    Estimated Creatinine Clearance: 66.2 mL/min (by C-G formula based on SCr of 1.31 mg/dL).  Assessment: 65 yo m presenting with mid CP x months; more severe with activity - took cialis last evening  PMH: CAD with stents  AC: none pta - hep iv for ro acs  Renal: SCr 1.31  Heme; H&H 15.2/45, Plt 185  Goal of Therapy:  Heparin level 0.3-0.7 units/ml Monitor platelets by anticoagulation protocol: Yes   Plan:  Heparin bolus 4000 units/hr Heparin infusion 1200 units/hr Initial lvl 1600 Daily HL, CBC Monitor s/sx of bleeding F/U Cards  Isaac BlissMichael Breaker Springer, PharmD, BCPS, [facility]CCCP Clinical Pharmacist Pager [phone] 10/03/2015 9:37 AM

## 2015-10-03 NOTE — ED Provider Notes (Signed)
[facility]-EMERGENCY DEPT Provider Note   CSN: [id number] Arrival date & time: 10/03/15  4098     History   Chief Complaint Chief Complaint  Patient presents with  . Chest Pain    HPI William Matthews is a 65 y.o. male.  HPI  65 year old male with history of CAD (s/p CABG 1994 with LIMA to D2 and LAD, SVG to D1 and ramus intermedius, acute inferior MI 2001 s/p rescue PTCA/stent to [facility]), ICM (EF 48% in 2012), HTN, nephrolithiasis, obesity, mild CKD II-III (prior Cr 1.3-1.4) presenting for evaluation of recurrent chest pain. Patient complaining of midsternal chest pain which is nonradiating and has been waxing waning for the past few months usually brought on by activity. He described his pain as a pressure tightness sensation with associated nausea, occasional vomiting and social shortness of breath follows with fatigue. Episodes usually lasting for 15-20 minutes and sometimes longer. This morning he reportedly has similar chest discomfort simply from being upset. He described a pressure sensation throughout his chest with nausea, vomiting, shortness of breath and feeling fatigue without any exertion. Sxs lasting for 30 min and now has dissipated without any treatment.  Denies any associated diaphoresis during this episode. Patient denies fever, productive cough, hemoptysis, abdominal pain, back pain, focal numbness or weakness Patient admits that he does not have a primary care provider and does not follow with any cardiologist recently.  Pt was Discharge 02/20/2015 after non-STEMI where he had DES to the ostial circumflex, chronically occluded LAD and RCA, patent LIMA-LAD with 50% stenosis, SVG-OM occluded, distal limb to D1, EF 55-60 percent by echo. He was placed on Brillinta along with antihypertensive medication but patient has not been taking his medication for at least 2-3 months to lack of funds.      Past Medical History:  Diagnosis Date  . CAD (coronary artery disease)    a. s/p CABG  1994 with LIMA to D2 and LAD, SVG to D1 and ramus intermedius. b. acute inferior MI 2001 s/p rescue PTCA/[facility] to [facility].  b. NSTEMI 02/2014: s/p LHC with DES to oLCx  . CKD (chronic kidney disease), stage II   . ED (erectile dysfunction)   . Hyperlipidemia   . Hypertension   . Ischemic cardiomyopathy    a. EF 48% by nuc in 2012.  . Nephrolithiasis   . Obesity   . Prediabetes     Patient Active Problem List   Diagnosis Date Noted  . NSTEMI (non-ST elevated myocardial infarction) (HCC) 02/20/2015  . Hyperlipidemia   . Prediabetes   . Obesity   . ED (erectile dysfunction)   . CAD (coronary artery disease)   . CKD (chronic kidney disease), stage II 02/18/2015  . Nonspecific abnormal unspecified cardiovascular function study 08/11/2010  . Angina pectoris 07/02/2010  . Essential hypertension 07/02/2010  . Erectile dysfunction 07/02/2010    Past Surgical History:  Procedure Laterality Date  . CARDIAC CATHETERIZATION N/A 02/19/2015   Procedure: Left Heart Cath and Cors/Grafts Angiography;  Surgeon: Kathleene Hazel, MD;  Location: [facility] INVASIVE CV LAB;  Service: Cardiovascular;  Laterality: N/A;  . CARDIAC CATHETERIZATION  02/19/2015   Procedure: Coronary Stent Intervention;  Surgeon: Kathleene Hazel, MD;  Location: [facility] INVASIVE CV LAB;  Service: Cardiovascular;;  . CORONARY ANGIOPLASTY WITH STENT PLACEMENT  1994 - 2001 X 5   "stents each time"  . CORONARY ARTERY BYPASS GRAFT  1994  . CYSTOSCOPY W/ STONE MANIPULATION         Home Medications  Prior to Admission medications   Medication Sig Start Date End Date Taking? Authorizing Provider  aspirin 81 MG EC tablet Take 1 tablet (81 mg total) by mouth daily. 02/26/15   Tiffany Netta CedarsS Noel, PA-C  atorvastatin (LIPITOR) 80 MG tablet Take 1 tablet (80 mg total) by mouth daily. 02/26/15   Vivianne Masteriffany S Noel, PA-C  BRILINTA 90 MG TABS tablet TAKE 1 TABLET TWICE A DAY 03/28/15   Lewayne BuntingBrian S Crenshaw, MD  lisinopril (PRINIVIL,ZESTRIL) 10 MG  tablet Take 10 mg by mouth daily.    Historical Provider, MD  metoprolol tartrate (LOPRESSOR) 25 MG tablet Take 1 tablet (25 mg total) by mouth 2 (two) times daily. 02/20/15   Janetta HoraKathryn R Thompson, PA-C  nitroGLYCERIN (NITROSTAT) 0.4 MG SL tablet Place 1 tablet (0.4 mg total) under the tongue every 5 (five) minutes x 3 doses as needed for chest pain. 02/20/15   Janetta HoraKathryn R Thompson, PA-C    Family History Family History  Problem Relation Age of Onset  . Coronary artery disease Mother     Died at age 67 of MI  . Coronary artery disease Father     MI age 67    Social History Social History  Substance Use Topics  . Smoking status: Never Smoker  . Smokeless tobacco: Never Used  . Alcohol use No     Allergies   Ciprofloxacin hcl and Peanut-containing drug products   Review of Systems Review of Systems  All other systems reviewed and are negative.    Physical Exam Updated Vital Signs BP 134/80 (BP Location: Left Arm)   Pulse 87   Temp 97.9 F (36.6 C) (Oral)   Resp 20   Ht 5\' 10"  (1.778 m)   Wt 104.3 kg   SpO2 99%   BMI 33.00 kg/m   Physical Exam  Constitutional: He appears well-developed and well-nourished. No distress.  HENT:  Head: Atraumatic.  Eyes: Conjunctivae are normal.  Neck: Neck supple. No JVD present.  Cardiovascular: Normal rate and regular rhythm.   Pulmonary/Chest: Effort normal and breath sounds normal.  Abdominal: Soft. There is no tenderness.  Musculoskeletal: He exhibits no edema.  Neurological: He is alert.  Skin: No rash noted.  Psychiatric: He has a normal mood and affect.  Nursing note and vitals reviewed.    ED Treatments / Results  Labs (all labs ordered are listed, but only abnormal results are displayed) Labs Reviewed  BASIC METABOLIC PANEL - Abnormal; Notable for the following:       Result Value   Glucose, Bld 121 (*)    Creatinine, Ser 1.31 (*)    GFR calc non Af Amer 55 (*)    All other components within normal limits  CBC    I-STAT TROPOININ, ED    EKG  EKG Interpretation None     ED ECG REPORT   Date: 10/03/2015  Rate: 91  Rhythm: normal sinus rhythm  QRS Axis: normal  Intervals: normal  ST/T Wave abnormalities: nonspecific ST/T changes  Conduction Disutrbances:right bundle branch block  Narrative Interpretation:   Old EKG Reviewed: unchanged  I have personally reviewed the EKG tracing and agree with the computerized printout as noted.   Radiology Dg Chest 2 View  Result Date: 10/03/2015 CLINICAL DATA:  Chest pain for few days, possible CABG EXAM: CHEST  2 VIEW COMPARISON:  02/18/2015 FINDINGS: Cardiomediastinal silhouette is stable. Status post CABG again noted. No infiltrate or pleural effusion. No pulmonary edema. Mild degenerative changes thoracic spine. IMPRESSION: No active cardiopulmonary  disease.  Status post CABG. Electronically Signed   By: Natasha MeadLiviu  Pop M.D.   On: 10/03/2015 08:51    Procedures Procedures (including critical care time)  Medications Ordered in ED Medications  heparin bolus via infusion 4,000 Units (not administered)  heparin ADULT infusion 100 units/mL (25000 units/23850mL sodium chloride 0.45%) (not administered)     Initial Impression / Assessment and Plan / ED Course  I have reviewed the triage vital signs and the nursing notes.  Pertinent labs & imaging results that were available during my care of the patient were reviewed by me and considered in my medical decision making (see chart for details).  Clinical Course    BP 137/81   Pulse 84   Temp 98.3 F (36.8 C)   Resp 23   Ht 5\' 10"  (1.778 m)   Wt 104.3 kg   SpO2 95%   BMI 33.00 kg/m    Final Clinical Impressions(s) / ED Diagnoses   Final diagnoses:  Unstable angina (HCC)    New Prescriptions New Prescriptions   No medications on file   8:45 AM Patient with significant cardiac history (NSTEMI, stent placement) here with recurrent chest discomfort consistence with unstable angina. He has  been noncompliant with his medication and has not follow up appropriately with his cardiologist does not have a primary care provider. He is currently chest pain-free without any specific treatment. Initial EKG without acute changes.   9:10 AM Care discussed with Dr. Clayborne DanaMesner who have evaluated pt and agrees.  Will start pt on heparin and will consult card for admission.  Pt is aware of plan.   9:20 AM Appreciate consultation from CardMaster Trish who will notify cardiologist to see pt in the ER and likely admission for unstable angina.  Pt will receive Heparin while in the ER.    12:23 PM Cardiologist Dr. Jens Somrenshaw has evaluated pt and will take pt to cath lab today  CRITICAL CARE Performed by: Francesca Strome Total critical care time: 30 minutes Critical care time was exclusive of separately billable procedures and treating other patients. Critical care was necessary to treat or prevent imminent or life-threatening deterioration. Critical care was time spent personally by me on the following activities: development of treatment plan with patient and/or surrogate as well as nursing, discussions with consultants, evaluation of patient's response to treatment, examination of patient, obtaining history from patient or surrogate, ordering and performing treatments and interventions, ordering and review of laboratory studies, ordering and review of radiographic studies, pulse oximetry and re-evaluation of patient's condition.    Fayrene HelperBowie Suheily Birks, PA-C 10/03/15 1223    Marily MemosJason Mesner, MD 10/03/15 [phone]

## 2015-10-03 NOTE — H&P (Signed)
History and Physical   Patient ID: William Matthews MRN: [id number], DOB/AGE: 67/03/1950 67 y.o. Date of Encounter: 10/03/2015  Primary Physician: No PCP Per Patient Primary Cardiologist: Dr Jens Somrenshaw  Chief Complaint:  Chest pain  HPI: William Matthews is a 65 y.o. male with a history of CAD (s/p CABG 1994 with LIMA to D2 and LAD, SVG to D1 and ramus intermedius, acute inferior MI 2001 s/p [facility] ICM (EF 48% in 2012), HTN, nephrolithiasis, obesity, mild CKD II-III (prior Cr 1.3-1.4). NSTEMI 02/2015 w/ 3.5 x 16 mm Promus Premier DES oCFX; 100% LAD and RCA, patent LIMA-D2-LAD with 50% stenosis, SVG-D1-OM 100%, distal limb from D1-OM okay, EF 55-60% by echo.  Seen in office 02/28/2015 and was doing well.  99% Took Brilinta for 30 days but has no insurance and could not afford it, so he stopped.   Began getting chest pain, his usual angina with extra exertion such as shoveling snow, pushing a car, about 2 months ago.  Symptoms progressed to the point that he was getting symptoms with less exertion. Then he got symptoms twice this week with emotional upset.   The chest pain is his usual angina, associated with SOB and diaphoresis. He has had N&V with the last 2 episodes. After the episode last night, he has not felt well and the symptoms concerned him so he came in to the ER. Currently pain-free.   He had stents before his CABG, but says they never stayed open long.    Past Medical History:  Diagnosis Date  . CAD (coronary artery disease)    a. s/p CABG 1994 with LIMA to D2 and LAD, SVG to D1 and ramus intermedius. b. acute inferior MI 2001 s/p [facility].  b. NSTEMI 02/2014: s/p LHC with DES to oLCx  . CKD (chronic kidney disease), stage II   . ED (erectile dysfunction)   . Hyperlipidemia   . Hypertension   . Ischemic cardiomyopathy    a. EF 48% by nuc in 2012.  . Nephrolithiasis   . Obesity   . Prediabetes     Surgical History:  Past  Surgical History:  Procedure Laterality Date  . CARDIAC CATHETERIZATION N/A 02/19/2015   Procedure: Left Heart Cath and Cors/Grafts Angiography;  Surgeon: Kathleene Hazelhristopher D McAlhany, MD;  LAD & RCA 100%, LIMA-LAD 50%, SVG-OM-D1 100% stenosis, distal limb D1-OM ok, CFX 99%  . CARDIAC CATHETERIZATION  02/19/2015   Procedure: Coronary Stent Intervention;  Surgeon: Kathleene Hazelhristopher D McAlhany, MD; 3.5 x 16 mm Promus Premier DES to the ostial CFX   . CORONARY ANGIOPLASTY WITH STENT PLACEMENT  1994 - 2001 X 5   "stents each time"  . CORONARY ARTERY BYPASS GRAFT  1994   LIMA-D2-LAD, SVG-D1-OM2  . CYSTOSCOPY W/ STONE MANIPULATION       I have reviewed the patient's current medications. Prior to Admission medications   Medication Sig Start Date End Date Taking? Authorizing Provider  aspirin 81 MG EC tablet Take 1 tablet (81 mg total) by mouth daily. Patient not taking: Reported on 10/03/2015 02/26/15   Vivianne Masteriffany S Noel, PA-C  atorvastatin (LIPITOR) 80 MG tablet Take 1 tablet (80 mg total) by mouth daily. Patient not taking: Reported on 10/03/2015 02/26/15   Vivianne Masteriffany S Noel, PA-C  BRILINTA 90 MG TABS tablet TAKE 1 TABLET TWICE A DAY Patient not taking: Reported on 10/03/2015 03/28/15   Lewayne BuntingBrian S Marcy Bogosian, MD  lisinopril (PRINIVIL,ZESTRIL) 10 MG tablet Take 10 mg  by mouth daily.    Historical Provider, MD  metoprolol tartrate (LOPRESSOR) 25 MG tablet Take 1 tablet (25 mg total) by mouth 2 (two) times daily. Patient not taking: Reported on 10/03/2015 02/20/15   Janetta HoraKathryn R Thompson, PA-C  nitroGLYCERIN (NITROSTAT) 0.4 MG SL tablet Place 1 tablet (0.4 mg total) under the tongue every 5 (five) minutes x 3 doses as needed for chest pain. Patient not taking: Reported on 10/03/2015 02/20/15   Janetta HoraKathryn R Thompson, PA-C   Scheduled Meds:  Continuous Infusions: . heparin 1,200 Units/hr (10/03/15 0952)   PRN Meds:.  Allergies:  Allergies  Allergen Reactions  . Ciprofloxacin Hcl     Unknown   . Peanut-Containing Drug Products       Unknown     Social History   Social History  . Marital status: Divorced    Spouse name: N/A  . Number of children: 3  . Years of education: N/A   Occupational History  . Retired    Social History Main Topics  . Smoking status: Never Smoker  . Smokeless tobacco: Never Used  . Alcohol use No  . Drug use: No  . Sexual activity: Yes   Other Topics Concern  . Not on file   Social History Narrative   Twenty years ago CAD and CABG,no chest pain since per patient but was eval., In ED for chest pain 1 month prev.-MI  r/o but no stress done. Took meds x1 month after MI but caused ED so stopped them had testosterone check >199,wants cialis or testosterone replacement. "if i can't have sex,life isn't worth living".    Family History  Problem Relation Age of Onset  . Coronary artery disease Mother     Died at age 67 of MI  . Coronary artery disease Father     MI age 67   Family Status  Relation Status  . Mother   . Father     Review of Systems:   Full 14-point review of systems otherwise negative except as noted above.  Physical Exam: Blood pressure 116/79, pulse 64, temperature 98.3 F (36.8 C), resp. rate 18, height 5\' 10"  (1.778 m), weight 230 lb (104.3 kg), SpO2 96 %. General: Well developed, well nourished,male in no acute distress. Head: Normocephalic, atraumatic, sclera non-icteric, no xanthomas, nares are without discharge. Dentition: good  Neck: No carotid bruits. JVD not elevated. No thyromegally Lungs: Good expansion bilaterally. without wheezes or rhonchi.  Heart: Regular rate and rhythm with S1 S2.  No S3 or S4.  No murmur, no rubs, or gallops appreciated. Abdomen: Soft, non-tender, non-distended with normoactive bowel sounds. No hepatomegaly. No rebound/guarding. No obvious abdominal masses. Msk:  Strength and tone appear normal for age. No joint deformities or effusions, no spine or costo-vertebral angle tenderness. Extremities: No clubbing or cyanosis. No  edema.  Distal pedal pulses are 2+ in 4 extrem Neuro: Alert and oriented X 3. Moves all extremities spontaneously. No focal deficits noted. Psych:  Responds to questions appropriately with a normal affect. Skin: No rashes or lesions noted  Labs:   Lab Results  Component Value Date   WBC 10.1 10/03/2015   HGB 15.2 10/03/2015   HCT 45.0 10/03/2015   MCV 92.8 10/03/2015   PLT 185 10/03/2015     Recent Labs Lab 10/03/15 0810  NA 137  K 3.8  CL 105  CO2 24  BUN 13  CREATININE 1.31*  CALCIUM 9.6  GLUCOSE 121*    Recent Labs  10/03/15 0826  TROPIPOC 0.04   Lab Results  Component Value Date   CHOL 102 02/19/2015   HDL 35 (L) 02/19/2015   LDLCALC 29 02/19/2015   TRIG 192 (H) 02/19/2015   Radiology/Studies: Dg Chest 2 View Result Date: 10/03/2015 CLINICAL DATA:  Chest pain for few days, possible CABG EXAM: CHEST  2 VIEW COMPARISON:  02/18/2015 FINDINGS: Cardiomediastinal silhouette is stable. Status post CABG again noted. No infiltrate or pleural effusion. No pulmonary edema. Mild degenerative changes thoracic spine. IMPRESSION: No active cardiopulmonary disease.  Status post CABG. Electronically Signed   By: Natasha Mead M.D.   On: 10/03/2015 08:51   ECG: 08/24 SR, no new acute changes  ASSESSMENT AND PLAN:  Principal Problem:   Unstable angina pectoris (HCC) - admit, cycle ez, needs cath - The risks and benefits of a cardiac catheterization including, but not limited to, death, stroke, MI, kidney damage and bleeding were discussed with the patient who indicates understanding and agrees to proceed.  - will start hydration now, follow BUN/Cr - make sure he understands to let us know if he cannot afford meds.   Melida Quitter, PA-C 10/03/2015 11:53 AM Beeper [phone]   As above, patient seen and examined. Briefly he is a 65 year old male with past medical history of coronary artery disease status post coronary artery bypass graft, ischemic cardiomyopathy,  stage III chronic kidney disease, hypertension, hyperlipidemia with unstable angina. Patient had PCI of the circumflex in January.After 2 months he discontinued his brilinta. Over the past 2 months he has had progressive chest discomfort. It is substernal with radiation to his left upper extremity. Associated dyspnea and nausea. Occurs with exertion relieved with rest. He has had 2 episodes at rest most recently this morning for 30 minutes. Presently pain-free. Initial troponin normal. Electrocardiogram shows sinus rhythm, incomplete RBBB, anterior infarct, inferior infarct, no ST changes. 1 unstable angina-since symptoms are classic. Plan to treat with aspirin, heparin, statin and beta blocker. Proceed with cardiac catheterization. The risks and benefits were discussed and he agrees to proceed. These include myocardial infarctions, CVA and death. 2 hypertension-continue present medications. 3 hyperlipidemia-continue statin. 4 Noncompliance-patient discontinued his brilinta 2 months after his last stent. I discussed the importance of compliance. Olga Millers, MD

## 2015-10-03 NOTE — Progress Notes (Addendum)
Spoke w/ patient regarding compliance. Finances have been a problem getting rx He now qualifies for Medicare, not sure how to apply. Will ask case manager to see and help him with process.  Leanna BattlesBarrett, Milina Pagett, PA-C 10/03/2015 12:40 PM Beeper [phone]

## 2015-10-03 NOTE — ED Provider Notes (Signed)
Medical screening examination/treatment/procedure(s) were conducted as a shared visit with non-physician practitioner(s) and myself.  I personally evaluated the patient during the encounter.  65 yo M w/ significant CAD s/p multiple procedures has been off of his50 anti platelets for a couple months and comes in with progressively worsening stuttering chest pressure similar to previous episodes of NSTEMI. Now at the point that he has the discomfort even with mild anxiety. On my exam, appears well, lungs ctab, heart rrr no m/r/g, no chest ttp.  ECG withotu acute ischemia but with new ST changes. Initial troponin negative.  Concern for UA v NSTEMI. Heparin bolus started/infusion started. Aspirin given. Plan for cardiology consult and admission.   CRITICAL CARE Performed by: Marily MemosMesner, Autumn Pruitt Total critical care time: 35 minutes Critical care time was exclusive of separately billable procedures and treating other patients. Critical care was necessary to treat or prevent imminent or life-threatening deterioration. Critical care was time spent personally by me on the following activities: development of treatment plan with patient and/or surrogate as well as nursing, discussions with consultants, evaluation of patient's response to treatment, examination of patient, obtaining history from patient or surrogate, ordering and performing treatments and interventions, ordering and review of laboratory studies, ordering and review of radiographic studies, pulse oximetry and re-evaluation of patient's condition.    EKG Interpretation  Date/Time:  Thursday October 03 2015 07:53:08 EDT Ventricular Rate:  91 PR Interval:  176 QRS Duration: 116 QT Interval:  380 QTC Calculation: 467 R Axis:   178 Text Interpretation:  Normal sinus rhythm Possible Left atrial enlargement Incomplete right bundle branch block Right ventricular hypertrophy Inferior infarct , age undetermined Anterolateral infarct , age undetermined  Abnormal ECG new nonspecific ST changes since February 20, 2015 Confirmed by [facility] MD, Aloysious Vangieson [phone]([id number]) on 10/03/2015 1:09:12 PM         Marily MemosJason Lolita Faulds, MD 10/03/15 1311

## 2015-10-03 NOTE — Interval H&P Note (Signed)
History and Physical Interval Note:  10/03/2015 2:14 PM  William Matthews  has presented today for surgery, with the diagnosis of unstable angina  The various methods of treatment have been discussed with the patient and family. After consideration of risks, benefits and other options for treatment, the patient has consented to  Procedure(s): Left Heart Cath and Cors/Grafts Angiography (N/A) as a surgical intervention .  The patient's history has been reviewed, patient examined, no change in status, stable for surgery.  I have reviewed the patient's chart and labs.  Questions were answered to the patient's satisfaction.    Cath Lab Visit (complete for each Cath Lab visit)  Clinical Evaluation Leading to the Procedure:   ACS: Yes.    Non-ACS:    Anginal Classification: CCS III  Anti-ischemic medical therapy: Minimal Therapy (1 class of medications)  Non-Invasive Test Results: No non-invasive testing performed  Prior CABG: Previous CABG       Theron Aristaeter [facility] CenterJordanMD,FACC 10/03/2015 2:14 PM

## 2015-10-04 ENCOUNTER — Encounter (HOSPITAL_COMMUNITY): Payer: Self-pay | Admitting: Cardiology

## 2015-10-04 ENCOUNTER — Encounter: Payer: Self-pay | Admitting: *Deleted

## 2015-10-04 ENCOUNTER — Other Ambulatory Visit: Payer: Self-pay | Admitting: *Deleted

## 2015-10-04 ENCOUNTER — Telehealth: Payer: Self-pay | Admitting: Cardiology

## 2015-10-04 DIAGNOSIS — Z006 Encounter for examination for normal comparison and control in clinical research program: Secondary | ICD-10-CM

## 2015-10-04 LAB — LIPID PANEL
CHOLESTEROL: 132 mg/dL (ref 0–200)
HDL: 34 mg/dL — AB (ref >40)
LDL Cholesterol: 82 mg/dL (ref 0–99)
TRIGLYCERIDES: 78 mg/dL (ref <150)
Total CHOL/HDL Ratio: 3.9 RATIO
VLDL: 16 mg/dL (ref 0–40)

## 2015-10-04 LAB — CBC
HEMATOCRIT: 42.7 % (ref 39.0–52.0)
HEMOGLOBIN: 13.9 g/dL (ref 13.0–17.0)
MCH: 31.1 pg (ref 26.0–34.0)
MCHC: 32.6 g/dL (ref 30.0–36.0)
MCV: 95.5 fL (ref 78.0–100.0)
Platelets: 175 10*3/uL (ref 150–400)
RBC: 4.47 MIL/uL (ref 4.22–5.81)
RDW: 13 % (ref 11.5–15.5)
WBC: 10.4 10*3/uL (ref 4.0–10.5)

## 2015-10-04 LAB — COMPREHENSIVE METABOLIC PANEL
ALK PHOS: 54 U/L (ref 38–126)
ALT: 12 U/L — ABNORMAL LOW (ref 17–63)
ANION GAP: 7 (ref 5–15)
AST: 20 U/L (ref 15–41)
Albumin: 3.4 g/dL — ABNORMAL LOW (ref 3.5–5.0)
BILIRUBIN TOTAL: 1.3 mg/dL — AB (ref 0.3–1.2)
BUN: 9 mg/dL (ref 6–20)
CALCIUM: 9 mg/dL (ref 8.9–10.3)
CO2: 26 mmol/L (ref 22–32)
Chloride: 104 mmol/L (ref 101–111)
Creatinine, Ser: 1.23 mg/dL (ref 0.61–1.24)
GFR, EST NON AFRICAN AMERICAN: 59 mL/min — AB (ref >60)
Glucose, Bld: 117 mg/dL — ABNORMAL HIGH (ref 65–99)
Potassium: 3.9 mmol/L (ref 3.5–5.1)
Sodium: 137 mmol/L (ref 135–145)
TOTAL PROTEIN: 6.2 g/dL — AB (ref 6.5–8.1)

## 2015-10-04 LAB — TROPONIN I: Troponin I: 0.49 ng/mL (ref <0.03)

## 2015-10-04 MED ORDER — AMBULATORY NON FORMULARY MEDICATION: 90.0000 mg | Freq: Two times a day (BID) | Status: DC

## 2015-10-04 MED ORDER — HEART ATTACK BOUNCING BOOK
Freq: Once | Status: AC
  Administered 2015-10-04: 03:00:00
  Filled 2015-10-04: qty 1

## 2015-10-04 MED ORDER — ANGIOPLASTY BOOK
Freq: Once | Status: AC
  Administered 2015-10-04: 03:00:00
  Filled 2015-10-04: qty 1

## 2015-10-04 MED ORDER — ATORVASTATIN CALCIUM 80 MG PO TABS: 80.0000 mg | ORAL_TABLET | Freq: Every day | ORAL | 11 refills | Status: DC

## 2015-10-04 MED ORDER — AMBULATORY NON FORMULARY MEDICATION: 81.0000 mg | Freq: Every day | Status: DC

## 2015-10-04 MED ORDER — LIVING BETTER WITH HEART FAILURE BOOK
Freq: Once | Status: AC
  Administered 2015-10-04: 06:00:00

## 2015-10-04 MED ORDER — TICAGRELOR 90 MG PO TABS: 90.0000 mg | ORAL_TABLET | Freq: Two times a day (BID) | ORAL | 0 refills | Status: DC

## 2015-10-04 MED ORDER — NITROGLYCERIN 0.4 MG SL SUBL: 0.4000 mg | SUBLINGUAL_TABLET | SUBLINGUAL | 3 refills | Status: DC | PRN

## 2015-10-04 MED ORDER — ASPIRIN 81 MG PO CHEW: 81.0000 mg | CHEWABLE_TABLET | Freq: Every day | ORAL | 11 refills | Status: DC

## 2015-10-04 MED ORDER — METOPROLOL TARTRATE 25 MG PO TABS: 25.0000 mg | ORAL_TABLET | Freq: Two times a day (BID) | ORAL | 11 refills | Status: DC

## 2015-10-04 MED ORDER — LISINOPRIL 10 MG PO TABS: 10.0000 mg | ORAL_TABLET | Freq: Every day | ORAL | 11 refills | Status: DC

## 2015-10-04 MED FILL — METOPROLOL TARTRATE 25 MG T: 25 | 30 days supply | Qty: 60 | Fill #0

## 2015-10-04 MED FILL — LISINOPRIL 10 MG TABLET: 10 | 30 days supply | Qty: 30 | Fill #0

## 2015-10-04 MED FILL — NITROSTAT 0.4 MG TABLET SL: 0.4 | 25 days supply | Qty: 25 | Fill #0

## 2015-10-04 MED FILL — ATORVASTATIN 80 MG TABLET: 80 | 30 days supply | Qty: 30 | Fill #0

## 2015-10-04 NOTE — Discharge Summary (Signed)
Discharge Summary    Patient ID: William PatchesRoger G Altice,  MRN: [id number], DOB/AGE: 67/03/1950 67 y.o.  Admit date: 10/03/2015 Discharge date: 10/04/2015  Primary Care Provider: No PCP Per Patient Primary Cardiologist: Dr. Jens Somrenshaw  Discharge Diagnoses    Principal Problem:   Unstable angina pectoris [facility](HCC) Active Problems:   NSTEMI (non-ST elevated myocardial infarction) [facility](HCC)   Essential hypertension   Hyperlipidemia   Allergies Allergies  Allergen Reactions  . Ciprofloxacin Hcl     Unknown   . Peanut-Containing Drug Products     Unknown     Diagnostic Studies/Procedures    LHC: 10/03/2015  Conclusion     Ost LAD lesion, 100 %stenosed.  LIMA and is normal in caliber and widely patent  Mid LAD to Dist LAD lesion, 100 %stenosed.  2nd Mrg lesion, 40 %stenosed.  Origin to Prox Graft lesion, 100 %stenosed.  Prox Cx to Dist Cx lesion, 20 %stenosed.  Mid RCA-1 lesion, 40 %stenosed.  Mid RCA-2 lesion, 100 %stenosed.  Ost Cx lesion, 95 %stenosed.  A STENT RESOLUTE INTEG 4.0X15 drug eluting stent was successfully placed.  Post intervention, there is a 0% residual stenosis.  There is moderate left ventricular systolic dysfunction.  LV end diastolic pressure is mildly elevated.  The left ventricular ejection fraction is 35-45% by visual estimate.   1. Severe 3 vessel obstructive CAD.     - 100% ostial LAD    -  95% in stent restenosis in the distal left main/ostial LCx    - 100% mid RCA. Some left to right collaterals to the distal vessel.  2. Patent LIMA to the second diagonal and LAD 3. Occluded SVG to the first diagonal and OM 4. Moderate LV dysfunction 5. Mildly elevated LVEDP 6. Successful repeat stenting of the left main/ostial LCx with DES using IVUS guidance.   Plan: DAPT for one year. Patient may be a candidate for the TWILIGHT trial. Risk factor modification.    _____________   History of Present Illness     65 y.o. male with a history of  CAD (s/p CABG 1994 with LIMA to D2 and LAD, SVG to D1 and ramus intermedius, acute inferior MI 2001 [facility] ICM (EF 48% in 2012), HTN, nephrolithiasis, obesity, mild CKD II-III (prior Cr 1.3-1.4). NSTEMI 02/2015 w/ 3.5 x 16 mm Promus Premier DES oCFX; 100% LAD and RCA, patent LIMA-D2-LAD with 50% stenosis, SVG-D1-OM 100%, distal limb from D1-OM okay, EF 55-60% by echo. [facility] with reports of progressive chest pain associated with dyspnea, nausea/vomiting and diaphoresis. Reports he began getting chest pain about 2 months prior to presenting to the ED, but has progressively gotten worse.   Hospital Course     Consultants: Case Management   He was admitted from the ED with plans to undergo LHC, along with cycling enzymes, with IV hydration. There was also mention of noncompliance with home medications, stating he could not afford them. A consult was made to case management in regards to this. He underwent LHC with Dr. [place] on 10/03/2015 showing showing known severe obstructive CAD (100% ostial LAD, 95% in stent restenosis in distal left main/ostial LCx, 100% mid RCA), patent LIMA to 2nd diag and LAD, occluded SVG to the 1st diag and OM. Successful repeat stenting of the left main/ostial LCx with DES. Recommendations to remain on DAPT for one year. ASA, Brilinta. He has been placed in the TWILIGHT study.   On 10/04/2015  he was seen and assessed by Dr. Jens Som and determined stable for discharge home. Morning Cr, Hgb stable post cath. Trop trend 0.43>>0.36>>0.49, remained without angina. He will be discharged home on ASA, Brilinta, Statin, ACEI and BB. Case management has assisted with his financial situation, and he will be given Rx to take to the [facility] at the time of discharge. He has been counseled multiple times in regards to compliance with his medications. Will need a repeat echo within 4-6 weeks. I have arranged for a TCM appt in the  office.  _____________  Discharge Vitals Blood pressure (!) 142/70, pulse 82, temperature 97.7 F (36.5 C), temperature source Oral, resp. rate (!) 21, height 5\' 10"  (1.778 m), weight 229 lb 15 oz (104.3 kg), SpO2 99 %.  Filed Weights   10/03/15 0757 10/04/15 0400  Weight: 230 lb (104.3 kg) 229 lb 15 oz (104.3 kg)    Labs & Radiologic Studies    CBC  Recent Labs  10/03/15 0810 10/04/15 0409  WBC 10.1 10.4  HGB 15.2 13.9  HCT 45.0 42.7  MCV 92.8 95.5  PLT 185 175   Basic Metabolic Panel  Recent Labs  10/03/15 0810 10/04/15 0409  NA 137 137  K 3.8 3.9  CL 105 104  CO2 24 26  GLUCOSE 121* 117*  BUN 13 9  CREATININE 1.31* 1.23  CALCIUM 9.6 9.0   Liver Function Tests  Recent Labs  10/04/15 0409  AST 20  ALT 12*  ALKPHOS 54  BILITOT 1.3*  PROT 6.2*  ALBUMIN 3.4*   No results for input(s): LIPASE, AMYLASE in the last 72 hours. Cardiac Enzymes  Recent Labs  10/03/15 1612 10/03/15 2155 10/04/15 0409  TROPONINI 0.43* 0.36* 0.49*   BNP Invalid input(s): POCBNP D-Dimer No results for input(s): DDIMER in the last 72 hours. Hemoglobin A1C No results for input(s): HGBA1C in the last 72 hours. Fasting Lipid Panel  Recent Labs  10/04/15 0409  CHOL 132  HDL 34*  LDLCALC 82  TRIG 78  CHOLHDL 3.9   Thyroid Function Tests No results for input(s): TSH, T4TOTAL, T3FREE, THYROIDAB in the last 72 hours.  Invalid input(s): FREET3 _____________  Dg Chest 2 View  Result Date: 10/03/2015 CLINICAL DATA:  Chest pain for few days, possible CABG EXAM: CHEST  2 VIEW COMPARISON:  02/18/2015 FINDINGS: Cardiomediastinal silhouette is stable. Status post CABG again noted. No infiltrate or pleural effusion. No pulmonary edema. Mild degenerative changes thoracic spine. IMPRESSION: No active cardiopulmonary disease.  Status post CABG. Electronically Signed   By: Natasha Mead M.D.   On: 10/03/2015 08:51   Disposition   Pt is being discharged home today in good  condition.  Follow-up Plans & Appointments    Follow-up Information     [facility] Follow up on 10/09/2015.   Why:  9 am for follow up apt Contact information: [address] [facility] [address] [phone]       Azalee Course, [place] Follow up on 10/16/2015.   Specialties:  Cardiology, Radiology Why:  2pm for your hospital follow up with Dr. Ludwig Clarks PA Wynema Birch. Contact information: [address] [place] [address] [phone]          Discharge Instructions    (HEART FAILURE PATIENTS) Call MD:  Anytime you have any of the following symptoms: 1) 3 pound weight gain in 24 hours or 5 pounds in 1 week 2) shortness of breath, with or without a dry hacking  cough 3) swelling in the hands, feet or stomach 4) if you have to sleep on extra pillows at night in order to breathe.    Complete by:  As directed   AMB Referral to Cardiac Rehabilitation - Phase II    Complete by:  As directed   Diagnosis:  Coronary Stents   Amb Referral to Cardiac Rehabilitation    Complete by:  As directed   Diagnosis:   PTCA NSTEMI Coronary Stents     Call MD for:  redness, tenderness, or signs of infection (pain, swelling, redness, odor or green/yellow discharge around incision site)    Complete by:  As directed   Diet - low sodium heart healthy    Complete by:  As directed   Discharge instructions    Complete by:  As directed   Radial Site Care Refer to this sheet in the next few weeks. These instructions provide you with information on caring for yourself after your procedure. Your caregiver may also give you more specific instructions. Your treatment has been planned according to current medical practices, but problems sometimes occur. Call your caregiver if you have any problems or questions after your procedure. HOME CARE INSTRUCTIONS You may shower the day after the procedure.Remove the bandage (dressing) and gently wash the site with plain  soap and water.Gently pat the site dry.  Do not apply powder or lotion to the site.  Do not submerge the affected site in water for 3 to 5 days.  Inspect the site at least twice daily.  Do not flex or bend the affected arm for 24 hours.  No lifting over 5 pounds (2.3 kg) for 5 days after your procedure.  Do not drive home if you are discharged the same day of the procedure. Have someone else drive you.  You may drive 24 hours after the procedure unless otherwise instructed by your caregiver.  What to expect: Any bruising will usually fade within 1 to 2 weeks.  Blood that collects in the tissue (hematoma) may be painful to the touch. It should usually decrease in size and tenderness within 1 to 2 weeks.  SEEK IMMEDIATE MEDICAL CARE IF: You have unusual pain at the radial site.  You have redness, warmth, swelling, or pain at the radial site.  You have drainage (other than a small amount of blood on the dressing).  You have chills.  You have a fever or persistent symptoms for more than 72 hours.  You have a fever and your symptoms suddenly get worse.  Your arm becomes pale, cool, tingly, or numb.  You have heavy bleeding from the site. Hold pressure on the site.   Increase activity slowly    Complete by:  As directed      Discharge Medications   Current Discharge Medication List    START taking these medications   Details  aspirin 81 MG chewable tablet Chew 1 tablet (81 mg total) by mouth daily. Qty: 30 tablet, Refills: 11      CONTINUE these medications which have CHANGED   Details  atorvastatin (LIPITOR) 80 MG tablet Take 1 tablet (80 mg total) by mouth daily. Qty: 30 tablet, Refills: 11    lisinopril (PRINIVIL,ZESTRIL) 10 MG tablet Take 1 tablet (10 mg total) by mouth daily. Qty: 30 tablet, Refills: 11    metoprolol tartrate (LOPRESSOR) 25 MG tablet Take 1 tablet (25 mg total) by mouth 2 (two) times daily. Qty: 60 tablet, Refills: 11    nitroGLYCERIN (NITROSTAT) 0.4  MG  SL tablet Place 1 tablet (0.4 mg total) under the tongue every 5 (five) minutes x 3 doses as needed for chest pain. Qty: 25 tablet, Refills: 3    ticagrelor (BRILINTA) 90 MG TABS tablet Take 1 tablet (90 mg total) by mouth 2 (two) times daily. Qty: 60 tablet, Refills: 0      STOP taking these medications     aspirin 81 MG EC tablet          Aspirin prescribed at discharge?  Yes High Intensity Statin Prescribed? (Lipitor 40-80mg  or Crestor 20-40mg ): Yes Beta Blocker Prescribed? Yes For EF <40%, was ACEI/ARB Prescribed? Yes ADP Receptor Inhibitor Prescribed? (i.e. Plavix etc.-Includes Medically Managed Patients): Yes For EF <40%, Aldosterone Inhibitor Prescribed? No:  Was EF assessed during THIS hospitalization? Yes Was Cardiac Rehab II ordered? (Included Medically managed Patients): Yes   Outstanding Labs/Studies   None, Echo in 4-6 weeks.  Duration of Discharge Encounter   Greater than 30 minutes including physician time.  Signed, Laverda Page NP-C 10/04/2015, 10:54 AM

## 2015-10-04 NOTE — Progress Notes (Signed)
CARDIAC REHAB PHASE I   PRE:  Rate/Rhythm: 80 SR    BP: sitting 142/70    SaO2:   MODE:  Ambulation: 1000 ft   POST:  Rate/Rhythm: 84 SR    BP: sitting 128/89     SaO2:   Tolerated well, no c/o. Ed completed, pt voicing understanding. He admits to lack of seriousness regarding his health. Discussed importance of med compliance for longevity and quality. Also emphasized exercise's role. Will send referral to [place] [facility].  [phone]   Harriet MassonRandi Kristan Catelyn Friel CES, ACSM 10/04/2015 9:00 AM

## 2015-10-04 NOTE — Telephone Encounter (Signed)
New Message  Lillia AbedLindsay called to schedule TOC appt 9.6.17 @ 2:00 pm with APP Azalee CourseHao Meng.

## 2015-10-04 NOTE — Progress Notes (Signed)
TWILIGHT Informed Consent   Subject Name: William Matthews  Subject met inclusion and exclusion criteria.  The informed consent form, study requirements and expectations were reviewed with the subject and questions and concerns were addressed prior to the signing of the consent form.  The subject verbalized understanding of the trail requirements.  The subject agreed to participate in the TWILIGHT trial and signed the informed consent.  The informed consent was obtained prior to performance of any protocol-specific procedures for the subject.  A copy of the signed informed consent was given to the subject and a copy was placed in the subject's medical record.  Jake Bathe, RN 10/04/2015,0725AM   [name] received one bottle of ticagrelor and one bottle of aspirin. Instructions were given on how to take and he verbalized understanding.

## 2015-10-04 NOTE — Progress Notes (Signed)
Patient Name: William Matthews Date of Encounter: 10/04/2015  Hospital Problem List     Principal Problem:   Unstable angina pectoris [facility]) Active Problems:   Unstable angina (HCC)    Subjective   Denies any further episodes of chest pain or dyspnea during the night.   Inpatient Medications    . aspirin  81 mg Oral Daily  . atorvastatin  80 mg Oral Daily  . lisinopril  10 mg Oral Daily  . metoprolol tartrate  25 mg Oral BID  . sodium chloride flush  3 mL Intravenous Q12H  . sodium chloride flush  3 mL Intravenous Q12H  . ticagrelor  90 mg Oral BID    Vital Signs    Vitals:   10/03/15 2030 10/03/15 2036 10/03/15 2100 10/04/15 0400  BP: 130/85 (!) 144/83 (!) 145/83 118/63  Pulse: 74 72 74 62  Resp: 20  (!) 23 19  Temp:    (!) 96.8 F (36 C)  TempSrc:    Axillary  SpO2: 99%  98% 99%  Weight:    229 lb 15 oz (104.3 kg)  Height:        Intake/Output Summary (Last 24 hours) at 10/04/15 1610 Last data filed at 10/03/15 1915  Gross per 24 hour  Intake              490 ml  Output              300 ml  Net              190 ml   Filed Weights   10/03/15 0757 10/04/15 0400  Weight: 230 lb (104.3 kg) 229 lb 15 oz (104.3 kg)    Physical Exam    General: Older male, NAD. Neuro: Alert and oriented X 3. Moves all extremities spontaneously. Psych: Normal affect. HEENT:  Normal  Neck: Supple without bruits or JVD. Lungs:  Resp regular and unlabored, CTA. Heart: RRR no s3, s4, or murmurs. Abdomen: Soft, non-tender, non-distended, BS + x 4.  Extremities: No clubbing, cyanosis or edema. DP/PT/Radials 2+ and equal bilaterally. Left Radial site without hematoma or bruising.   Labs    CBC  Recent Labs  10/03/15 0810 10/04/15 0409  WBC 10.1 10.4  HGB 15.2 13.9  HCT 45.0 42.7  MCV 92.8 95.5  PLT 185 175   Basic Metabolic Panel  Recent Labs  10/03/15 0810 10/04/15 0409  NA 137 137  K 3.8 3.9  CL 105 104  CO2 24 26  GLUCOSE 121* 117*  BUN 13 9  CREATININE  1.31* 1.23  CALCIUM 9.6 9.0   Liver Function Tests  Recent Labs  10/04/15 0409  AST 20  ALT 12*  ALKPHOS 54  BILITOT 1.3*  PROT 6.2*  ALBUMIN 3.4*   No results for input(s): LIPASE, AMYLASE in the last 72 hours. Cardiac Enzymes  Recent Labs  10/03/15 1612 10/03/15 2155 10/04/15 0409  TROPONINI 0.43* 0.36* 0.49*   BNP Invalid input(s): POCBNP D-Dimer No results for input(s): DDIMER in the last 72 hours. Hemoglobin A1C No results for input(s): HGBA1C in the last 72 hours. Fasting Lipid Panel  Recent Labs  10/04/15 0409  CHOL 132  HDL 34*  LDLCALC 82  TRIG 78  CHOLHDL 3.9   Thyroid Function Tests No results for input(s): TSH, T4TOTAL, T3FREE, THYROIDAB in the last 72 hours.  Invalid input(s): FREET3  Telemetry    SR  ECG    SR , Incomplete RBBB  Radiology  Dg Chest 2 View  Result Date: 10/03/2015 CLINICAL DATA:  Chest pain for few days, possible CABG EXAM: CHEST  2 VIEW COMPARISON:  02/18/2015 FINDINGS: Cardiomediastinal silhouette is stable. Status post CABG again noted. No infiltrate or pleural effusion. No pulmonary edema. Mild degenerative changes thoracic spine. IMPRESSION: No active cardiopulmonary disease.  Status post CABG. Electronically Signed   By: Natasha MeadLiviu  Pop M.D.   On: 10/03/2015 08:51    Assessment & Plan    65 y.o. male with a history of CAD (s/p CABG 1994 with LIMA to D2 and LAD, SVG to D1 and ramus intermedius, acute inferior MI 2001 s/p [facility] ICM (EF 48% in 2012), HTN, nephrolithiasis, obesity, mild CKD II-III (prior Cr 1.3-1.4). NSTEMI 02/2015 w/ 3.5 x 16 mm Promus Premier DES oCFX; 100% LAD and RCA, patent LIMA-D2-LAD with 50% stenosis, SVG-D1-OM 100%, distal limb from D1-OM okay, EF 55-60% by echo. [facility] pain with dyspnea and diaphoresis on 10/03/2015.  1. NSTEMI: Underwent a LHC yesterday with Dr. [place] showing known severe obstructive CAD (100% ostial LAD, 95% in stent  restenosis in distal left main/ostial LCx, 100% mid RCA), patent LIMA to 2nd diag and LAD, occluded SVG to the 1st diag and OM. Successful repeat stenting of the left main/ostial LCx with DES.  -- Recommendations to remain on DAPT for one year. ASA, Brilinta. Considering TWILIGHT study. -- Morning Cr, Hgb stable post cath. Trop trend 0.43>>0.36>>0.49, remains without angina this morning. Needs to walk with cardiac rehab.  -- Have been issues with compliance with medications, case manager to see patient in regards. Reports he has no medications at home and needs assistance with obtaining.   2. HTN: Blood pressure stable, will continue with current medications.  3. HLD: continue statin   Signed, Laverda PageLindsay Roberts NP-C Pager [phone] As above, patient seen and examined. He denies chest pain or dyspnea. Radial cath site with no hematoma. Status post PCI of left circumflex with mild elevation in enzymes consistent with non-ST elevation myocardial infarction. Plan to continue aspirin, brilinta, statin, ACEI and metoprolol. Patient instructed on importance of compliance. Continue ACE inhibitor and beta blocker for reduced LV function. Repeat echocardiogram in 4-6 weeks. Discharged today in follow-up in one week for transition of care appointment. Follow-up with me in 6 weeks. > 30 min PA and physician time D2 Olga MillersBrian Abdulahi Schor, MD

## 2015-10-04 NOTE — Care Management Note (Signed)
Case Management Note  Patient Details  Name: Ernestina PatchesRoger G Moors MRN: [id number] Date of Birth: 07/27/1950  Subjective/Objective: Patient is indep pta , s/p intervention, he will be on Twilight Study, NCM informed him that [facility] [place] [facility].  He will need to go to [facility] clinic to for follow up apt and to get any other medications prescribed to him today, he will need paper scripts because he has no money to pay for medicatins.                     Action/Plan:   Expected Discharge Date:                  Expected Discharge Plan:  Home/Self Care  In-House Referral:     Discharge planning Services  CM Consult, Follow-up appt scheduled, [facility]  Post Acute Care Choice:    Choice offered to:     DME Arranged:    DME Agency:     HH Arranged:    HH Agency:     Status of Service:  Completed, signed off  If discussed at [employer] Length of Stay Meetings, dates discussed:    Additional Comments:  Leone Havenaylor, Zakkiyya Barno Clinton, RN 10/04/2015, 9:42 AM

## 2015-10-07 MED FILL — Nitroglycerin IV Soln 100 MCG/ML in D5W: INTRA_ARTERIAL | Qty: 10 | Status: AC

## 2015-10-07 MED FILL — Lidocaine HCl Local Preservative Free (PF) Inj 1%: INTRAMUSCULAR | Qty: 30 | Status: AC

## 2015-10-07 NOTE — Telephone Encounter (Signed)
[facility].    Patient understands to follow up with provider Azalee CourseHao Meng PA on 10/16/15 at 2 PM at [facility].  Patient understands discharge instructions? yes  Patient understands medications and regiment? yes  Patient understands to bring all medications to this visit? yes    Pt denies further questions/concerns at this time.

## 2015-10-09 ENCOUNTER — Inpatient Hospital Stay: Payer: Self-pay

## 2015-10-16 ENCOUNTER — Ambulatory Visit (INDEPENDENT_AMBULATORY_CARE_PROVIDER_SITE_OTHER): Payer: Self-pay | Admitting: Pharmacist Clinician (PhC)/ Clinical Pharmacy Specialist

## 2015-10-16 ENCOUNTER — Encounter: Payer: Self-pay | Admitting: Physician Assistant

## 2015-10-16 ENCOUNTER — Ambulatory Visit (INDEPENDENT_AMBULATORY_CARE_PROVIDER_SITE_OTHER): Payer: Self-pay | Admitting: Physician Assistant

## 2015-10-16 VITALS — BP 116/72 | HR 67 | Ht 70.0 in | Wt 229.8 lb

## 2015-10-16 DIAGNOSIS — I255 Ischemic cardiomyopathy: Secondary | ICD-10-CM

## 2015-10-16 DIAGNOSIS — I10 Essential (primary) hypertension: Secondary | ICD-10-CM

## 2015-10-16 DIAGNOSIS — I25810 Atherosclerosis of coronary artery bypass graft(s) without angina pectoris: Secondary | ICD-10-CM

## 2015-10-16 DIAGNOSIS — N182 Chronic kidney disease, stage 2 (mild): Secondary | ICD-10-CM

## 2015-10-16 MED ORDER — CARVEDILOL 3.125 MG PO TABS: 3.1250 mg | ORAL_TABLET | Freq: Two times a day (BID) | ORAL | 11 refills | Status: DC

## 2015-10-16 NOTE — Progress Notes (Signed)
10/16/2015 William Matthews Nov 30, 1950 [id number]   HPI:  William Matthews is a 65 y.o. male patient of Dr. Jens Som, who was hospitalized from Aug 24 to Aug 25 with a primary diagnosis of NSTEMI.   He had previous admission with stent placement in January of this year.  There has been some concern in regards to his ability to pay for medications, and thus compliance.  He was referred to [facility] to get his discharge medications.   Today he states no problems with picking up his meds, cost was not a concern.   Informed him that carvedilol and lisinopril are both on the [employer] $4 list.    Patient did admit to stopping metoprolol for past week due to problems with erectile dysfunction.  Asked for samples of Viagra from  [facility].    SCr - 85.6 LDL - 82   Medication  Dose Contra-indication  Antiplatelet Brilinta 90 mg   Aspirin EC  81 mg   Beta-blocker Metoprolol 25 mg   ACEI/ARB Lisinopril  10 mg   High intensity statin Atorvastatin  80 mg   Nitroglycerin Nitrostat 0.4 mg    Issues/Concerns:  Current Outpatient Prescriptions  Medication Sig Dispense Refill  . AMBULATORY NON FORMULARY MEDICATION Take 90 mg by mouth 2 (two) times daily. Medication Name: Jeraldine Loots study drug provided.    . AMBULATORY NON FORMULARY MEDICATION Take 81 mg by mouth daily. Medication Name: Aspirin, TWILIGHT research study drug provided    . atorvastatin (LIPITOR) 80 MG tablet Take 1 tablet (80 mg total) by mouth daily. 30 tablet 11  . lisinopril (PRINIVIL,ZESTRIL) 10 MG tablet Take 1 tablet (10 mg total) by mouth daily. 30 tablet 11  . metoprolol tartrate (LOPRESSOR) 25 MG tablet Take 1 tablet (25 mg total) by mouth 2 (two) times daily. 60 tablet 11  . nitroGLYCERIN (NITROSTAT) 0.4 MG SL tablet Place 1 tablet (0.4 mg total) under the tongue every 5 (five) minutes x 3 doses as needed for chest pain. 25 tablet 3   No current facility-administered medications for this visit.     Allergies   Allergen Reactions  . Ciprofloxacin Hcl     Unknown   . Peanut-Containing Drug Products     Unknown     Past Medical History:  Diagnosis Date  . Anxiety   . CAD (coronary artery disease)    a. s/p CABG 1994 with LIMA to D2 and LAD, SVG to D1 and ramus intermedius. b. acute inferior MI 2001 s/p rescue PTCA/stent to [facility].  b. NSTEMI 02/2014: s/p LHC with DES to oLCx c. 09/2015 PCI with DES to left main/ostial LCx  . Chronic bronchitis (HCC)    "they say I get it q yr" (10/03/2015)  . CKD (chronic kidney disease), stage II   . Depression   . ED (erectile dysfunction)   . GERD (gastroesophageal reflux disease)   . Hyperlipidemia   . Hypertension   . Ischemic cardiomyopathy    a. EF 48% by nuc in 2012.  . Myocardial infarction (HCC) "several"  . Nephrolithiasis   . Obesity   . Prediabetes     All medications have been reviewed with the patient.  Had a frank discussion of the drug interaction between Viagra and nitroglycerin.  He is now aware of this.  He was switched from metoprolol to carvedilol 3.125 mg today to see if this would work without causing the ED.     Phillips Hay PharmD CPP [facility]

## 2015-10-16 NOTE — Patient Instructions (Addendum)
Medications for a Healthy Heart        My medicines help me. Live longer Feel better Stay out of the hospital      My prescription  Beta blocker    Metoprolol tartrate 25 mg  Beta blockers make your heart strong by blocking chemicals that make your heart work too hard. They may cause a slow heartbeat and low blood pressure.   ACE inhibitor/ARB    Lisinopril 10 mg  ACE inhibitors or ARBs will help to keep your blood pressure lower and to reduce strain on the heart. Call your doctor if you experience a dry cough or have lip/face tingling or swelling.  Statin     Atorvastatin 80 mg  Statins will help to lower your cholesterol and prevent the build-up of plaque in your arteries that can lead to another heart attack and cause heart disease. Let your doctor know if you feel any muscle aches or pains.  Aspirin     Enteric coated 81 mg  Aspirin helps to prevent future heart attacks and heart disease. Because it is an anti-platelet drug, it makes the blood thinner and may cause bleeds. Let your doctor know if it looks like there is blood in your stool or if you have any upcoming procedures or surgeries.  Anti-platelet      Brilinta 90 mg  You may be on another anti-platelet drug for at least a year after your heart attack. It works with aspirin to keep your blood thin and prevent future heart attacks. It may also cause bleeding-let your doctor know if  it looks like you have blood in your stool or if you have any upcoming procedures or surgeries.  Nitroglycerin    Nitrostat .04 mg  Nitroglycerin should only be used as needed if you experience chest pain at home. Call the hospital if you need to take more than 1 nitroglycerin. It may cause dizziness and headache.    And remember.  ? Cardiac rehab, exercise, and quitting smoking are some of the best ways to make your heart stronger and help you to live longer. ? Call us if you are interested in counseling or medications to help you quit  smoking.

## 2015-10-16 NOTE — Progress Notes (Signed)
Cardiology Office Note    Date:  10/16/2015   ID:  William Matthews, DOB February 17, 1950, MRN [id number]  PCP:  No PCP Per Patient  Cardiologist:  Dr. Jens Som  Chief Complaint  Patient presents with  . Transitions Of Care    seen for Dr. Jens Som, 14 day Transition of Care visit    History of Present Illness:  William Matthews is a 65 y.o. male with PMH of CAD (s/p CABG 1994 with LIMA to D2 and LAD, SVG to D1 and ramus intermedius, acute inferior MI 2001 s/p rescue PTCA/stent to [facility]), ICM (EF 48% in 2012), HTN, nephrolithiasis, obesity, mild CKD II-III (prior Cr 1.3-1.4). He previously had NSTEMI in January 2017 with 3.5 x 16 mm Promus Premier DES to ostial left circumflex, 100% LAD and RCA occlusion, patent LIMA-D2-LAD with 50% stenosis, SVG-D1-OM 100%, distal to and from D1-OM okay, EF 55-60% by echo. After discharge, he was given 30 day free coupon, however due to lack of insurance and financial problem, he stopped Brilinta after finishing the 30 day supply. He presented back to the hospital on 10/03/2015 with exertional chest pain. His symptom was concerning for unstable angina, he was ruled in for NSTEMI by enzyme markers, the decision was made to proceed with cardiac catheterization. Cardiac catheterization was performed on 10/03/2015 which revealed severe three-vessel CAD, 100% ostial LAD and mid RCA with left-to-right collaterals to distal vessel, 95% in-stent restenosis in the distal left main/ostial left circumflex treated with DES under IVUS guidance. His EF was 35-45% by visual estimate. Post procedure, he was involved Twilight study and placed on aspirin and Brilinta. He presents today for 14 days transition of care follow-up.  He denies any chest discomfort or shortness of breath. I have reviewed with him his recent cath report. I have repeatedly emphasized the importance of compliance with DAPT. Fortunately, since he is enrolled in the twilight study, he will obtain free Brilinta from the  study. He has not taken his metoprolol for the past 7 days as it caused erectile dysfunction. He wished to find another beta blocker that has less erectile dysfunction side effect. After discussing with our clinical pharmacist, we have decided to switch his metoprolol to carvedilol 3.125 mg twice a day. Although bisoprolol is alternative that has less side effect, however it will not offer the same cardioprotective effect. When asked about his PCP, he says he was hoping we would become his PCP. However given his comorbidities, I have have recommended him to establish with internal medicine or family medicine doctor. He seems to have significant financial constraint, I have referred him to [facility] and [employer]. We can consider obtaining a repeat echocardiogram on follow-up to reassess his ejection fraction. I have further recommended him to avoid taking Viagra and sublingual nitroglycerin within 48 hours of each other as combination of the two can potentially drop his blood pressure significantly.   Past Medical History:  Diagnosis Date  . Anxiety   . CAD (coronary artery disease)    a. s/p CABG 1994 with LIMA to D2 and LAD, SVG to D1 and ramus intermedius. b. acute inferior MI 2001 s/p rescue PTCA/stent to [facility].  b. NSTEMI 02/2014: s/p LHC with DES to oLCx c. 09/2015 PCI with DES to left main/ostial LCx  . Chronic bronchitis (HCC)    "they say I get it q yr" (10/03/2015)  . CKD (chronic kidney disease), stage II   . Depression   . ED (erectile dysfunction)   .  GERD (gastroesophageal reflux disease)   . Hyperlipidemia   . Hypertension   . Ischemic cardiomyopathy    a. EF 48% by nuc in 2012.  . Myocardial infarction (HCC) "several"  . Nephrolithiasis   . Obesity   . Prediabetes     Past Surgical History:  Procedure Laterality Date  . CARDIAC CATHETERIZATION N/A 02/19/2015   Procedure: Left Heart Cath and Cors/Grafts Angiography;  Surgeon: Kathleene Hazelhristopher D McAlhany, MD;  LAD & RCA  100%, LIMA-LAD 50%, SVG-OM-D1 100% stenosis, distal limb D1-OM ok, CFX 99%  . CARDIAC CATHETERIZATION  02/19/2015   Procedure: Coronary Stent Intervention;  Surgeon: Kathleene Hazelhristopher D McAlhany, MD; 3.5 x 16 mm Promus Premier DES to the ostial CFX   . CARDIAC CATHETERIZATION N/A 10/03/2015   Procedure: Left Heart Cath and Cors/Grafts Angiography;  Surgeon: Peter M SwazilandJordan, MD;  [facility];  Service: Cardiovascular;  Laterality: N/A;  . CARDIAC CATHETERIZATION N/A 10/03/2015   Procedure: Coronary Stent Intervention;  Surgeon: Peter M SwazilandJordan, MD;  [facility];  Service: Cardiovascular;  Laterality: N/A;  DES- Resolute 4.0x15 to distal left main into the ostial circumflex artery  . CARDIAC CATHETERIZATION N/A 10/03/2015   Procedure: Intravascular Ultrasound/IVUS;  Surgeon: Peter M SwazilandJordan, MD;  [facility];  Service: Cardiovascular;  Laterality: N/A;  . CORONARY ANGIOPLASTY WITH STENT PLACEMENT  1994 - 2001 X 5   "stents each time"  . CORONARY ANGIOPLASTY WITH STENT PLACEMENT  10/03/2015  . CORONARY ARTERY BYPASS GRAFT  1994   LIMA-D2-LAD, SVG-D1-OM2  . CYSTOSCOPY W/ STONE MANIPULATION      Current Medications: Outpatient Medications Prior to Visit  Medication Sig Dispense Refill  . AMBULATORY NON FORMULARY MEDICATION Take 90 mg by mouth 2 (two) times daily. Medication Name: Jeraldine LootsBrilinta Twilight study drug provided.    . AMBULATORY NON FORMULARY MEDICATION Take 81 mg by mouth daily. Medication Name: Aspirin, TWILIGHT research study drug provided    . atorvastatin (LIPITOR) 80 MG tablet Take 1 tablet (80 mg total) by mouth daily. 30 tablet 11  . lisinopril (PRINIVIL,ZESTRIL) 10 MG tablet Take 1 tablet (10 mg total) by mouth daily. 30 tablet 11  . nitroGLYCERIN (NITROSTAT) 0.4 MG SL tablet Place 1 tablet (0.4 mg total) under the tongue every 5 (five) minutes x 3 doses as needed for chest pain. 25 tablet 3  . metoprolol tartrate (LOPRESSOR) 25 MG tablet Take 1  tablet (25 mg total) by mouth 2 (two) times daily. 60 tablet 11   No facility-administered medications prior to visit.      Allergies:   Ciprofloxacin hcl and Peanut-containing drug products   Social History   Social History  . Marital status: Divorced    Spouse name: N/A  . Number of children: 3  . Years of education: N/A   Occupational History  . Retired    Social History Main Topics  . Smoking status: Never Smoker  . Smokeless tobacco: Never Used  . Alcohol use No  . Drug use: No  . Sexual activity: Yes   Other Topics Concern  . None   Social History Narrative   Twenty years ago CAD and CABG,no chest pain since per patient but was eval., In ED for chest pain 1 month prev.-MI  r/o but no stress done. Took meds x1 month after MI but caused ED so stopped them had testosterone check >199,wants cialis or testosterone replacement. "if i can't have sex,life isn't worth living".     Family  History:  The patient's family history includes Coronary artery disease in his father and mother.   ROS:   Please see the history of present illness.    ROS All other systems reviewed and are negative.   PHYSICAL EXAM:   VS:  BP 116/72   Pulse 67   Ht 5\' 10"  (1.778 m)   Wt 229 lb 12.8 oz (104.2 kg)   BMI 32.97 kg/m    GEN: Well nourished, well developed, in no acute distress  HEENT: normal  Neck: no JVD, carotid bruits, or masses Cardiac: RRR; no murmurs, rubs, or gallops,no edema  Respiratory:  clear to auscultation bilaterally, normal work of breathing GI: soft, nontender, nondistended, + BS MS: no deformity or atrophy  Skin: warm and dry, no rash Neuro:  Alert and Oriented x 3, Strength and sensation are intact Psych: euthymic mood, full affect  Wt Readings from Last 3 Encounters:  10/16/15 229 lb 12.8 oz (104.2 kg)  10/04/15 229 lb 15 oz (104.3 kg)  02/28/15 243 lb (110.2 kg)      Studies/Labs Reviewed:   EKG:  EKG is ordered today.  The ekg ordered today  demonstrates NSR without significant ST-T wave changes  Recent Labs: 02/18/2015: B Natriuretic Peptide 172.9; TSH 2.053 10/04/2015: ALT 12; BUN 9; Creatinine, Ser 1.23; Hemoglobin 13.9; Platelets 175; Potassium 3.9; Sodium 137   Lipid Panel    Component Value Date/Time   CHOL 132 10/04/2015 0409   TRIG 78 10/04/2015 0409   HDL 34 (L) 10/04/2015 0409   CHOLHDL 3.9 10/04/2015 0409   VLDL 16 10/04/2015 0409   LDLCALC 82 10/04/2015 0409    Additional studies/ records that were reviewed today include:   Cath 02/19/2015 1. Severe native vessel CAD s/p 4V CABG with 2/4 patent bypass grafts.  2. Chronically occluded mid RCA beyond the stent. Distal vessel fills from left to right collaterals through the LIMA to LAD.  3. Occluded ostial LAD. Mid and distal vessel fills from the patent LIMA graft. The second diagonal fills from the LIMA as well and supplies the mid LAD. There is a segment of 100% occlusion in the mid LAD proximal to LIMA insertion. The distal LAD fills from the LIMA graft. There is a 50% anastomotic lesion of the LIMA to the LAD.  4. Severe stenosis ostial Circumflex. The vein graft to the OM is occluded. The sequential segment of the vein graft to Diagonal 1 is patent.  5. Successful PTCA/DES x 1 ostium Circumflex.   Recommendations: Continue ASA and Brilinta for one year. Continue statin. Start beta blocker. Follow up on echo results.    Echo 02/19/2015 LV EF: 55% -   60%  ------------------------------------------------------------------- Indications:      Chest pain 786.51.  ------------------------------------------------------------------- History:   PMH:   Coronary artery disease.  Risk factors:  Chronic kidney disease. Ischemic Cardiomyopathy. Hypertension.  ------------------------------------------------------------------- Study Conclusions  - Left ventricle: The cavity size was normal. Wall thickness was   normal. Systolic function was normal. The  estimated ejection   fraction was in the range of 55% to 60%. Wall motion was normal;   there were no regional wall motion abnormalities. Features are   consistent with a pseudonormal left ventricular filling pattern,   with concomitant abnormal relaxation and increased filling   pressure (grade 2 diastolic dysfunction). - Aortic valve: There was no stenosis. - Mitral valve: There was trivial regurgitation. - Left atrium: The atrium was mildly dilated. - Right ventricle: The cavity size was  normal. Systolic function   was normal. - Right atrium: The atrium was mildly dilated. - Pulmonary arteries: PA peak pressure: 29 mm Hg (S). - Inferior vena cava: The vessel was normal in size. The   respirophasic diameter changes were in the normal range (>= 50%),   consistent with normal central venous pressure.  Impressions:  - Normal LV size and systolic function, EF 55-60%. Moderate   diastolic dysfunction. Normal RV size and systolic function. No   significant valvular abnormalities.   Cath 10/03/2015 Conclusion     Ost LAD lesion, 100 %stenosed.  LIMA and is normal in caliber and widely patent  Mid LAD to Dist LAD lesion, 100 %stenosed.  2nd Mrg lesion, 40 %stenosed.  Origin to Prox Graft lesion, 100 %stenosed.  Prox Cx to Dist Cx lesion, 20 %stenosed.  Mid RCA-1 lesion, 40 %stenosed.  Mid RCA-2 lesion, 100 %stenosed.  Ost Cx lesion, 95 %stenosed.  A STENT RESOLUTE INTEG 4.0X15 drug eluting stent was successfully placed.  Post intervention, there is a 0% residual stenosis.  There is moderate left ventricular systolic dysfunction.  LV end diastolic pressure is mildly elevated.  The left ventricular ejection fraction is 35-45% by visual estimate.   1. Severe 3 vessel obstructive CAD.     - 100% ostial LAD    -  95% in stent restenosis in the distal left main/ostial LCx    - 100% mid RCA. Some left to right collaterals to the distal vessel.  2. Patent LIMA to the  second diagonal and LAD 3. Occluded SVG to the first diagonal and OM 4. Moderate LV dysfunction 5. Mildly elevated LVEDP 6. Successful repeat stenting of the left main/ostial LCx with DES using IVUS guidance.   Plan: DAPT for one year. Patient may be a candidate for the TWILIGHT trial. Risk factor modification.       ASSESSMENT:    1. Coronary artery disease involving coronary bypass graft of native heart without angina pectoris   2. Essential hypertension   3. CKD (chronic kidney disease), stage II   4. Cardiomyopathy, ischemic      PLAN:  In order of problems listed above:  1. CAD (s/p CABG 1994 with LIMA to D2 and LAD, SVG to D1 and ramus intermedius, acute inferior MI 2001 s/p rescue PTCA/stent to [facility])  - NSTEMI in January 2017 with 3.5 x 16 mm Promus Premier DES to ostial left circumflex, 100% LAD and RCA occlusion, patent LIMA-D2-LAD with 50% stenosis, SVG-D1-OM 100%, distal to and from D1-OM okay, EF 55-60% by echo  - Stable from cardiac perspective, he did stop his metoprolol due to erectile dysfunction related to beta blocker side effect, after discussing with our clinical pharmacist, we have changed it to carvedilol 3.125 twice a day. I have discussed with him cross reactivity between Viagra/Cialis/Levitra and nitrates. Recent MI was triggered by noncompliance after he stopped taking Brilinta due to financial constraint, he is Twilight study which will provide him free Brilinta.  2. ICM with baseline EF 35-45%  - Echocardiogram obtained in January 2017 showed EF 55-60%, however after recent MI in August, his EF appears to be 35-45% on cardiac catheterization. He will see Dr. Jens Som in 2 months, at which time a consider a repeat echocardiogram to reassess ejection fraction.  3. HTN: His blood pressure is 116/72 today, well-controlled despite not on beta blocker for the past week.  4. CKD stage II: Will need to be followed by her PCP. [facility] [employer]. At first he wished cardiology to become his PCP, however I have advised him to establish with a internal medicine doctor.    Medication Adjustments/Labs and Tests Ordered: Current medicines are reviewed at length with the patient today.  Concerns regarding medicines are outlined above.  Medication changes, Labs and Tests ordered today are listed in the Patient Instructions below. Patient Instructions  Medication Instructions:  STOP Metoprolol and START Carvedilol 3.125 mg twice a day  Labwork: None Ordered  Testing/Procedures: None Ordered  Follow-Up: Your physician recommends that you schedule a follow-up appointment in: 2 Months with Dr Shelda Pal have been referred to [facility]   Any Other Special Instructions Will Be Listed Below (If Applicable).   If you need a refill on your cardiac medications before your next appointment, please call your pharmacy.      Ramond Dial, [place]  10/16/2015 6:12 PM    [facility] [address] [place], [place], [place]  [address] Phone: [phone]; Fax: [phone]

## 2015-10-16 NOTE — Patient Instructions (Signed)
Medication Instructions:  STOP Metoprolol and START Carvedilol 3.125 mg twice a day  Labwork: None Ordered  Testing/Procedures: None Ordered  Follow-Up: Your physician recommends that you schedule a follow-up appointment in: 2 Months with Dr Shelda Palrenshaw You have been referred to [facility]   Any Other Special Instructions Will Be Listed Below (If Applicable).   If you need a refill on your cardiac medications before your next appointment, please call your pharmacy.

## 2015-11-04 ENCOUNTER — Telehealth: Payer: Self-pay | Admitting: *Deleted

## 2015-11-04 ENCOUNTER — Encounter (HOSPITAL_COMMUNITY): Payer: Self-pay | Admitting: Emergency Medicine

## 2015-11-04 DIAGNOSIS — I252 Old myocardial infarction: Secondary | ICD-10-CM | POA: Insufficient documentation

## 2015-11-04 DIAGNOSIS — N182 Chronic kidney disease, stage 2 (mild): Secondary | ICD-10-CM | POA: Insufficient documentation

## 2015-11-04 DIAGNOSIS — Z91010 Allergy to peanuts: Secondary | ICD-10-CM | POA: Insufficient documentation

## 2015-11-04 DIAGNOSIS — I129 Hypertensive chronic kidney disease with stage 1 through stage 4 chronic kidney disease, or unspecified chronic kidney disease: Secondary | ICD-10-CM | POA: Insufficient documentation

## 2015-11-04 DIAGNOSIS — I2510 Atherosclerotic heart disease of native coronary artery without angina pectoris: Secondary | ICD-10-CM | POA: Insufficient documentation

## 2015-11-04 DIAGNOSIS — R600 Localized edema: Secondary | ICD-10-CM | POA: Insufficient documentation

## 2015-11-04 DIAGNOSIS — M79661 Pain in right lower leg: Secondary | ICD-10-CM | POA: Insufficient documentation

## 2015-11-04 DIAGNOSIS — Z951 Presence of aortocoronary bypass graft: Secondary | ICD-10-CM | POA: Insufficient documentation

## 2015-11-04 LAB — CBC WITH DIFFERENTIAL/PLATELET
BASOS ABS: 0 10*3/uL (ref 0.0–0.1)
BASOS PCT: 0 %
Eosinophils Absolute: 0.2 10*3/uL (ref 0.0–0.7)
Eosinophils Relative: 2 %
HEMATOCRIT: 39.8 % (ref 39.0–52.0)
HEMOGLOBIN: 12.9 g/dL — AB (ref 13.0–17.0)
LYMPHS PCT: 24 %
Lymphs Abs: 2.1 10*3/uL (ref 0.7–4.0)
MCH: 30.6 pg (ref 26.0–34.0)
MCHC: 32.4 g/dL (ref 30.0–36.0)
MCV: 94.3 fL (ref 78.0–100.0)
Monocytes Absolute: 0.5 10*3/uL (ref 0.1–1.0)
Monocytes Relative: 6 %
NEUTROS ABS: 5.8 10*3/uL (ref 1.7–7.7)
NEUTROS PCT: 68 %
Platelets: 193 10*3/uL (ref 150–400)
RBC: 4.22 MIL/uL (ref 4.22–5.81)
RDW: 12.8 % (ref 11.5–15.5)
WBC: 8.6 10*3/uL (ref 4.0–10.5)

## 2015-11-04 LAB — BASIC METABOLIC PANEL
ANION GAP: 9 (ref 5–15)
BUN: 16 mg/dL (ref 6–20)
CALCIUM: 9.3 mg/dL (ref 8.9–10.3)
CHLORIDE: 105 mmol/L (ref 101–111)
CO2: 23 mmol/L (ref 22–32)
Creatinine, Ser: 1.45 mg/dL — ABNORMAL HIGH (ref 0.61–1.24)
GFR calc non Af Amer: 48 mL/min — ABNORMAL LOW (ref >60)
GFR, EST AFRICAN AMERICAN: 56 mL/min — AB (ref >60)
Glucose, Bld: 112 mg/dL — ABNORMAL HIGH (ref 65–99)
POTASSIUM: 3.9 mmol/L (ref 3.5–5.1)
Sodium: 137 mmol/L (ref 135–145)

## 2015-11-04 LAB — PROTIME-INR
INR: 1.17
Prothrombin Time: 14.9 seconds (ref 11.4–15.2)

## 2015-11-04 NOTE — ED Triage Notes (Addendum)
Pt. reports right lower leg and right foot swelling/pain onset last week , denies injury /ambulatory , pedal pulses present .

## 2015-11-04 NOTE — Telephone Encounter (Signed)
[facility] Patient has not missed any doses of medication (Brilinta and ASA). Denies any bleeding or other adverse events. Patient states he has been having calf pain and swelling in lower leg for 1 week. I instructed patient to go to a doctor to get that checked to make sure he does not have a blood clot. Patient wants to know if he should go to Cardiology. I informed him no that he need to go to Urgent care of ER. He verbalized understanding. I will follow up with him again

## 2015-11-05 ENCOUNTER — Encounter (HOSPITAL_COMMUNITY): Payer: Self-pay

## 2015-11-05 ENCOUNTER — Emergency Department (HOSPITAL_COMMUNITY)
Admission: EM | Admit: 2015-11-05 | Discharge: 2015-11-05 | Disposition: A | Discharge status: Home / Self Care | Payer: Self-pay | Attending: Emergency Medicine | Admitting: Emergency Medicine

## 2015-11-05 DIAGNOSIS — M79661 Pain in right lower leg: Secondary | ICD-10-CM

## 2015-11-05 DIAGNOSIS — R609 Edema, unspecified: Secondary | ICD-10-CM

## 2015-11-05 MED ORDER — HYDROCODONE-ACETAMINOPHEN 5-325 MG PO TABS: 1.0000 | ORAL_TABLET | Freq: Four times a day (QID) | ORAL | 0 refills | Status: DC | PRN

## 2015-11-05 NOTE — ED Provider Notes (Signed)
[facility]-EMERGENCY DEPT Provider Note   CSN: [id number] Arrival date & time: 11/04/15  1952  By signing my name below, I, Suzan Slick. Elon Spanner, attest that this documentation has been prepared under the direction and in the presence of Zadie Rhine, MD.  Electronically Signed: Suzan Slick. Elon Spanner, ED Scribe. 11/05/15. 3:29 AM.   History   Chief Complaint Chief Complaint  Patient presents with  . Leg Swelling  . Foot Swelling   The history is provided by the patient. No language interpreter was used.  Leg Pain   This is a new problem. The current episode started more than 2 days ago. The problem occurs constantly. The problem has been gradually worsening. The pain is present in the right lower leg and right foot. The pain is moderate. Associated symptoms include limited range of motion. The symptoms are aggravated by activity. He has tried nothing for the symptoms. There has been no history of extremity trauma.    HPI Comments: William Matthews is a 65 y.o. male with a PMHx of CAD, CKD, HTN, hyperlipidemia, and MI who presents to the Emergency Department complaining of constant, worsening R lower extremity/R foot swelling with associated pain x 1 week. Pain is described as throbbing. Pt denies any recent injury or trauma, however, pt states he initially noted swelling after he was walking up an incline and "heard a pop to the back of my leg". Discomfort to leg is made worse with ambulation, weight bearing, and deep palpation. No alleviating factors at this time. No OTC medications or home remedies attempted prior to arrival. No recent chest pain, numbness, or tingling. No prior history of blood clots. PSHx includes cardiac catheterization and coronary angioplasty with stent placement 10/03/2015.  Pt also reports an ongoing cough and cold like symptoms x 1 week. He also reports intermittent fever over the last few days that has now resolved. No recent sick contacts.  PCP: No PCP Per Patient    Past  Medical History:  Diagnosis Date  . Anxiety   . CAD (coronary artery disease)    a. s/p CABG 1994 with LIMA to D2 and LAD, SVG to D1 and ramus intermedius. b. acute inferior MI 2001 s/p rescue PTCA/stent to [place].  b. NSTEMI 02/2014: s/p LHC with DES to oLCx c. 09/2015 PCI with DES to left main/ostial LCx  . Chronic bronchitis (HCC)    "they say I get it q yr" (10/03/2015)  . CKD (chronic kidney disease), stage II   . Depression   . ED (erectile dysfunction)   . GERD (gastroesophageal reflux disease)   . Hyperlipidemia   . Hypertension   . Ischemic cardiomyopathy    a. EF 48% by nuc in 2012.  . Myocardial infarction (HCC) "several"  . Nephrolithiasis   . Obesity   . Prediabetes     Patient Active Problem List   Diagnosis Date Noted  . Unstable angina (HCC) 10/03/2015  . NSTEMI (non-ST elevated myocardial infarction) (HCC) 02/20/2015  . Hyperlipidemia   . Prediabetes   . Obesity   . ED (erectile dysfunction)   . CAD (coronary artery disease)   . CKD (chronic kidney disease), stage II 02/18/2015  . Nonspecific abnormal unspecified cardiovascular function study 08/11/2010  . Unstable angina pectoris (HCC) 07/02/2010  . Essential hypertension 07/02/2010  . Erectile dysfunction 07/02/2010    Past Surgical History:  Procedure Laterality Date  . CARDIAC CATHETERIZATION N/A 02/19/2015   Procedure: Left Heart Cath and Cors/Grafts Angiography;  Surgeon: Kathleene Hazel,  MD;  LAD & RCA 100%, LIMA-LAD 50%, SVG-OM-D1 100% stenosis, distal limb D1-OM ok, CFX 99%  . CARDIAC CATHETERIZATION  02/19/2015   Procedure: Coronary Stent Intervention;  Surgeon: Kathleene Hazel, MD; 3.5 x 16 mm Promus Premier DES to the ostial CFX   . CARDIAC CATHETERIZATION N/A 10/03/2015   Procedure: Left Heart Cath and Cors/Grafts Angiography;  Surgeon: Peter M Swaziland, MD;  Location: [facility] INVASIVE CV LAB;  Service: Cardiovascular;  Laterality: N/A;  . CARDIAC CATHETERIZATION N/A 10/03/2015   Procedure:  Coronary Stent Intervention;  Surgeon: Peter M Swaziland, MD;  Location: [facility] INVASIVE CV LAB;  Service: Cardiovascular;  Laterality: N/A;  DES- Resolute 4.0x15 to distal left main into the ostial circumflex artery  . CARDIAC CATHETERIZATION N/A 10/03/2015   Procedure: Intravascular Ultrasound/IVUS;  Surgeon: Peter M Swaziland, MD;  Location: [facility] INVASIVE CV LAB;  Service: Cardiovascular;  Laterality: N/A;  . CORONARY ANGIOPLASTY WITH STENT PLACEMENT  1994 - 2001 X 5   "stents each time"  . CORONARY ANGIOPLASTY WITH STENT PLACEMENT  10/03/2015  . CORONARY ARTERY BYPASS GRAFT  1994   LIMA-D2-LAD, SVG-D1-OM2  . CYSTOSCOPY W/ STONE MANIPULATION         Home Medications    Prior to Admission medications   Medication Sig Start Date End Date Taking? Authorizing Provider  AMBULATORY NON FORMULARY MEDICATION Take 90 mg by mouth 2 (two) times daily. Medication Name: Jeraldine Loots study drug provided. 10/04/15   Kathleene Hazel, MD  AMBULATORY NON FORMULARY MEDICATION Take 81 mg by mouth daily. Medication Name: Aspirin, TWILIGHT research study drug provided 10/04/15   Kathleene Hazel, MD  atorvastatin (LIPITOR) 80 MG tablet Take 1 tablet (80 mg total) by mouth daily. 10/04/15   Arty Baumgartner, NP  carvedilol (COREG) 3.125 MG tablet Take 1 tablet (3.125 mg total) by mouth 2 (two) times daily. 10/16/15 01/14/16  Azalee Course, PA  lisinopril (PRINIVIL,ZESTRIL) 10 MG tablet Take 1 tablet (10 mg total) by mouth daily. 10/04/15   Arty Baumgartner, NP  nitroGLYCERIN (NITROSTAT) 0.4 MG SL tablet Place 1 tablet (0.4 mg total) under the tongue every 5 (five) minutes x 3 doses as needed for chest pain. 10/04/15   Arty Baumgartner, NP    Family History Family History  Problem Relation Age of Onset  . Coronary artery disease Mother     Died at age 23 of MI  . Coronary artery disease Father     MI age 65    Social History Social History  Substance Use Topics  . Smoking status: Never Smoker  .  Smokeless tobacco: Never Used  . Alcohol use No     Allergies   Ciprofloxacin hcl and Peanut-containing drug products   Review of Systems Review of Systems  Constitutional: Positive for fever.  Respiratory: Positive for cough.   Cardiovascular: Positive for leg swelling.  Gastrointestinal: Negative for nausea and vomiting.  Musculoskeletal: Positive for arthralgias and joint swelling.  All other systems reviewed and are negative.    Physical Exam Updated Vital Signs BP 131/85   Pulse 69   Temp 97.9 F (36.6 C) (Oral)   Resp 19   Ht 5\' 10"  (1.778 m)   Wt 229 lb (103.9 kg)   SpO2 100%   BMI 32.86 kg/m   Physical Exam  CONSTITUTIONAL: Well developed/well nourished HEAD: Normocephalic/atraumatic EYES: EOMI/PERRL ENMT: Mucous membranes moist NECK: supple no meningeal signs SPINE/BACK:entire spine nontender CV: S1/S2 noted, no murmurs/rubs/gallops noted LUNGS: Lungs are clear to  auscultation bilaterally, no apparent distress ABDOMEN: soft, nontender, no rebound or guarding, bowel sounds noted throughout abdomen GU:no cva tenderness NEURO: Pt is awake/alert/appropriate, moves all extremitiesx4.  No facial droop.   EXTREMITIES: pulses normal/equal, full ROM. R calf tenderness and edema. No erythema noted. Bruising noted to R ankle. R achilles is intact. No bony tenderness to R knee or ankle SKIN: warm, color normal PSYCH: no abnormalities of mood noted, alert and oriented to situation   ED Treatments / Results   DIAGNOSTIC STUDIES: Oxygen Saturation is 100% on RA, Normal by my interpretation.    COORDINATION OF CARE: 3:25 AM- Will order blood work and arrange for outpatient imaging to rule out DVT. Discussed treatment plan with pt at bedside and pt agreed to plan.     Labs (all labs ordered are listed, but only abnormal results are displayed) Labs Reviewed  CBC WITH DIFFERENTIAL/PLATELET - Abnormal; Notable for the following:       Result Value   Hemoglobin 12.9  (*)    All other components within normal limits  BASIC METABOLIC PANEL - Abnormal; Notable for the following:    Glucose, Bld 112 (*)    Creatinine, Ser 1.45 (*)    GFR calc non Af Amer 48 (*)    GFR calc Af Amer 56 (*)    All other components within normal limits  PROTIME-INR    EKG  EKG Interpretation None       Radiology No results found.  Procedures Procedures (including critical care time)  Medications Ordered in ED Medications - No data to display   Initial Impression / Assessment and Plan / ED Course  I have reviewed the triage vital signs and the nursing notes.  Pertinent labs  results that were available during my care of the patient were reviewed by me and considered in my medical decision making (see chart for details).  Clinical Course    Pt with LE edema/pain Strong suspicion for MSK injury due to hearing pop with localized edema/bruising However DVT not ruled out He will need vascular ultrasound but will defer empiric lovenox He will need to remain NWB and crutches given He will return later in AM for DVT study.   If negative plan for him to f/u ortho-spors medicine No signs of achilles injury, could be calf injury however  Final Clinical Impressions(s) / ED Diagnoses   Final diagnoses:  Peripheral edema  Right calf pain    New Prescriptions Discharge Medication List as of 11/05/2015  4:29 AM    START taking these medications   Details  HYDROcodone-acetaminophen (NORCO/VICODIN) 5-325 MG tablet Take 1 tablet by mouth every 6 (six) hours as needed for severe pain., Starting Tue 11/05/2015, Print       I personally performed the services described in this documentation, which was scribed in my presence. The recorded information has been reviewed and is accurate.       Zadie Rhineonald Trashawn Oquendo, MD 11/05/15 [phone]

## 2015-11-06 ENCOUNTER — Ambulatory Visit (HOSPITAL_COMMUNITY)
Admission: RE | Admit: 2015-11-06 | Discharge: 2015-11-06 | Disposition: A | Discharge status: Home / Self Care | Payer: Self-pay | Source: Ambulatory Visit | Attending: Emergency Medicine | Admitting: Emergency Medicine

## 2015-11-06 ENCOUNTER — Telehealth (HOSPITAL_BASED_OUTPATIENT_CLINIC_OR_DEPARTMENT_OTHER): Payer: Self-pay | Admitting: Emergency Medicine

## 2015-11-06 DIAGNOSIS — R937 Abnormal findings on diagnostic imaging of other parts of musculoskeletal system: Secondary | ICD-10-CM | POA: Insufficient documentation

## 2015-11-06 DIAGNOSIS — M79609 Pain in unspecified limb: Secondary | ICD-10-CM

## 2015-11-06 DIAGNOSIS — M7989 Other specified soft tissue disorders: Secondary | ICD-10-CM | POA: Insufficient documentation

## 2015-11-06 NOTE — Progress Notes (Signed)
VASCULAR LAB PRELIMINARY  PRELIMINARY  PRELIMINARY  PRELIMINARY  Right lower extremity venous duplex completed.    Preliminary report:  Right:  No evidence of DVT, superficial thrombosis, or Baker's cyst. Questionable possiblemuscle tear versus possible hematoma mid calf.  Makylee Sanborn, RVS 11/06/2015, 4:37 PM

## 2015-12-03 ENCOUNTER — Encounter: Payer: Self-pay | Admitting: Cardiology

## 2015-12-12 NOTE — Progress Notes (Signed)
HPI: FU CAD. The patient is status post coronary artery bypass and graft 1994 consisting of sequential LIMA to D-2 and LAD, and sequential vein graft to D-1 and ramus intermedius. He has also had PCI of his right coronary artery. Patient had PCI of his left circumflex in January 2017. Echocardiogram January 2017 showed normal LV systolic function, grade 2 diastolic dysfunction and mild biatrial enlargement. Last cardiac catheterization August 2017 showed severe three-vessel coronary artery disease, patent LIMA to the second diagonal and LAD, occluded saphenous vein graft to the first diagonal and obtuse marginal and moderate LV dysfunction. Patient had successful PCI of the left main/ostial circumflex with drug-eluting stent. Since he was last seen, the patient denies any dyspnea on exertion, orthopnea, PND, pedal edema, palpitations, syncope or chest pain.   Current Outpatient Prescriptions  Medication Sig Dispense Refill  . AMBULATORY NON FORMULARY MEDICATION Take 90 mg by mouth 2 (two) times daily. Medication Name: Jeraldine LootsBrilinta Twilight study drug provided.    . AMBULATORY NON FORMULARY MEDICATION Take 81 mg by mouth daily. Medication Name: Aspirin, TWILIGHT research study drug provided    . atorvastatin (LIPITOR) 80 MG tablet Take 1 tablet (80 mg total) by mouth daily. 30 tablet 11  . carvedilol (COREG) 3.125 MG tablet Take 1 tablet (3.125 mg total) by mouth 2 (two) times daily. 60 tablet 11  . HYDROcodone-acetaminophen (NORCO/VICODIN) 5-325 MG tablet Take 1 tablet by mouth every 6 (six) hours as needed for severe pain. 5 tablet 0  . lisinopril (PRINIVIL,ZESTRIL) 10 MG tablet Take 1 tablet (10 mg total) by mouth daily. 30 tablet 11  . nitroGLYCERIN (NITROSTAT) 0.4 MG SL tablet Place 1 tablet (0.4 mg total) under the tongue every 5 (five) minutes x 3 doses as needed for chest pain. 25 tablet 3   No current facility-administered medications for this visit.      Past Medical History:    Diagnosis Date  . Anxiety   . CAD (coronary artery disease)    a. s/p CABG 1994 with LIMA to D2 and LAD, SVG to D1 and ramus intermedius. b. acute inferior MI 2001 [facility]  b. NSTEMI 02/2014: s/p LHC with DES to oLCx c. 09/2015 PCI with DES to left main/ostial LCx  . Chronic bronchitis (HCC)    "they say I get it q yr" (10/03/2015)  . CKD (chronic kidney disease), stage II   . Depression   . ED (erectile dysfunction)   . GERD (gastroesophageal reflux disease)   . Hyperlipidemia   . Hypertension   . Ischemic cardiomyopathy    a. EF 48% by nuc in 2012.  . Myocardial infarction "several"  . Nephrolithiasis   . Obesity   . Prediabetes     Past Surgical History:  Procedure Laterality Date  . CARDIAC CATHETERIZATION N/A 02/19/2015   Procedure: Left Heart Cath and Cors/Grafts Angiography;  Surgeon: Kathleene Hazelhristopher D McAlhany, MD;  LAD & RCA 100%, LIMA-LAD 50%, SVG-OM-D1 100% stenosis, distal limb D1-OM ok, CFX 99%  . CARDIAC CATHETERIZATION  02/19/2015   Procedure: Coronary Stent Intervention;  Surgeon: Kathleene Hazelhristopher D McAlhany, MD; 3.5 x 16 mm Promus Premier DES to the ostial CFX   . CARDIAC CATHETERIZATION N/A 10/03/2015   Procedure: Left Heart Cath and Cors/Grafts Angiography;  Surgeon: Peter M SwazilandJordan, MD;  [facility];  Service: Cardiovascular;  Laterality: N/A;  . CARDIAC CATHETERIZATION N/A 10/03/2015   Procedure: Coronary Stent Intervention;  Surgeon: Peter M SwazilandJordan, MD;  Location: [facility] INVASIVE CV LAB;  Service: Cardiovascular;  Laterality: N/A;  DES- Resolute 4.0x15 to distal left main into the ostial circumflex artery  . CARDIAC CATHETERIZATION N/A 10/03/2015   Procedure: Intravascular Ultrasound/IVUS;  Surgeon: Peter M SwazilandJordan, MD;  [facility];  Service: Cardiovascular;  Laterality: N/A;  . CORONARY ANGIOPLASTY WITH STENT PLACEMENT  1994 - 2001 X 5   "stents each time"  . CORONARY ANGIOPLASTY WITH STENT PLACEMENT  10/03/2015  .  CORONARY ARTERY BYPASS GRAFT  1994   LIMA-D2-LAD, SVG-D1-OM2  . CYSTOSCOPY W/ STONE MANIPULATION      Social History   Social History  . Marital status: Divorced    Spouse name: N/A  . Number of children: 3  . Years of education: N/A   Occupational History  . Retired    Social History Main Topics  . Smoking status: Never Smoker  . Smokeless tobacco: Never Used  . Alcohol use No  . Drug use: No  . Sexual activity: Yes   Other Topics Concern  . Not on file   Social History Narrative   Twenty years ago CAD and CABG,no chest pain since per patient but was eval., In ED for chest pain 1 month prev.-MI  r/o but no stress done. Took meds x1 month after MI but caused ED so stopped them had testosterone check >199,wants cialis or testosterone replacement. "if i can't have sex,life isn't worth living".    Family History  Problem Relation Age of Onset  . Coronary artery disease Mother     Died at age 67 of MI  . Coronary artery disease Father     MI age 67    ROS: no fevers or chills, productive cough, hemoptysis, dysphasia, odynophagia, melena, hematochezia, dysuria, hematuria, rash, seizure activity, orthopnea, PND, pedal edema, claudication. Remaining systems are negative.  Physical Exam: Well-developed well-nourished in no acute distress.  Skin is warm and dry.  HEENT is normal.  Neck is supple.  Chest is clear to auscultation with normal expansion.  Cardiovascular exam is regular rate and rhythm.  Abdominal exam nontender or distended. No masses palpated. Extremities show no edema. neuro grossly intact  A/P  1 Coronary artery disease-continue aspirin and statin. Continue twilight study drug.  2 hypertension-blood pressure controlled. Continue present medications.  3 ischemic cardiomyopathy-plan repeat echocardiogram. Continue ACE inhibitor and beta blocker.  Olga MillersBrian Crenshaw MD

## 2015-12-17 ENCOUNTER — Encounter: Payer: Self-pay | Admitting: Cardiology

## 2015-12-17 ENCOUNTER — Ambulatory Visit (INDEPENDENT_AMBULATORY_CARE_PROVIDER_SITE_OTHER): Payer: Self-pay | Admitting: Cardiology

## 2015-12-17 VITALS — BP 110/60 | HR 61 | Ht 70.0 in | Wt 230.0 lb

## 2015-12-17 DIAGNOSIS — I429 Cardiomyopathy, unspecified: Secondary | ICD-10-CM

## 2015-12-17 DIAGNOSIS — I10 Essential (primary) hypertension: Secondary | ICD-10-CM

## 2015-12-17 DIAGNOSIS — I2510 Atherosclerotic heart disease of native coronary artery without angina pectoris: Secondary | ICD-10-CM

## 2015-12-17 MED ORDER — LISINOPRIL 10 MG PO TABS: 10.0000 mg | ORAL_TABLET | Freq: Every day | ORAL | 3 refills | Status: DC

## 2015-12-17 MED ORDER — ATORVASTATIN CALCIUM 80 MG PO TABS: 80.0000 mg | ORAL_TABLET | Freq: Every day | ORAL | 3 refills | Status: DC

## 2015-12-17 MED ORDER — NITROGLYCERIN 0.4 MG SL SUBL: 0.4000 mg | SUBLINGUAL_TABLET | SUBLINGUAL | 12 refills | Status: DC | PRN

## 2015-12-17 MED ORDER — CARVEDILOL 3.125 MG PO TABS: 3.1250 mg | ORAL_TABLET | Freq: Two times a day (BID) | ORAL | 4 refills | Status: DC

## 2015-12-17 NOTE — Patient Instructions (Signed)
Medication Instructions:   NO CHANGE= REFILLS SENT TO THE PHARMACY ELECTRONICALLY   Testing/Procedures:  Your physician has requested that you have an echocardiogram. Echocardiography is a painless test that uses sound waves to create images of your heart. It provides your doctor with information about the size and shape of your heart and how well your heart's chambers and valves are working. This procedure takes approximately one hour. There are no restrictions for this procedure.    Follow-Up:  Your physician wants you to follow-up in: 6 MONTHS WITH DR Jens SomRENSHAW You will receive a reminder letter in the mail two months in advance. If you don't receive a letter, please call our office to schedule the follow-up appointment.   If you need a refill on your cardiac medications before your next appointment, please call your pharmacy.

## 2016-01-08 ENCOUNTER — Ambulatory Visit (HOSPITAL_COMMUNITY): Payer: Self-pay | Attending: Cardiology

## 2016-01-08 ENCOUNTER — Other Ambulatory Visit: Payer: Self-pay

## 2016-01-08 DIAGNOSIS — I1310 Hypertensive heart and chronic kidney disease without heart failure, with stage 1 through stage 4 chronic kidney disease, or unspecified chronic kidney disease: Secondary | ICD-10-CM | POA: Insufficient documentation

## 2016-01-08 DIAGNOSIS — E669 Obesity, unspecified: Secondary | ICD-10-CM | POA: Insufficient documentation

## 2016-01-08 DIAGNOSIS — R7303 Prediabetes: Secondary | ICD-10-CM | POA: Insufficient documentation

## 2016-01-08 DIAGNOSIS — E785 Hyperlipidemia, unspecified: Secondary | ICD-10-CM | POA: Insufficient documentation

## 2016-01-08 DIAGNOSIS — I2510 Atherosclerotic heart disease of native coronary artery without angina pectoris: Secondary | ICD-10-CM | POA: Insufficient documentation

## 2016-01-08 DIAGNOSIS — N182 Chronic kidney disease, stage 2 (mild): Secondary | ICD-10-CM | POA: Insufficient documentation

## 2016-01-08 DIAGNOSIS — I429 Cardiomyopathy, unspecified: Secondary | ICD-10-CM | POA: Insufficient documentation

## 2016-01-08 DIAGNOSIS — Z6833 Body mass index (BMI) 33.0-33.9, adult: Secondary | ICD-10-CM | POA: Insufficient documentation

## 2016-01-08 DIAGNOSIS — I340 Nonrheumatic mitral (valve) insufficiency: Secondary | ICD-10-CM | POA: Insufficient documentation

## 2016-01-08 DIAGNOSIS — I252 Old myocardial infarction: Secondary | ICD-10-CM | POA: Insufficient documentation

## 2016-01-13 ENCOUNTER — Encounter: Payer: Self-pay | Admitting: *Deleted

## 2016-01-13 ENCOUNTER — Other Ambulatory Visit: Payer: Self-pay | Admitting: *Deleted

## 2016-01-13 DIAGNOSIS — Z006 Encounter for examination for normal comparison and control in clinical research program: Secondary | ICD-10-CM

## 2016-01-13 MED ORDER — AMBULATORY NON FORMULARY MEDICATION: 81.0000 mg | Freq: Every day | Status: DC

## 2016-01-13 NOTE — Progress Notes (Signed)
TWILIGHT Research study month 3 randomization visit completed. Patient denies any bleeding or other adverse events. States he has been compliant with medication, may have missed an evening dose of the Brilinta. Reinforced the importance of the Brilinta to patient. Today he has been randomized to ASA 81 mg Daily or PLACEBO. Dispensed the following pill container numbers [id number]; [id number]; [id number]; [id number]. Next required research visit is due no later than 20/JUN/2018. Questions encouraged and answered.

## 2016-01-28 ENCOUNTER — Telehealth: Payer: Self-pay | Admitting: *Deleted

## 2016-01-28 NOTE — Telephone Encounter (Signed)
Left message for patient to call the research office for month 4 telephone follow up.

## 2016-01-31 ENCOUNTER — Encounter: Payer: Self-pay | Admitting: *Deleted

## 2016-01-31 DIAGNOSIS — Z006 Encounter for examination for normal comparison and control in clinical research program: Secondary | ICD-10-CM

## 2016-01-31 NOTE — Progress Notes (Signed)
TWILIGHT Research study month 4 telephone visit completed. Patient denies any bleeding or other adverse events. He states he has been compliant with medication. Next research required visit is due no later than 20/JUN/18. Questions encouraged answered.

## 2016-02-14 ENCOUNTER — Telehealth: Payer: Self-pay | Admitting: *Deleted

## 2016-02-14 NOTE — Telephone Encounter (Signed)
Phase 2 orders faxed 

## 2016-07-24 ENCOUNTER — Encounter: Payer: Self-pay | Admitting: *Deleted

## 2016-07-24 DIAGNOSIS — Z006 Encounter for examination for normal comparison and control in clinical research program: Secondary | ICD-10-CM

## 2016-07-24 NOTE — Progress Notes (Signed)
TWILIGHT Research study month 9 follow up visit completed. Patient states he has had 4 nose bleeds since last visit lasting approximately 2 minutes each. (Dates of nose bleed 07/14/16; 06/20/16;05/26/16;04/15/16). Patient has been 32.81% compliant with ASA/Placebo and 85.68% compliant with Brilinta. Spoke with patient in detail about the importance of complaince with medication.His next visit is due no later than 17/DEC/2018. At that appointment he will no longer receive study provided Brilinta ASA/Placebo and further antiplatelet therapy will be at the discretion of his cardiologist. Questions were encouraged and answered.

## 2016-09-01 DIAGNOSIS — L814 Other melanin hyperpigmentation: Secondary | ICD-10-CM | POA: Diagnosis not present

## 2016-09-01 DIAGNOSIS — L720 Epidermal cyst: Secondary | ICD-10-CM | POA: Diagnosis not present

## 2016-09-09 DIAGNOSIS — R208 Other disturbances of skin sensation: Secondary | ICD-10-CM | POA: Diagnosis not present

## 2016-09-09 DIAGNOSIS — L723 Sebaceous cyst: Secondary | ICD-10-CM | POA: Diagnosis not present

## 2017-01-05 ENCOUNTER — Other Ambulatory Visit: Payer: Self-pay | Admitting: *Deleted

## 2017-01-05 ENCOUNTER — Encounter: Payer: Self-pay | Admitting: *Deleted

## 2017-01-05 DIAGNOSIS — Z006 Encounter for examination for normal comparison and control in clinical research program: Secondary | ICD-10-CM

## 2017-01-05 NOTE — Progress Notes (Signed)
TWILIGHT Research study month 15 follow up visit completed.Patient states he has been compliant with both Brilinta and ASA/Placebo. He denies any bleeding or other adverse events. He did not bring his study provided medication to check for compliance. He was instructed to return or discard remaining study provided medication and to start ASA 81 mg daily today. Patient verbalizes understanding. He has 1 remaining follow up phone call due between 03/12/17-04/09/17. I thanked him for his participation in the research study

## 2017-02-16 MED ORDER — ASPIRIN EC 81 MG PO TBEC: 81.0000 mg | DELAYED_RELEASE_TABLET | Freq: Every day | ORAL | 3 refills | Status: DC

## 2017-02-16 NOTE — Addendum Note (Signed)
Addended by: Orrin BrighamHEDRICK, Chena Chohan W on: 02/16/2017 11:04 AM   Modules accepted: Orders

## 2017-03-26 ENCOUNTER — Encounter: Payer: Self-pay | Admitting: *Deleted

## 2017-03-26 DIAGNOSIS — Z006 Encounter for examination for normal comparison and control in clinical research program: Secondary | ICD-10-CM

## 2017-03-26 NOTE — Progress Notes (Signed)
TWILIGHT research study month 18 telephone follow up completed. Patient states he is still on ASA and doing well. He denies any adverse events. I thanked him for his participation.

## 2018-02-14 DIAGNOSIS — R300 Dysuria: Secondary | ICD-10-CM | POA: Diagnosis not present

## 2018-02-14 DIAGNOSIS — R369 Urethral discharge, unspecified: Secondary | ICD-10-CM | POA: Diagnosis not present

## 2019-08-15 DIAGNOSIS — I4892 Unspecified atrial flutter: Secondary | ICD-10-CM | POA: Insufficient documentation

## 2019-09-07 DIAGNOSIS — I340 Nonrheumatic mitral (valve) insufficiency: Secondary | ICD-10-CM | POA: Insufficient documentation

## 2019-09-07 DIAGNOSIS — I351 Nonrheumatic aortic (valve) insufficiency: Secondary | ICD-10-CM | POA: Insufficient documentation

## 2019-09-07 DIAGNOSIS — Z7901 Long term (current) use of anticoagulants: Secondary | ICD-10-CM | POA: Insufficient documentation

## 2019-09-07 DIAGNOSIS — I5023 Acute on chronic systolic (congestive) heart failure: Secondary | ICD-10-CM | POA: Insufficient documentation

## 2019-12-18 DIAGNOSIS — N401 Enlarged prostate with lower urinary tract symptoms: Secondary | ICD-10-CM | POA: Insufficient documentation

## 2019-12-18 DIAGNOSIS — N138 Other obstructive and reflux uropathy: Secondary | ICD-10-CM | POA: Insufficient documentation

## 2020-04-15 ENCOUNTER — Other Ambulatory Visit: Payer: Self-pay | Admitting: Registered Nurse

## 2020-04-15 DIAGNOSIS — R0989 Other specified symptoms and signs involving the circulatory and respiratory systems: Secondary | ICD-10-CM

## 2020-04-18 ENCOUNTER — Ambulatory Visit
Admission: RE | Admit: 2020-04-18 | Discharge: 2020-04-18 | Disposition: A | Discharge status: Home / Self Care | Payer: Self-pay | Source: Ambulatory Visit | Attending: Registered Nurse | Admitting: Registered Nurse

## 2020-04-18 DIAGNOSIS — R0989 Other specified symptoms and signs involving the circulatory and respiratory systems: Secondary | ICD-10-CM

## 2020-07-30 ENCOUNTER — Ambulatory Visit: Payer: MEDICAID | Admitting: Podiatry

## 2020-08-05 ENCOUNTER — Ambulatory Visit: Payer: Medicare Other | Admitting: Podiatry

## 2020-08-14 ENCOUNTER — Other Ambulatory Visit: Payer: Self-pay | Admitting: *Deleted

## 2020-08-14 DIAGNOSIS — M25569 Pain in unspecified knee: Secondary | ICD-10-CM

## 2020-08-22 ENCOUNTER — Other Ambulatory Visit: Payer: Self-pay

## 2020-08-22 ENCOUNTER — Ambulatory Visit (HOSPITAL_COMMUNITY)
Admission: EM | Admit: 2020-08-22 | Discharge: 2020-08-22 | Disposition: A | Discharge status: Home / Self Care | Payer: Medicare Other | Attending: Emergency Medicine | Admitting: Emergency Medicine

## 2020-08-22 ENCOUNTER — Telehealth: Payer: Self-pay | Admitting: Student

## 2020-08-22 ENCOUNTER — Encounter (HOSPITAL_COMMUNITY): Payer: Self-pay

## 2020-08-22 DIAGNOSIS — N472 Paraphimosis: Secondary | ICD-10-CM

## 2020-08-22 LAB — POCT URINALYSIS DIPSTICK, ED / UC
Bilirubin Urine: NEGATIVE
Glucose, UA: NEGATIVE mg/dL
Ketones, ur: NEGATIVE mg/dL
Leukocytes,Ua: NEGATIVE
Nitrite: NEGATIVE
Protein, ur: NEGATIVE mg/dL
Specific Gravity, Urine: 1.015 (ref 1.005–1.030)
Urobilinogen, UA: 0.2 mg/dL (ref 0.0–1.0)
pH: 5.5 (ref 5.0–8.0)

## 2020-08-22 MED ORDER — HYDROCODONE-ACETAMINOPHEN 5-325 MG PO TABS
1.0000 | ORAL_TABLET | Freq: Once | ORAL | Status: AC
  Administered 2020-08-22: 1 via ORAL

## 2020-08-22 MED ORDER — CEPHALEXIN 500 MG PO CAPS: 500.0000 mg | ORAL_CAPSULE | Freq: Four times a day (QID) | ORAL | 0 refills | Status: DC

## 2020-08-22 MED ORDER — HYDROCODONE-ACETAMINOPHEN 5-325 MG PO TABS
ORAL_TABLET | ORAL | Status: AC
  Filled 2020-08-22: qty 1

## 2020-08-22 NOTE — ED Provider Notes (Signed)
HPI  SUBJECTIVE:  William Matthews is an uncircumcised 70 y.o. male who presents with 1 week of inability to retract his foreskin, constant penile pain, swelling, odor, purplish red discoloration.  He states that he is getting slightly better.  he injects his penis weekly for erectile dysfunction, and the needle was too short, performing a subcutaneous injection.  He reports difficulty with urination.  States that he has been unable to completely empty his bladder since. No Nausea, vomiting, fevers, abdominal, pelvic, back pain testicular pain, scrotal swelling.  He reports urinary urgency, frequency, no cloudy or odorous urine, hematuria.  Patient saw his PMD 3 days ago, and was advised to go to the ED.  He did not due to the crowding.  He tried an unknown cream with some improvement in his symptoms.  Symptoms are worse with wearing clothing, palpation.  He has a past medical history of diabetes, hypertension, erectile dysfunction, MI status post CABG and stents, chronic kidney disease stage II.  PMD: [employer]  Urology: In [facility].    Past Medical History:  Diagnosis Date   Anxiety    CAD (coronary artery disease)    a. s/p CABG 1994 with LIMA to D2 and LAD, SVG to D1 and ramus intermedius. b. acute inferior MI 2001 s/p rescue PTCA/stent to [facility].  b. NSTEMI 02/2014: s/p LHC with DES to oLCx c. 09/2015 PCI with DES to left main/ostial LCx   Chronic bronchitis (HCC)    "they say I get it q yr" (10/03/2015)   CKD (chronic kidney disease), stage II    Depression    ED (erectile dysfunction)    GERD (gastroesophageal reflux disease)    Hyperlipidemia    Hypertension    Ischemic cardiomyopathy    a. EF 48% by nuc in 2012.   Myocardial infarction [facility]) "several"   Nephrolithiasis    Obesity    Prediabetes     Past Surgical History:  Procedure Laterality Date   CARDIAC CATHETERIZATION N/A 02/19/2015   Procedure: Left Heart Cath and Cors/Grafts Angiography;  Surgeon: Kathleene Hazel, MD;  LAD & RCA 100%, LIMA-LAD 50%, SVG-OM-D1 100% stenosis, distal limb D1-OM ok, CFX 99%   CARDIAC CATHETERIZATION  02/19/2015   Procedure: Coronary Stent Intervention;  Surgeon: Kathleene Hazel, MD; 3.5 x 16 mm Promus Premier DES to the ostial CFX    CARDIAC CATHETERIZATION N/A 10/03/2015   Procedure: Left Heart Cath and Cors/Grafts Angiography;  Surgeon: Peter M Swaziland, MD;  Location: [facility] INVASIVE CV LAB;  Service: Cardiovascular;  Laterality: N/A;   CARDIAC CATHETERIZATION N/A 10/03/2015   Procedure: Coronary Stent Intervention;  Surgeon: Peter M Swaziland, MD;  Location: [facility] INVASIVE CV LAB;  Service: Cardiovascular;  Laterality: N/A;  DES- Resolute 4.0x15 to distal left main into the ostial circumflex artery   CARDIAC CATHETERIZATION N/A 10/03/2015   Procedure: Intravascular Ultrasound/IVUS;  Surgeon: Peter M Swaziland, MD;  Location: [facility] INVASIVE CV LAB;  Service: Cardiovascular;  Laterality: N/A;   CORONARY ANGIOPLASTY WITH STENT PLACEMENT  1994 - 2001 X 5   "stents each time"   CORONARY ANGIOPLASTY WITH STENT PLACEMENT  10/03/2015   CORONARY ARTERY BYPASS GRAFT  1994   LIMA-D2-LAD, SVG-D1-OM2   CYSTOSCOPY W/ STONE MANIPULATION      Family History  Problem Relation Age of Onset   Coronary artery disease Mother        Died at age 34 of MI   Coronary artery disease Father  MI age 27    Social History   Tobacco Use   Smoking status: Never   Smokeless tobacco: Never  Substance Use Topics   Alcohol use: No   Drug use: No    No current facility-administered medications for this encounter.  Current Outpatient Medications:    aspirin EC 81 MG tablet, Take 1 tablet (81 mg total) by mouth daily., Disp: 90 tablet, Rfl: 3   atorvastatin (LIPITOR) 80 MG tablet, Take 1 tablet (80 mg total) by mouth daily., Disp: 90 tablet, Rfl: 3   carvedilol (COREG) 3.125 MG tablet, Take 1 tablet (3.125 mg total) by mouth 2 (two) times daily., Disp: 180 tablet, Rfl: 4    HYDROcodone-acetaminophen (NORCO/VICODIN) 5-325 MG tablet, Take 1 tablet by mouth every 6 (six) hours as needed for severe pain., Disp: 5 tablet, Rfl: 0   lisinopril (PRINIVIL,ZESTRIL) 10 MG tablet, Take 1 tablet (10 mg total) by mouth daily., Disp: 90 tablet, Rfl: 3   nitroGLYCERIN (NITROSTAT) 0.4 MG SL tablet, Place 1 tablet (0.4 mg total) under the tongue every 5 (five) minutes x 3 doses as needed for chest pain., Disp: 25 tablet, Rfl: 12  Allergies  Allergen Reactions   Ciprofloxacin Hcl     Unknown    Peanut-Containing Drug Products     Unknown      ROS  As noted in HPI.   Physical Exam  BP (!) 164/94 (BP Location: Right Arm)   Pulse 66   Temp 98.3 F (36.8 C) (Oral)   Resp 18   SpO2 99%   Constitutional: Well developed, well nourished, no acute distress Eyes:  EOMI, conjunctiva normal bilaterally HENT: Normocephalic, atraumatic,mucus membranes moist Respiratory: Normal inspiratory effort Cardiovascular: Normal rate GI: nondistended skin: No rash, skin intact GU: Swollen foreskin with yellowish patches, positive odor.  Patient declined chaperone.             Musculoskeletal: no deformities Neurologic: Alert & oriented x 3, no focal neuro deficits Psychiatric: Speech and behavior appropriate   ED Course   Medications  HYDROcodone-acetaminophen (NORCO/VICODIN) 5-325 MG per tablet 1 tablet (1 tablet Oral Given 08/22/20 1752)    Orders Placed This Encounter  Procedures   POC Urinalysis dipstick    Standing Status:   Standing    Number of Occurrences:   1    Results for orders placed or performed during the hospital encounter of 08/22/20 (from the past 24 hour(s))  POC Urinalysis dipstick     Status: Abnormal   Collection Time: 08/22/20  5:52 PM  Result Value Ref Range   Glucose, UA NEGATIVE NEGATIVE mg/dL   Bilirubin Urine NEGATIVE NEGATIVE   Ketones, ur NEGATIVE NEGATIVE mg/dL   Specific Gravity, Urine 1.015 1.005 - 1.030   Hgb urine dipstick  TRACE (A) NEGATIVE   pH 5.5 5.0 - 8.0   Protein, ur NEGATIVE NEGATIVE mg/dL   Urobilinogen, UA 0.2 0.0 - 1.0 mg/dL   Nitrite NEGATIVE NEGATIVE   Leukocytes,Ua NEGATIVE NEGATIVE   No results found.  ED Clinical Impression  1. Paraphimosis      ED Assessment/Plan  Concern for infected paraphimosis.  Discussed with Dr. Corky Sox, urology on-call.  He advises UA and attempt to reduce it here.  If unable to reduce it, will send to the emergency department at [facility].  Will premedicate with 1 Norco.  Patient did not drive here.  No UTI.  Procedure note: Cleaned penis, applied sugar per recommendations. Attempted to manually reduce multiple times without success.  Patient tolerated procedure well.  Discussed with urology.  They will see him at the [facility] long ED. Will also send home with Keflex for 10 days for secondary infection as patient states that he does not want to go to the emergency department.  Discussed with him that this will not get better on its own, that antibiotics will not help with the swelling, and for the chance of urinary retention.  He may follow-up with his urologist if he chooses to not go to the ED tonight.  Discussed labs, MDM, treatment plan, and plan for follow-up with patient.  patient agrees with plan.   Meds ordered this encounter  Medications   HYDROcodone-acetaminophen (NORCO/VICODIN) 5-325 MG per tablet 1 tablet      *This clinic note was created using Scientist, clinical (histocompatibility and immunogenetics). Therefore, there may be occasional mistakes despite careful proofreading.  ?    Domenick Gong, MD 08/22/20 [phone]

## 2020-08-22 NOTE — Telephone Encounter (Signed)
I called and spoke to Dr. Terrilee Croak regarding this 70 year old patient with a history of diabetes and erectile dysfunction who performs intracavernosal injections at home.  He has a paraphimosis.  His foreskin has been retracted for 1 week and he is unable to get it down.  He is parenting significant discomfort and some trouble urinating.  There are pictures in the chart under the media tab.  I explained different strategies to try to reduce the paraphimosis including wrapping it tightly and Kerlix and applying manual pressure to reduce the edema and answer reduce the foreskin manually, applying sugar or salt to the foreskin for an osmotic gradient to decrease the edema followed by manual pressure and reduction of the foreskin, numbing the patient with local anesthetic and attempting to manually reduce the foreskin.  There is an area of some ischemic skin change dorsally likely from the pressure from the prolonged edema.  I believe as long as the patient is afebrile, able to pee, as long as the foreskin is reduced, he should be safe for discharge.  I would send a urinalysis and if grossly infected will consider treating for UTI empirically.  Patient should also follow-up with urology upon discharge.  If foreskin is unable to be reduced using the aforementioned methods, patient may have to come to the [facility] emergency department for urologic evaluation and reduction.  Cherlyn Labella, MD [facility] PGY4 [facility]

## 2020-08-22 NOTE — ED Triage Notes (Signed)
Pt presents with penis swelling and pain X 1 week; pt states he administers shot to his self weekly for erection and the shot that he administered at the beginning of the week the needle was shorter than usual and he does not believe it was injected where it was supposed to be.

## 2020-08-22 NOTE — Discharge Instructions (Addendum)
This needs to be reduced.  It will just get more swollen, more uncomfortable and more difficult to reduce.  Please go to the [facility] emergency department.  The urologist is expecting you.  They will take care of this.  If you decide to not go to the emergency department, the Keflex will help with an infection but will not help with swelling.  follow-up with your urologist as soon as possible.

## 2020-08-23 ENCOUNTER — Ambulatory Visit: Payer: Medicare Other | Admitting: Podiatry

## 2020-08-23 ENCOUNTER — Encounter: Payer: Self-pay | Admitting: Podiatry

## 2020-08-23 DIAGNOSIS — M79674 Pain in right toe(s): Secondary | ICD-10-CM | POA: Insufficient documentation

## 2020-08-23 DIAGNOSIS — N182 Chronic kidney disease, stage 2 (mild): Secondary | ICD-10-CM | POA: Diagnosis not present

## 2020-08-23 DIAGNOSIS — B351 Tinea unguium: Secondary | ICD-10-CM | POA: Diagnosis not present

## 2020-08-23 DIAGNOSIS — Z7901 Long term (current) use of anticoagulants: Secondary | ICD-10-CM | POA: Diagnosis not present

## 2020-08-23 DIAGNOSIS — M79675 Pain in left toe(s): Secondary | ICD-10-CM

## 2020-08-23 NOTE — Progress Notes (Signed)
This patient returns to my office for at risk foot care.  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  Patient is taking eliquis.  This patient is unable to cut nails himself since the patient cannot reach his nails.These nails are painful walking and wearing shoes.  This patient presents for at risk foot care today.  General Appearance  Alert, conversant and in no acute stress.  Vascular  Dorsalis pedis and posterior tibial  pulses are palpable  bilaterally.  Capillary return is within normal limits  bilaterally. Temperature is within normal limits  bilaterally.  Neurologic  Senn-Weinstein monofilament wire test within normal limits  bilaterally. Muscle power within normal limits bilaterally.  Nails Thick disfigured discolored nails with subungual debris  from hallux to fifth toes bilaterally. No evidence of bacterial infection or drainage bilaterally.  Orthopedic  No limitations of motion  feet .  No crepitus or effusions noted.  No bony pathology or digital deformities noted.  HAV  B/L.  Skin  normotropic skin with no porokeratosis noted bilaterally.  No signs of infections or ulcers noted.     Onychomycosis  Pain in right toes  Pain in left toes  Consent was obtained for treatment procedures.   Mechanical debridement of nails 1-5  bilaterally performed with a nail nipper.  Filed with dremel without incident.    Return office visit     3 months                 Told patient to return for periodic foot care and evaluation due to potential at risk complications.   Helane Gunther DPM

## 2020-09-02 ENCOUNTER — Ambulatory Visit (HOSPITAL_COMMUNITY): Payer: Medicare Other

## 2020-09-04 ENCOUNTER — Ambulatory Visit (INDEPENDENT_AMBULATORY_CARE_PROVIDER_SITE_OTHER): Payer: Medicare Other | Admitting: Family Medicine

## 2020-09-18 ENCOUNTER — Ambulatory Visit (INDEPENDENT_AMBULATORY_CARE_PROVIDER_SITE_OTHER): Payer: MEDICAID | Admitting: Family Medicine

## 2020-09-30 ENCOUNTER — Ambulatory Visit (INDEPENDENT_AMBULATORY_CARE_PROVIDER_SITE_OTHER): Payer: Medicare Other | Admitting: Family Medicine

## 2020-10-10 ENCOUNTER — Ambulatory Visit (HOSPITAL_COMMUNITY)
Admission: RE | Admit: 2020-10-10 | Discharge: 2020-10-10 | Disposition: A | Discharge status: Home / Self Care | Payer: Medicare Other | Source: Ambulatory Visit | Attending: Vascular Surgery | Admitting: Vascular Surgery

## 2020-10-10 ENCOUNTER — Other Ambulatory Visit: Payer: Self-pay | Admitting: Urology

## 2020-10-10 ENCOUNTER — Ambulatory Visit: Payer: Medicare Other | Admitting: Physician Assistant

## 2020-10-10 ENCOUNTER — Other Ambulatory Visit: Payer: Self-pay

## 2020-10-10 VITALS — BP 125/85 | HR 59 | Temp 98.0°F | Resp 20 | Ht 70.0 in | Wt 267.0 lb

## 2020-10-10 DIAGNOSIS — I872 Venous insufficiency (chronic) (peripheral): Secondary | ICD-10-CM

## 2020-10-10 DIAGNOSIS — M25562 Pain in left knee: Secondary | ICD-10-CM

## 2020-10-10 DIAGNOSIS — M25569 Pain in unspecified knee: Secondary | ICD-10-CM | POA: Diagnosis not present

## 2020-10-10 NOTE — Progress Notes (Signed)
[facility] [place]   Reason for referral: Swollen B leg   History of Present Illness  William Matthews is a 70 y.o. male who presents with chief complaint: swollen leg.  Patient notes, onset of swelling 2 months ago, associated with prolonged sitting and standing.  He states he had swelling in both legs  for about 4 weeks and his primary placed him on a water pill.  The swelling has gone away now.  He has CKD, Atrial Flutter managed with Eliquis.   The patient has had no history of DVT, no history of varicose vein, no history of venous stasis ulcers, no history of  Lymphedema and no history of skin changes in lower legs.  There is no family history of venous disorders.  The patient has used compression stockings in the past.  Past Medical History:  Diagnosis Date   Anxiety    CAD (coronary artery disease)    a. s/p CABG 1994 with LIMA to D2 and LAD, SVG to D1 and ramus intermedius. b. acute inferior MI 2001 [facility]  b. NSTEMI 02/2014: s/p LHC with DES to oLCx c. 09/2015 PCI with DES to left main/ostial LCx   Chronic bronchitis (HCC)    "they say I get it q yr" (10/03/2015)   CKD (chronic kidney disease), stage II    Depression    ED (erectile dysfunction)    GERD (gastroesophageal reflux disease)    Hyperlipidemia    Hypertension    Ischemic cardiomyopathy    a. EF 48% by nuc in 2012.   Myocardial infarction [facility](HCC) "several"   Nephrolithiasis    Obesity    Prediabetes     Past Surgical History:  Procedure Laterality Date   CARDIAC CATHETERIZATION N/A 02/19/2015   Procedure: Left Heart Cath and Cors/Grafts Angiography;  Surgeon: Kathleene Hazelhristopher D McAlhany, MD;  LAD & RCA 100%, LIMA-LAD 50%, SVG-OM-D1 100% stenosis, distal limb D1-OM ok, CFX 99%   CARDIAC CATHETERIZATION  02/19/2015   Procedure: Coronary Stent Intervention;  Surgeon: Kathleene Hazelhristopher D McAlhany, MD; 3.5 x 16 mm Promus Premier DES to the ostial CFX    CARDIAC CATHETERIZATION N/A  10/03/2015   Procedure: Left Heart Cath and Cors/Grafts Angiography;  Surgeon: Peter M SwazilandJordan, MD;  [facility];  Service: Cardiovascular;  Laterality: N/A;   CARDIAC CATHETERIZATION N/A 10/03/2015   Procedure: Coronary Stent Intervention;  Surgeon: Peter M SwazilandJordan, MD;  [facility];  Service: Cardiovascular;  Laterality: N/A;  DES- Resolute 4.0x15 to distal left main into the ostial circumflex artery   CARDIAC CATHETERIZATION N/A 10/03/2015   Procedure: Intravascular Ultrasound/IVUS;  Surgeon: Peter M SwazilandJordan, MD;  [facility];  Service: Cardiovascular;  Laterality: N/A;   CORONARY ANGIOPLASTY WITH STENT PLACEMENT  1994 - 2001 X 5   "stents each time"   CORONARY ANGIOPLASTY WITH STENT PLACEMENT  10/03/2015   CORONARY ARTERY BYPASS GRAFT  1994   LIMA-D2-LAD, SVG-D1-OM2   CYSTOSCOPY W/ STONE MANIPULATION      Social History   Socioeconomic History   Marital status: Divorced    Spouse name: Not on file   Number of children: 3   Years of education: Not on file   Highest education level: Not on file  Occupational History   Occupation: Retired  Tobacco Use   Smoking status: Never   Smokeless tobacco: Never  Substance and Sexual Activity   Alcohol use: No   Drug use: No  Sexual activity: Yes  Other Topics Concern   Not on file  Social History Narrative   Twenty years ago CAD and CABG,no chest pain since per patient but was eval., In ED for chest pain 1 month prev.-MI  r/o but no stress done. Took meds x1 month after MI but caused ED so stopped them had testosterone check >199,wants cialis or testosterone replacement. "if i can't have sex,life isn't worth living".   Social Determinants of Health   Financial Resource Strain: Not on file  Food Insecurity: Not on file  Transportation Needs: Not on file  Physical Activity: Not on file  Stress: Not on file  Social Connections: Not on file  Intimate Partner Violence: Not on file     Family History  Problem Relation Age of Onset   Coronary artery disease Mother        Died at age 68 of MI   Coronary artery disease Father        MI age 83    Current Outpatient Medications on File Prior to Visit  Medication Sig Dispense Refill   amiodarone (PACERONE) 200 MG tablet Take by mouth.     apixaban (ELIQUIS) 2.5 MG TABS tablet Take by mouth.     aspirin EC 81 MG tablet Take 1 tablet (81 mg total) by mouth daily. 90 tablet 3   atorvastatin (LIPITOR) 80 MG tablet Take 1 tablet (80 mg total) by mouth daily. 90 tablet 3   esomeprazole (NEXIUM) 40 MG capsule Take 40 mg by mouth daily.     EUTHYROX 25 MCG tablet Take 25 mcg by mouth every morning.     finasteride (PROSCAR) 5 MG tablet Take 5 mg by mouth daily.     furosemide (LASIX) 40 MG tablet Take by mouth.     gabapentin (NEURONTIN) 100 MG capsule Take 100 mg by mouth daily.     levocetirizine (XYZAL) 5 MG tablet Take 5 mg by mouth daily.     losartan (COZAAR) 25 MG tablet Take 25 mg by mouth daily.     metFORMIN (GLUCOPHAGE) 500 MG tablet Take 500 mg by mouth every morning.     nitroGLYCERIN (NITROSTAT) 0.4 MG SL tablet Place 1 tablet (0.4 mg total) under the tongue every 5 (five) minutes x 3 doses as needed for chest pain. 25 tablet 12   silodosin (RAPAFLO) 8 MG CAPS capsule Take 8 mg by mouth daily.     tolterodine (DETROL LA) 4 MG 24 hr capsule Take 4 mg by mouth daily.     atorvastatin (LIPITOR) 10 MG tablet Take 10 mg by mouth at bedtime. (Patient not taking: Reported on 10/10/2020)     carvedilol (COREG) 3.125 MG tablet Take 1 tablet (3.125 mg total) by mouth 2 (two) times daily. 180 tablet 4   No current facility-administered medications on file prior to visit.    Allergies as of 10/10/2020 - Review Complete 10/10/2020  Allergen Reaction Noted   Ciprofloxacin hcl  07/02/2010   Peanut-containing drug products  05/07/2011     ROS:   General:  No weight loss, Fever, chills  HEENT: No recent headaches, no  nasal bleeding, no visual changes, no sore throat  Neurologic: No dizziness, blackouts, seizures. No recent symptoms of stroke or mini- stroke. No recent episodes of slurred speech, or temporary blindness.  Cardiac: No recent episodes of chest pain/pressure, no shortness of breath at rest.  positive shortness of breath with exertion.  Denies history of atrial fibrillation or irregular heartbeat  Vascular: No history of rest pain in feet.  No history of claudication.  No history of non-healing ulcer, No history of DVT   Pulmonary: No home oxygen, no productive cough, no hemoptysis,  No asthma or wheezing  Musculoskeletal:  [x ] Arthritis, [ ]  Low back pain,  [ ]  Joint pain  Hematologic:No history of hypercoagulable state.  No history of easy bleeding.  No history of anemia  Gastrointestinal: No hematochezia or melena,  No gastroesophageal reflux, no trouble swallowing  Urinary: [ x] chronic Kidney disease, [ ]  on HD - [ ]  MWF or [ ]  TTHS, [ ]  Burning with urination, [ ]  Frequent urination, [ ]  Difficulty urinating;   Skin: No rashes  Psychological: No history of anxiety,  No history of depression  Physical Examination  Vitals:   10/10/20 1353  BP: 125/85  Pulse: (!) 59  Resp: 20  Temp: 98 F (36.7 C)  SpO2: 96%  Weight: 267 lb (121.1 kg)  Height: 5\' 10"  (1.778 m)    Body mass index is 38.31 kg/m.  General:  Alert and oriented, no acute distress HEENT: Normal Neck: No bruit or JVD Pulmonary: Clear to auscultation bilaterally Cardiac: Regular Rate and Rhythm without murmur Abdomen: Soft, non-tender, non-distended, no mass, no scars Skin: No rash Extremity Pulses:  2+ radial, brachial, femoral, dorsalis pedis,  pulses bilaterally Musculoskeletal: No deformity or edema  Neurologic: Upper and lower extremity motor 5/5 and symmetric  DATA:    +--------------+---------+------+-----------+------------+--------+  LEFT          Reflux NoRefluxReflux TimeDiameter  cmsComments                          Yes                                   +--------------+---------+------+-----------+------------+--------+  CFV                     yes   >1 second                       +--------------+---------+------+-----------+------------+--------+  FV mid        no                                              +--------------+---------+------+-----------+------------+--------+  Popliteal     no                                              +--------------+---------+------+-----------+------------+--------+  GSV at SFJ              yes    >500 ms      0.53              +--------------+---------+------+-----------+------------+--------+  GSV prox thigh          yes    >500 ms      0.53              +--------------+---------+------+-----------+------------+--------+  GSV mid thigh           yes    >500 ms      0.39              +--------------+---------+------+-----------+------------+--------+  GSV dist thighno                            0.32              +--------------+---------+------+-----------+------------+--------+  GSV at knee   no                            0.3               +--------------+---------+------+-----------+------------+--------+  GSV prox calf no                            0.28              +--------------+---------+------+-----------+------------+--------+  SSV Pop Fossa no                            0.34              +--------------+---------+------+-----------+------------+--------+  SSV prox calf no                            0.33              +--------------+---------+------+-----------+------------+--------+  SSV mid calf  no                            0.29              +--------------+---------+------+-----------+------------+--------+      Summary:  Left:  - No evidence of deep vein thrombosis seen in the left lower extremity,  from the  common femoral through the popliteal veins.  - No evidence of superficial venous thrombosis in the left lower  extremity.  - Venous reflux is noted in the left common femoral vein.  - Venous reflux is noted in the left sapheno-femoral junction.  - Venous reflux is noted in the left greater saphenous vein in the thigh.     Assessment/Plan: Venous reflux Left LE duplex shows SFJ reflux and proximal thigh reflux without expansion of the GSV size.  He has minimal reflux without chronic changes.  He states he is able to ambulate for 1/2 and that he gets short of breath.  He states he is pre-DM and has symptoms of peripheral neuropathy.   He does have palpable pedal pulses and is not at risk of limb loss.  He will try and start a walking program, elevate his legs and wear his compression daily.  F/U PRN if he has problems in the future.    Mosetta Pigeon PA-C [facility] [place] Office: [phone]  MD in clinic [place]

## 2020-10-15 NOTE — Progress Notes (Signed)
DUE TO COVID-19 ONLY ONE VISITOR IS ALLOWED TO COME WITH YOU AND STAY IN THE WAITING ROOM ONLY DURING PRE OP AND PROCEDURE DAY OF SURGERY. THE 1 VISITOR  MAY VISIT WITH YOU AFTER SURGERY IN YOUR PRIVATE ROOM DURING VISITING HOURS ONLY!  YOU NEED TO HAVE A COVID 19 TEST ON_______ @_______ , THIS TEST MUST BE DONE BEFORE SURGERY,  COVID TESTING SITE IS AT [address] [place]. PLEASE REMAIN IN YOUR CAR THIS IS A DRIVER UP TEST. AFTER YOUR COVID TEST PLEASE WEAR A MASK OUT IN PUBLIC AND SOCIAL DISTANCE AND WASH YOUR HANDS FREQUENTLY. PLEASE ASK ALL YOUR CLOSE CONTACTS TO WEAR A MASK OUT IN PUBLIC AND SOCIAL DISTANCE AND WASH HANDS FREQUENTLY ALSO.               William Matthews  10/15/2020   Your procedure is scheduled on:  10/18/2020   Report to [facility] Main  Entrance   Report to admitting at 115 pm   Call this number if you have problems the morning of surgery [phone]    Remember: Do not eat food , candy gum or mints :After Midnight. You may have clear liquids from midnight until __ 1230 pm   CLEAR LIQUID DIET   Foods Allowed                                                                       Coffee and tea, regular and decaf                              Plain Jell-O any favor except red or purple                                            Fruit ices (not with fruit pulp)                                      Iced Popsicles                                     Carbonated beverages, regular and diet                                    Cranberry, grape and apple juices Sports drinks like Gatorade Lightly seasoned clear broth or consume(fat free) Sugar   _____________________________________________________________________    BRUSH YOUR TEETH MORNING OF SURGERY AND RINSE YOUR MOUTH OUT, NO CHEWING GUM CANDY OR MINTS.     Take these medicines the morning of surgery with A SIP OF WATER:  detrol, pacerone, coreg, nexium, ehthyrox, proscar, gabapentin, xyzal,  rapaflo   DO NOT TAKE ANY DIABETIC MEDICATIONS DAY OF YOUR SURGERY  You may not have any metal on your body including hair pins and              piercings  Do not wear jewelry, make-up, lotions, powders or perfumes, deodorant             Do not wear nail polish on your fingernails.  Do not shave  48 hours prior to surgery.              Men may shave face and neck.   Do not bring valuables to the hospital. [place].  Contacts, dentures or bridgework may not be worn into surgery.  Leave suitcase in the car. After surgery it may be brought to your room.     Patients discharged the day of surgery will not be allowed to drive home. IF YOU ARE HAVING SURGERY AND GOING HOME THE SAME DAY, YOU MUST HAVE AN ADULT TO DRIVE YOU HOME AND BE WITH YOU FOR 24 HOURS. YOU MAY GO HOME BY TAXI OR UBER OR ORTHERWISE, BUT AN ADULT MUST ACCOMPANY YOU HOME AND STAY WITH YOU FOR 24 HOURS.  Name and phone number of your driver:  Special Instructions: N/A              Please read over the following fact sheets you were given: _____________________________________________________________________  [facility] - Preparing for Surgery Before surgery, you can play an important role.  Because skin is not sterile, your skin needs to be as free of germs as possible.  You can reduce the number of germs on your skin by washing with CHG (chlorahexidine gluconate) soap before surgery.  CHG is an antiseptic cleaner which kills germs and bonds with the skin to continue killing germs even after washing. Please DO NOT use if you have an allergy to CHG or antibacterial soaps.  If your skin becomes reddened/irritated stop using the CHG and inform your nurse when you arrive at Short Stay. Do not shave (including legs and underarms) for at least 48 hours prior to the first CHG shower.  You may shave your face/neck. Please follow these instructions  carefully:  1.  Shower with CHG Soap the night before surgery and the  morning of Surgery.  2.  If you choose to wash your hair, wash your hair first as usual with your  normal  shampoo.  3.  After you shampoo, rinse your hair and body thoroughly to remove the  shampoo.                           4.  Use CHG as you would any other liquid soap.  You can apply chg directly  to the skin and wash                       Gently with a scrungie or clean washcloth.  5.  Apply the CHG Soap to your body ONLY FROM THE NECK DOWN.   Do not use on face/ open                           Wound or open sores. Avoid contact with eyes, ears mouth and genitals (private parts).  Wash face,  Genitals (private parts) with your normal soap.             6.  Wash thoroughly, paying special attention to the area where your surgery  will be performed.  7.  Thoroughly rinse your body with warm water from the neck down.  8.  DO NOT shower/wash with your normal soap after using and rinsing off  the CHG Soap.                9.  Pat yourself dry with a clean towel.            10.  Wear clean pajamas.            11.  Place clean sheets on your bed the night of your first shower and do not  sleep with pets. Day of Surgery : Do not apply any lotions/deodorants the morning of surgery.  Please wear clean clothes to the hospital/surgery center.  FAILURE TO FOLLOW THESE INSTRUCTIONS MAY RESULT IN THE CANCELLATION OF YOUR SURGERY PATIENT SIGNATURE_________________________________  NURSE SIGNATURE__________________________________  ________________________________________________________________________

## 2020-10-16 ENCOUNTER — Telehealth: Payer: Self-pay | Admitting: *Deleted

## 2020-10-16 ENCOUNTER — Inpatient Hospital Stay (HOSPITAL_COMMUNITY)
Admission: RE | Admit: 2020-10-16 | Discharge: 2020-10-16 | Disposition: A | Discharge status: Home / Self Care | Payer: Medicare Other | Source: Ambulatory Visit

## 2020-10-16 NOTE — Progress Notes (Signed)
PT no show fo rpreop appt.  Called pt at home and pt states he is not having surgery.  PT stated he informed someone on Monday.  Called Lossie Faes at [facility] urology and made her aware.  Also informed Meridee Score.

## 2020-10-16 NOTE — Progress Notes (Signed)
Anesthesia Review:  PCP: Cardiologist :DR Heide Scales- LOV 10/07/20  Chest x-ray : EKG : Echo : Stress test: Cardiac Cath :  Activity level:  Sleep Study/ CPAP : Fasting Blood Sugar :      / Checks Blood Sugar -- times a day:   Blood Thinner/ Instructions /Last Dose: ASA / Instructions/ Last Dose :   81 mg Aspirin Eliquis DM-  Hgba1c-

## 2020-10-16 NOTE — Telephone Encounter (Signed)
Patient daughter is calling for test results done earlier this week.Please advise.

## 2020-10-18 ENCOUNTER — Ambulatory Visit (HOSPITAL_COMMUNITY): Admission: RE | Admit: 2020-10-18 | Payer: Medicare Other | Source: Home / Self Care | Admitting: Urology

## 2020-10-18 ENCOUNTER — Encounter (HOSPITAL_COMMUNITY): Admission: RE | Payer: Self-pay | Source: Home / Self Care

## 2020-10-18 SURGERY — CIRCUMCISION, ADULT
Anesthesia: General

## 2020-11-29 ENCOUNTER — Encounter: Payer: Self-pay | Admitting: Podiatry

## 2020-11-29 ENCOUNTER — Other Ambulatory Visit: Payer: Self-pay

## 2020-11-29 ENCOUNTER — Ambulatory Visit (INDEPENDENT_AMBULATORY_CARE_PROVIDER_SITE_OTHER): Payer: Medicare Other | Admitting: Podiatry

## 2020-11-29 DIAGNOSIS — N182 Chronic kidney disease, stage 2 (mild): Secondary | ICD-10-CM

## 2020-11-29 DIAGNOSIS — M79675 Pain in left toe(s): Secondary | ICD-10-CM

## 2020-11-29 DIAGNOSIS — Z7901 Long term (current) use of anticoagulants: Secondary | ICD-10-CM

## 2020-11-29 DIAGNOSIS — B351 Tinea unguium: Secondary | ICD-10-CM | POA: Diagnosis not present

## 2020-11-29 DIAGNOSIS — M79674 Pain in right toe(s): Secondary | ICD-10-CM

## 2020-11-29 NOTE — Progress Notes (Signed)
This patient returns to my office for at risk foot care.  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  Patient is taking eliquis.  This patient is unable to cut nails himself since the patient cannot reach his nails.These nails are painful walking and wearing shoes.  This patient presents for at risk foot care today.  General Appearance  Alert, conversant and in no acute stress.  Vascular  Dorsalis pedis and posterior tibial  pulses are palpable  bilaterally.  Capillary return is within normal limits  bilaterally. Temperature is within normal limits  bilaterally.  Neurologic  Senn-Weinstein monofilament wire test within normal limits  bilaterally. Muscle power within normal limits bilaterally.  Nails Thick disfigured discolored nails with subungual debris  from hallux to fifth toes bilaterally. No evidence of bacterial infection or drainage bilaterally.  Orthopedic  No limitations of motion  feet .  No crepitus or effusions noted.  No bony pathology or digital deformities noted.  HAV  B/L.  Skin  normotropic skin with no porokeratosis noted bilaterally.  No signs of infections or ulcers noted.     Onychomycosis  Pain in right toes  Pain in left toes  Consent was obtained for treatment procedures.   Mechanical debridement of nails 1-5  bilaterally performed with a nail nipper.  Filed with dremel without incident.    Return office visit     3 months                 Told patient to return for periodic foot care and evaluation due to potential at risk complications.   Zeina Akkerman DPM   

## 2020-12-05 ENCOUNTER — Ambulatory Visit: Payer: Medicare Other | Admitting: Podiatry

## 2020-12-05 ENCOUNTER — Other Ambulatory Visit: Payer: Self-pay | Admitting: Family Medicine

## 2020-12-05 DIAGNOSIS — R42 Dizziness and giddiness: Secondary | ICD-10-CM

## 2020-12-10 ENCOUNTER — Ambulatory Visit: Payer: Medicare Other | Admitting: Podiatry

## 2020-12-16 ENCOUNTER — Other Ambulatory Visit: Payer: Self-pay

## 2020-12-16 ENCOUNTER — Ambulatory Visit (INDEPENDENT_AMBULATORY_CARE_PROVIDER_SITE_OTHER): Payer: Medicare Other | Admitting: Podiatry

## 2020-12-16 DIAGNOSIS — B353 Tinea pedis: Secondary | ICD-10-CM

## 2020-12-16 DIAGNOSIS — I87322 Chronic venous hypertension (idiopathic) with inflammation of left lower extremity: Secondary | ICD-10-CM | POA: Diagnosis not present

## 2020-12-16 DIAGNOSIS — I872 Venous insufficiency (chronic) (peripheral): Secondary | ICD-10-CM | POA: Diagnosis not present

## 2020-12-16 MED ORDER — CLOTRIMAZOLE-BETAMETHASONE 1-0.05 % EX CREA: 1.0000 "application " | TOPICAL_CREAM | Freq: Two times a day (BID) | CUTANEOUS | 0 refills | Status: DC

## 2020-12-16 NOTE — Progress Notes (Signed)
  Subjective:  Patient ID: William Matthews, male    DOB: 1950-03-21,  MRN: [id number]  Chief Complaint  Patient presents with   Foot Swelling      Bil foot pain , swelling and rashes.     70 y.o. male presents with the above complaint. History confirmed with patient.  He presents today with a new issue of a rash on both feet that is itching between the toes and around the feet.  This worsens when his legs swell  Objective:  Physical Exam: warm, good capillary refill, no trophic changes or ulcerative lesions, normal sensory exam, venous stasis dermatitis noted, and significant varicosities, he has a previous saphenous vein harvest site on the right leg, interdigital scaling and maceration with scaling and petechial rash on both feet.  Assessment:   1. Tinea pedis of both feet   2. 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  Discussed the etiology and treatment options for tinea pedis.  Discussed topical and oral treatment.  Recommended topical treatment with Lotrisone cream.  He would not be a good candidate for oral therapy due to his Eliquis..  This was sent to the patient's pharmacy.  Also discussed appropriate foot hygiene, use of antifungal spray such as Tinactin in shoes, as well as cleaning foot surfaces such as showers and bathroom floors with bleach.  For the edema he has a think is also contributing to the dermatitis and stasis.  Recommended he elevate his legs after activity wear compression stockings that are knee-high and discussed with his PCP regarding the dosing of his Lasix which was just recently increased.   Return if symptoms worsen or fail to improve.

## 2020-12-26 ENCOUNTER — Inpatient Hospital Stay: Admission: RE | Admit: 2020-12-26 | Payer: Medicare Other | Source: Ambulatory Visit

## 2021-03-05 ENCOUNTER — Ambulatory Visit: Payer: Medicare Other | Admitting: Podiatry

## 2021-03-21 ENCOUNTER — Encounter: Payer: Self-pay | Admitting: Podiatry

## 2021-03-21 ENCOUNTER — Other Ambulatory Visit: Payer: Self-pay

## 2021-03-21 ENCOUNTER — Ambulatory Visit (INDEPENDENT_AMBULATORY_CARE_PROVIDER_SITE_OTHER): Payer: Medicare Other | Admitting: Podiatry

## 2021-03-21 DIAGNOSIS — Z7901 Long term (current) use of anticoagulants: Secondary | ICD-10-CM | POA: Diagnosis not present

## 2021-03-21 DIAGNOSIS — B351 Tinea unguium: Secondary | ICD-10-CM

## 2021-03-21 DIAGNOSIS — M79675 Pain in left toe(s): Secondary | ICD-10-CM | POA: Diagnosis not present

## 2021-03-21 DIAGNOSIS — N182 Chronic kidney disease, stage 2 (mild): Secondary | ICD-10-CM | POA: Diagnosis not present

## 2021-03-21 DIAGNOSIS — M79674 Pain in right toe(s): Secondary | ICD-10-CM

## 2021-03-21 NOTE — Progress Notes (Signed)
This patient returns to my office for at risk foot care.  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  Patient is taking eliquis.  This patient is unable to cut nails himself since the patient cannot reach his nails.These nails are painful walking and wearing shoes.  This patient presents for at risk foot care today.  General Appearance  Alert, conversant and in no acute stress.  Vascular  Dorsalis pedis and posterior tibial  pulses are palpable  bilaterally.  Capillary return is within normal limits  bilaterally. Temperature is within normal limits  bilaterally.  Neurologic  Senn-Weinstein monofilament wire test within normal limits  bilaterally. Muscle power within normal limits bilaterally.  Nails Thick disfigured discolored nails with subungual debris  from hallux to fifth toes bilaterally. No evidence of bacterial infection or drainage bilaterally.  Orthopedic  No limitations of motion  feet .  No crepitus or effusions noted.  No bony pathology or digital deformities noted.  HAV  B/L.  Skin  normotropic skin with no porokeratosis noted bilaterally.  No signs of infections or ulcers noted.     Onychomycosis  Pain in right toes  Pain in left toes  Consent was obtained for treatment procedures.   Mechanical debridement of nails 1-5  bilaterally performed with a nail nipper.  Filed with dremel without incident.    Return office visit     3 months                 Told patient to return for periodic foot care and evaluation due to potential at risk complications.   Axel Frisk DPM   

## 2022-01-30 ENCOUNTER — Other Ambulatory Visit (HOSPITAL_BASED_OUTPATIENT_CLINIC_OR_DEPARTMENT_OTHER): Payer: Self-pay | Admitting: Registered Nurse

## 2022-01-30 DIAGNOSIS — K7400 Hepatic fibrosis, unspecified: Secondary | ICD-10-CM

## 2022-02-04 ENCOUNTER — Ambulatory Visit (HOSPITAL_BASED_OUTPATIENT_CLINIC_OR_DEPARTMENT_OTHER): Payer: Medicare (Managed Care)

## 2022-02-05 ENCOUNTER — Ambulatory Visit (HOSPITAL_COMMUNITY)
Admission: RE | Admit: 2022-02-05 | Discharge: 2022-02-05 | Disposition: A | Discharge status: Home / Self Care | Payer: Medicare (Managed Care) | Source: Ambulatory Visit | Attending: Registered Nurse | Admitting: Registered Nurse

## 2022-02-05 DIAGNOSIS — K7400 Hepatic fibrosis, unspecified: Secondary | ICD-10-CM | POA: Diagnosis present

## 2022-03-03 DIAGNOSIS — L309 Dermatitis, unspecified: Secondary | ICD-10-CM | POA: Diagnosis not present

## 2022-03-03 DIAGNOSIS — I130 Hypertensive heart and chronic kidney disease with heart failure and stage 1 through stage 4 chronic kidney disease, or unspecified chronic kidney disease: Secondary | ICD-10-CM | POA: Diagnosis not present

## 2022-03-03 DIAGNOSIS — Z6841 Body Mass Index (BMI) 40.0 and over, adult: Secondary | ICD-10-CM | POA: Diagnosis not present

## 2022-03-03 DIAGNOSIS — J069 Acute upper respiratory infection, unspecified: Secondary | ICD-10-CM | POA: Diagnosis not present

## 2022-03-09 DIAGNOSIS — R310 Gross hematuria: Secondary | ICD-10-CM | POA: Diagnosis not present

## 2022-03-09 DIAGNOSIS — N529 Male erectile dysfunction, unspecified: Secondary | ICD-10-CM | POA: Diagnosis not present

## 2022-03-17 DIAGNOSIS — Z79899 Other long term (current) drug therapy: Secondary | ICD-10-CM | POA: Diagnosis not present

## 2022-03-26 ENCOUNTER — Encounter (HOSPITAL_COMMUNITY): Payer: Self-pay

## 2022-03-26 ENCOUNTER — Observation Stay (HOSPITAL_COMMUNITY)
Admission: EM | Admit: 2022-03-26 | Discharge: 2022-03-28 | Disposition: A | Discharge status: Home / Self Care | Payer: Medicare HMO | Attending: Internal Medicine | Admitting: Internal Medicine

## 2022-03-26 ENCOUNTER — Other Ambulatory Visit: Payer: Self-pay

## 2022-03-26 ENCOUNTER — Observation Stay (HOSPITAL_COMMUNITY): Payer: Medicare HMO

## 2022-03-26 ENCOUNTER — Emergency Department (HOSPITAL_COMMUNITY): Payer: Medicare HMO

## 2022-03-26 DIAGNOSIS — Z955 Presence of coronary angioplasty implant and graft: Secondary | ICD-10-CM | POA: Insufficient documentation

## 2022-03-26 DIAGNOSIS — I4891 Unspecified atrial fibrillation: Secondary | ICD-10-CM | POA: Diagnosis present

## 2022-03-26 DIAGNOSIS — R531 Weakness: Secondary | ICD-10-CM | POA: Diagnosis not present

## 2022-03-26 DIAGNOSIS — E039 Hypothyroidism, unspecified: Secondary | ICD-10-CM | POA: Insufficient documentation

## 2022-03-26 DIAGNOSIS — N1831 Chronic kidney disease, stage 3a: Secondary | ICD-10-CM | POA: Insufficient documentation

## 2022-03-26 DIAGNOSIS — I480 Paroxysmal atrial fibrillation: Secondary | ICD-10-CM | POA: Diagnosis not present

## 2022-03-26 DIAGNOSIS — I509 Heart failure, unspecified: Secondary | ICD-10-CM | POA: Diagnosis not present

## 2022-03-26 DIAGNOSIS — I6612 Occlusion and stenosis of left anterior cerebral artery: Secondary | ICD-10-CM | POA: Diagnosis not present

## 2022-03-26 DIAGNOSIS — R29810 Facial weakness: Secondary | ICD-10-CM | POA: Diagnosis not present

## 2022-03-26 DIAGNOSIS — I10 Essential (primary) hypertension: Secondary | ICD-10-CM | POA: Diagnosis not present

## 2022-03-26 DIAGNOSIS — I2510 Atherosclerotic heart disease of native coronary artery without angina pectoris: Secondary | ICD-10-CM | POA: Diagnosis not present

## 2022-03-26 DIAGNOSIS — Z7982 Long term (current) use of aspirin: Secondary | ICD-10-CM | POA: Insufficient documentation

## 2022-03-26 DIAGNOSIS — Z7901 Long term (current) use of anticoagulants: Secondary | ICD-10-CM | POA: Diagnosis not present

## 2022-03-26 DIAGNOSIS — I130 Hypertensive heart and chronic kidney disease with heart failure and stage 1 through stage 4 chronic kidney disease, or unspecified chronic kidney disease: Secondary | ICD-10-CM | POA: Diagnosis not present

## 2022-03-26 DIAGNOSIS — I5022 Chronic systolic (congestive) heart failure: Secondary | ICD-10-CM | POA: Diagnosis present

## 2022-03-26 DIAGNOSIS — R4781 Slurred speech: Secondary | ICD-10-CM | POA: Diagnosis not present

## 2022-03-26 DIAGNOSIS — Z7984 Long term (current) use of oral hypoglycemic drugs: Secondary | ICD-10-CM | POA: Insufficient documentation

## 2022-03-26 DIAGNOSIS — G459 Transient cerebral ischemic attack, unspecified: Secondary | ICD-10-CM

## 2022-03-26 DIAGNOSIS — E785 Hyperlipidemia, unspecified: Secondary | ICD-10-CM | POA: Diagnosis present

## 2022-03-26 DIAGNOSIS — I6521 Occlusion and stenosis of right carotid artery: Secondary | ICD-10-CM | POA: Insufficient documentation

## 2022-03-26 DIAGNOSIS — R42 Dizziness and giddiness: Secondary | ICD-10-CM | POA: Diagnosis not present

## 2022-03-26 DIAGNOSIS — Z951 Presence of aortocoronary bypass graft: Secondary | ICD-10-CM | POA: Insufficient documentation

## 2022-03-26 LAB — CBC WITH DIFFERENTIAL/PLATELET
Abs Immature Granulocytes: 0.04 10*3/uL (ref 0.00–0.07)
Basophils Absolute: 0.1 10*3/uL (ref 0.0–0.1)
Basophils Relative: 1 %
Eosinophils Absolute: 0.3 10*3/uL (ref 0.0–0.5)
Eosinophils Relative: 3 %
HCT: 44.6 % (ref 39.0–52.0)
Hemoglobin: 15.2 g/dL (ref 13.0–17.0)
Immature Granulocytes: 1 %
Lymphocytes Relative: 25 %
Lymphs Abs: 2.2 10*3/uL (ref 0.7–4.0)
MCH: 31.1 pg (ref 26.0–34.0)
MCHC: 34.1 g/dL (ref 30.0–36.0)
MCV: 91.4 fL (ref 80.0–100.0)
Monocytes Absolute: 0.8 10*3/uL (ref 0.1–1.0)
Monocytes Relative: 9 %
Neutro Abs: 5.5 10*3/uL (ref 1.7–7.7)
Neutrophils Relative %: 61 %
Platelets: 178 10*3/uL (ref 150–400)
RBC: 4.88 MIL/uL (ref 4.22–5.81)
RDW: 13.2 % (ref 11.5–15.5)
WBC: 8.7 10*3/uL (ref 4.0–10.5)
nRBC: 0 % (ref 0.0–0.2)

## 2022-03-26 LAB — URINALYSIS, ROUTINE W REFLEX MICROSCOPIC
Bilirubin Urine: NEGATIVE
Glucose, UA: NEGATIVE mg/dL
Hgb urine dipstick: NEGATIVE
Ketones, ur: NEGATIVE mg/dL
Leukocytes,Ua: NEGATIVE
Nitrite: NEGATIVE
Protein, ur: NEGATIVE mg/dL
Specific Gravity, Urine: 1.008 (ref 1.005–1.030)
pH: 5 (ref 5.0–8.0)

## 2022-03-26 LAB — BASIC METABOLIC PANEL
Anion gap: 9 (ref 5–15)
BUN: 16 mg/dL (ref 8–23)
CO2: 24 mmol/L (ref 22–32)
Calcium: 9.1 mg/dL (ref 8.9–10.3)
Chloride: 103 mmol/L (ref 98–111)
Creatinine, Ser: 1.38 mg/dL — ABNORMAL HIGH (ref 0.61–1.24)
GFR, Estimated: 54 mL/min — ABNORMAL LOW (ref >60)
Glucose, Bld: 110 mg/dL — ABNORMAL HIGH (ref 70–99)
Potassium: 3.8 mmol/L (ref 3.5–5.1)
Sodium: 136 mmol/L (ref 135–145)

## 2022-03-26 LAB — PROTIME-INR
INR: 1.2 (ref 0.8–1.2)
Prothrombin Time: 14.9 seconds (ref 11.4–15.2)

## 2022-03-26 LAB — APTT: aPTT: 31 seconds (ref 24–36)

## 2022-03-26 LAB — CBG MONITORING, ED: Glucose-Capillary: 117 mg/dL — ABNORMAL HIGH (ref 70–99)

## 2022-03-26 MED ORDER — SENNOSIDES-DOCUSATE SODIUM 8.6-50 MG PO TABS
1.0000 | ORAL_TABLET | Freq: Every evening | ORAL | Status: DC | PRN
  Administered 2022-03-27: 1 via ORAL
  Filled 2022-03-26: qty 1

## 2022-03-26 MED ORDER — LORAZEPAM 0.5 MG PO TABS: 0.5000 mg | ORAL_TABLET | Freq: Once | ORAL | Status: DC | PRN

## 2022-03-26 MED ORDER — ACETAMINOPHEN 160 MG/5ML PO SOLN: 650.0000 mg | ORAL | Status: DC | PRN

## 2022-03-26 MED ORDER — CARVEDILOL 3.125 MG PO TABS
3.1250 mg | ORAL_TABLET | Freq: Two times a day (BID) | ORAL | Status: DC
  Administered 2022-03-26 – 2022-03-28 (×4): 3.125 mg via ORAL
  Filled 2022-03-26 (×4): qty 1

## 2022-03-26 MED ORDER — HYDRALAZINE HCL 25 MG PO TABS
25.0000 mg | ORAL_TABLET | Freq: Two times a day (BID) | ORAL | Status: DC
  Filled 2022-03-26: qty 1

## 2022-03-26 MED ORDER — STROKE: EARLY STAGES OF RECOVERY BOOK
Freq: Once | Status: AC
  Filled 2022-03-26: qty 1

## 2022-03-26 MED ORDER — ATORVASTATIN CALCIUM 10 MG PO TABS
20.0000 mg | ORAL_TABLET | Freq: Every day | ORAL | Status: DC
  Administered 2022-03-26 – 2022-03-27 (×2): 20 mg via ORAL
  Filled 2022-03-26 (×2): qty 2

## 2022-03-26 MED ORDER — PANTOPRAZOLE SODIUM 40 MG PO TBEC
80.0000 mg | DELAYED_RELEASE_TABLET | Freq: Every day | ORAL | Status: DC
  Administered 2022-03-27 – 2022-03-28 (×2): 80 mg via ORAL
  Filled 2022-03-26 (×2): qty 2

## 2022-03-26 MED ORDER — AMIODARONE HCL 200 MG PO TABS
100.0000 mg | ORAL_TABLET | Freq: Every day | ORAL | Status: DC
  Administered 2022-03-27 – 2022-03-28 (×2): 100 mg via ORAL
  Filled 2022-03-26 (×2): qty 1

## 2022-03-26 MED ORDER — ASPIRIN 81 MG PO TBEC
81.0000 mg | DELAYED_RELEASE_TABLET | Freq: Every day | ORAL | Status: DC
  Administered 2022-03-27 – 2022-03-28 (×2): 81 mg via ORAL
  Filled 2022-03-26 (×2): qty 1

## 2022-03-26 MED ORDER — ACETAMINOPHEN 650 MG RE SUPP: 650.0000 mg | RECTAL | Status: DC | PRN

## 2022-03-26 MED ORDER — APIXABAN 5 MG PO TABS
5.0000 mg | ORAL_TABLET | Freq: Two times a day (BID) | ORAL | Status: DC
  Administered 2022-03-26 – 2022-03-28 (×4): 5 mg via ORAL
  Filled 2022-03-26 (×4): qty 1

## 2022-03-26 MED ORDER — FUROSEMIDE 40 MG PO TABS
40.0000 mg | ORAL_TABLET | Freq: Every day | ORAL | Status: DC
  Administered 2022-03-27 – 2022-03-28 (×2): 40 mg via ORAL
  Filled 2022-03-26 (×2): qty 1

## 2022-03-26 MED ORDER — IOHEXOL 350 MG/ML SOLN
75.0000 mL | Freq: Once | INTRAVENOUS | Status: AC | PRN
  Administered 2022-03-26: 75 mL via INTRAVENOUS

## 2022-03-26 MED ORDER — ACETAMINOPHEN 325 MG PO TABS
650.0000 mg | ORAL_TABLET | ORAL | Status: DC | PRN
  Filled 2022-03-26: qty 2

## 2022-03-26 MED ORDER — LOSARTAN POTASSIUM 50 MG PO TABS
100.0000 mg | ORAL_TABLET | Freq: Every day | ORAL | Status: DC
  Filled 2022-03-26: qty 2

## 2022-03-26 NOTE — ED Triage Notes (Addendum)
Pt BIB [facility] from home d/t having TIA s/s since January 20th. Pt has a photo of his face showing a droop that happens periodically the last month, pt reports vertigo as well, mild Lt sided facial droop in triage, A/Ox4, VSS, A/Ox4. 138/88, CBG 152.

## 2022-03-26 NOTE — Assessment & Plan Note (Addendum)
Sinus rhythm on admission with controlled rate. -Continue amiodarone 100 mg daily -Continue Coreg 3.125 mg twice daily -Continue Eliquis 5 mg twice daily (has been taking 10 mg once daily)

## 2022-03-26 NOTE — Assessment & Plan Note (Signed)
Euvolemic on admission.  Last EF was 40-45% 09/2020 by report in Care Everywhere. -Follow echocardiogram -Continue Lasix 40 mg daily -Continue Coreg 3.125 mg twice daily -Continue losartan 100 mg daily

## 2022-03-26 NOTE — Assessment & Plan Note (Signed)
Resume home Coreg, hydralazine, losartan tomorrow.

## 2022-03-26 NOTE — Hospital Course (Signed)
William Matthews is a 72 y.o. male with medical history significant for PAF on Eliquis, CAD s/p CABG and DES, CKD stage IIIa, HTN, HLD, hypothyroidism, BPH who presented with transient left facial droop and slurred speech and admitted for CVA workup.

## 2022-03-26 NOTE — Assessment & Plan Note (Signed)
Continue atorvastatin

## 2022-03-26 NOTE — Progress Notes (Signed)
The patient is admitted from ED to [facility] x 4 with some forgetfulness. Patient is oriented to staff, ascom/call bell He  passed the [facility] swallow screen. Patient and son voiced no concern. Will continue to monitor.

## 2022-03-26 NOTE — Assessment & Plan Note (Deleted)
Continue Euthyrox.

## 2022-03-26 NOTE — ED Notes (Signed)
Report received from previous nurse, pt A&O x4, eating with family at bedside. Denies needs/complaints. Vitals stable. Call bell in reach. No acute distress noted.

## 2022-03-26 NOTE — ED Notes (Signed)
Pt transported to CT ?

## 2022-03-26 NOTE — ED Provider Triage Note (Signed)
Emergency Medicine Provider Triage Evaluation Note  William Matthews , a 72 y.o. male  was evaluated in triage.  Pt complains of being sent by primary care provider for concern for stroke.  He states that in December or January he had facial droop that his family noticed.  He states that he did not go to the doctor at that time but instead went to his bedroom and prayed and went to bed.  He states that the next morning he woke up with improvement in his symptoms.  He states that he has also had intermittent vertigo and feels like his speech is slurred which has been ongoing for a month.  He went to the doctor and they instructed him to go to the emergency department.  He states that when he realized he was not in ago they called EMS..  Denies changes to his vision, numbness, tingling to the face or extremities or weakness in his extremities.  Review of Systems  Positive: See above  Negative:   Physical Exam  BP (!) 149/90 (BP Location: Right Arm)   Pulse 72   Temp 98.5 F (36.9 C)   Resp 16   SpO2 98%  Gen:   Awake, no distress   Resp:  Normal effort  MSK:   Moves extremities without difficulty  Other:  Mild right sided mouth droop, mild weakness to right eyebrow raise. Strength 4/5 LUE with tremor on resistance. RUE/BLE 5/5 strength. No pronator drift.   Medical Decision Making  Medically screening exam initiated at 2:17 PM.  Appropriate orders placed.  William Matthews was informed that the remainder of the evaluation will be completed by another provider, this initial triage assessment does not replace that evaluation, and the importance of remaining in the ED until their evaluation is complete.     Mickie Hillier, PA-C 03/26/22 [phone]

## 2022-03-26 NOTE — ED Notes (Signed)
ED TO INPATIENT HANDOFF REPORT  ED Nurse Name and Phone #:   Edd Arbour [phone]  S Name/Age/Gender William Matthews 72 y.o. male Room/Bed: 040C/040C  Code Status   Code Status: Prior  Home/SNF/Other Home Patient oriented to: self, place, time, and situation Is this baseline? Yes   Triage Complete: Triage complete  Chief Complaint Slurred speech [R47.81]  Triage Note Pt BIB [facility] from home d/t having TIA s/s since January 20th. Pt has a photo of his face showing a droop that happens periodically the last month, pt reports vertigo as well, mild Lt sided facial droop in triage, A/Ox4, VSS, A/Ox4. 138/88, CBG 152.    Allergies Allergies  Allergen Reactions   Ciprofloxacin Hcl     Unknown    Peanut-Containing Drug Products     Unknown     Level of Care/Admitting Diagnosis ED Disposition     ED Disposition  Admit   Condition  --   Comment  Hospital Area: [place] [[id number]]  Level of Care: Telemetry Medical [104]  May place patient in observation at [facility] or [place] if equivalent level of care is available:: No  Covid Evaluation: Asymptomatic - no recent exposure (last 10 days) testing not required  Diagnosis: Slurred speech [phone]  Admitting Physician: Lenore Cordia [id number]  Attending Physician: Lenore Cordia [id number]          B Medical/Surgery History Past Medical History:  Diagnosis Date   Anxiety    CAD (coronary artery disease)    a. s/p CABG 1994 with LIMA to D2 and LAD, SVG to D1 and ramus intermedius. b. acute inferior MI 2001 s/p rescue PTCA/stent to [facility].  b. NSTEMI 02/2014: s/p LHC with DES to oLCx c. 09/2015 PCI with DES to left main/ostial LCx   Chronic bronchitis ([place])    "they say I get it q yr" (10/03/2015)   CKD (chronic kidney disease), stage II    Depression    ED (erectile dysfunction)    GERD (gastroesophageal reflux disease)    Hyperlipidemia    Hypertension    Ischemic cardiomyopathy    a. EF 48% by nuc  in 2012.   Myocardial infarction [facility]) "several"   Nephrolithiasis    Obesity    Prediabetes    Past Surgical History:  Procedure Laterality Date   CARDIAC CATHETERIZATION N/A 02/19/2015   Procedure: Left Heart Cath and Cors/Grafts Angiography;  Surgeon: Burnell Blanks, MD;  LAD & RCA 100%, LIMA-LAD 50%, SVG-OM-D1 100% stenosis, distal limb D1-OM ok, CFX 99%   CARDIAC CATHETERIZATION  02/19/2015   Procedure: Coronary Stent Intervention;  Surgeon: Burnell Blanks, MD; 3.5 x 16 mm Promus Premier DES to the ostial CFX    CARDIAC CATHETERIZATION N/A 10/03/2015   Procedure: Left Heart Cath and Cors/Grafts Angiography;  Surgeon: Peter M Martinique, MD;  Location: [place] CV LAB;  Service: Cardiovascular;  Laterality: N/A;   CARDIAC CATHETERIZATION N/A 10/03/2015   Procedure: Coronary Stent Intervention;  Surgeon: Peter M Martinique, MD;  Location: [place] CV LAB;  Service: Cardiovascular;  Laterality: N/A;  DES- Resolute 4.0x15 to distal left main into the ostial circumflex artery   CARDIAC CATHETERIZATION N/A 10/03/2015   Procedure: Intravascular Ultrasound/IVUS;  Surgeon: Peter M Martinique, MD;  Location: [place] CV LAB;  Service: Cardiovascular;  Laterality: N/A;   CORONARY ANGIOPLASTY WITH STENT PLACEMENT  1994 - 2001 X 5   "stents each time"   CORONARY ANGIOPLASTY WITH STENT PLACEMENT  10/03/2015  CORONARY ARTERY BYPASS GRAFT  1994   LIMA-D2-LAD, SVG-D1-OM2   CYSTOSCOPY W/ STONE MANIPULATION       A IV Location/Drains/Wounds Patient Lines/Drains/Airways Status     Active Line/Drains/Airways     Name Placement date Placement time Site Days   Peripheral IV 03/26/22 20 G Left Hand 03/26/22  1855  Hand  less than 1            Intake/Output Last 24 hours No intake or output data in the 24 hours ending 03/26/22 1937  Labs/Imaging Results for orders placed or performed during the hospital encounter of 03/26/22 (from the past 48 hour(s))  Basic metabolic panel      Status: Abnormal   Collection Time: 03/26/22  2:50 PM  Result Value Ref Range   Sodium 136 135 - 145 mmol/L   Potassium 3.8 3.5 - 5.1 mmol/L   Chloride 103 98 - 111 mmol/L   CO2 24 22 - 32 mmol/L   Glucose, Bld 110 (H) 70 - 99 mg/dL    Comment: Glucose reference range applies only to samples taken after fasting for at least 8 hours.   BUN 16 8 - 23 mg/dL   Creatinine, Ser 1.38 (H) 0.61 - 1.24 mg/dL   Calcium 9.1 8.9 - 10.3 mg/dL   GFR, Estimated 54 (L) >60 mL/min    Comment: (NOTE) Calculated using the CKD-EPI Creatinine Equation (2021)    Anion gap 9 5 - 15    Comment: Performed at [facility] [address], [place], [place] [address]  CBC with Differential     Status: None   Collection Time: 03/26/22  2:50 PM  Result Value Ref Range   WBC 8.7 4.0 - 10.5 K/uL   RBC 4.88 4.22 - 5.81 MIL/uL   Hemoglobin 15.2 13.0 - 17.0 g/dL   HCT 44.6 39.0 - 52.0 %   MCV 91.4 80.0 - 100.0 fL   MCH 31.1 26.0 - 34.0 pg   MCHC 34.1 30.0 - 36.0 g/dL   RDW 13.2 11.5 - 15.5 %   Platelets 178 150 - 400 K/uL   nRBC 0.0 0.0 - 0.2 %   Neutrophils Relative % 61 %   Neutro Abs 5.5 1.7 - 7.7 K/uL   Lymphocytes Relative 25 %   Lymphs Abs 2.2 0.7 - 4.0 K/uL   Monocytes Relative 9 %   Monocytes Absolute 0.8 0.1 - 1.0 K/uL   Eosinophils Relative 3 %   Eosinophils Absolute 0.3 0.0 - 0.5 K/uL   Basophils Relative 1 %   Basophils Absolute 0.1 0.0 - 0.1 K/uL   Immature Granulocytes 1 %   Abs Immature Granulocytes 0.04 0.00 - 0.07 K/uL    Comment: Performed at [facility], [address], [place], [place] [address]  Protime-INR     Status: None   Collection Time: 03/26/22  2:50 PM  Result Value Ref Range   Prothrombin Time 14.9 11.4 - 15.2 seconds   INR 1.2 0.8 - 1.2    Comment: (NOTE) INR goal varies based on device and disease states. Performed at [facility], [place] [address], [place], [place] [address]   APTT     Status: None   Collection Time: 03/26/22  2:50 PM  Result  Value Ref Range   aPTT 31 24 - 36 seconds    Comment: Performed at [facility] [address], [place], [place] [address]  CBG monitoring, ED     Status: Abnormal  Collection Time: 03/26/22  4:05 PM  Result Value Ref Range   Glucose-Capillary 117 (H) 70 - 99 mg/dL    Comment: Glucose reference range applies only to samples taken after fasting for at least 8 hours.  Urinalysis, Routine w reflex microscopic -Urine, Clean Catch     Status: Abnormal   Collection Time: 03/26/22  4:14 PM  Result Value Ref Range   Color, Urine STRAW (A) YELLOW   APPearance CLEAR CLEAR   Specific Gravity, Urine 1.008 1.005 - 1.030   pH 5.0 5.0 - 8.0   Glucose, UA NEGATIVE NEGATIVE mg/dL   Hgb urine dipstick NEGATIVE NEGATIVE   Bilirubin Urine NEGATIVE NEGATIVE   Ketones, ur NEGATIVE NEGATIVE mg/dL   Protein, ur NEGATIVE NEGATIVE mg/dL   Nitrite NEGATIVE NEGATIVE   Leukocytes,Ua NEGATIVE NEGATIVE    Comment: Performed at [facility] [address] [place], [place] [address]   CT Head Wo Contrast  Result Date: 03/26/2022 CLINICAL DATA:  Transient ischemic attack. EXAM: CT HEAD WITHOUT CONTRAST TECHNIQUE: Contiguous axial images were obtained from the base of the skull through the vertex without intravenous contrast. RADIATION DOSE REDUCTION: This exam was performed according to the departmental dose-optimization program which includes automated exposure control, adjustment of the mA and/or kV according to patient size and/or use of iterative reconstruction technique. COMPARISON:  Head CT 09/02/2012. FINDINGS: Brain: No acute intracranial hemorrhage. Mild chronic small-vessel disease. Gray-white differentiation is otherwise preserved. Mild central pattern of volume loss. No extra-axial collection. Basilar cisterns are patent. Vascular: No hyperdense vessel or unexpected calcification. Skull: No calvarial fracture or suspicious bone lesion. Skull base is unremarkable. Sinuses/Orbits: Unremarkable.  Other: None. IMPRESSION: 1. No acute intracranial abnormality. 2. Mild chronic small-vessel disease. Mild central pattern of volume loss. Electronically Signed   By: Emmit Alexanders M.D.   On: 03/26/2022 16:30    Pending Labs Unresulted Labs (From admission, onward)    None       Vitals/Pain Today's Vitals   03/26/22 1700 03/26/22 1730 03/26/22 1818 03/26/22 1845  BP: 137/86 (!) 141/85 (!) 151/92   Pulse: 60 60 67   Resp: 16 17 18   $ Temp:    98.1 F (36.7 C)  TempSrc:    Oral  SpO2: 95% 99% 100%   PainSc:        Isolation Precautions No active isolations  Medications Medications  LORazepam (ATIVAN) tablet 0.5 mg (has no administration in time range)    Mobility walks     Focused Assessments Cardiac Assessment Handoff:  Cardiac Rhythm: Normal sinus rhythm Lab Results  Component Value Date   TROPONINI 0.49 (HH) 10/04/2015   No results found for: "DDIMER" Does the Patient currently have chest pain? No   , Neuro Assessment Handoff:  Swallow screen pass? Yes  Cardiac Rhythm: Normal sinus rhythm NIH Stroke Scale  Dizziness Present: No Headache Present: No Interval: Shift assessment Level of Consciousness (1a.)   : Alert, keenly responsive LOC Questions (1b. )   : Answers both questions correctly LOC Commands (1c. )   : Performs both tasks correctly Best Gaze (2. )  : Normal Visual (3. )  : No visual loss Facial Palsy (4. )    : Normal symmetrical movements Motor Arm, Left (5a. )   : No drift Motor Arm, Right (5b. ) : No drift Motor Leg, Left (6a. )  : No drift Motor Leg, Right (6b. ) : No drift Limb Ataxia (7. ): Absent Sensory (8. )  :  Normal, no sensory loss Best Language (9. )  : No aphasia Dysarthria (10. ): Normal Extinction/Inattention (11.)   : No Abnormality Complete NIHSS TOTAL: 0 Last date known well: 02/23/22   Neuro Assessment:   Neuro Checks:   Shift assessment (03/26/22 1600)  Has TPA been given? No If patient is a Neuro Trauma and  patient is going to OR before floor call report to [place] nurse: [phone] or [phone]  , Pulmonary Assessment Handoff:  Lung sounds:   O2 Device: Room Air      R Recommendations: See Admitting Provider Note  Report given to:   Additional Notes: N/a

## 2022-03-26 NOTE — Assessment & Plan Note (Signed)
S/p CABG and DES.  Denies chest pain. -Continue aspirin and statin

## 2022-03-26 NOTE — H&P (Signed)
History and Physical    William Matthews [id number] DOB: 1950/03/10 DOA: 03/26/2022  PCP: Patient, No Pcp Per  Patient coming from: Home  I have personally briefly reviewed patient's old medical records in [place]  Chief Complaint: Left facial droop, slurred speech  HPI: William Matthews is a 72 y.o. male with medical history significant for PAF on Eliquis, CAD s/p CABG and DES, CHF, CKD stage IIIa, HTN, HLD, hypothyroidism, BPH who presented to the ED for evaluation of left facial droop and slurred speech.  Patient states he initially noted left-sided facial droop beginning at the end of December associated with slurred speech.  These changes resolved the next day.  Since then he has had intermittent episodes of dizziness without syncope.  Over the last 3 days he noticed return of slurring of his speech as well as left facial droop.  He has had some issues with memory as well.  He has not had any weakness in his extremities.  ED Course  Labs/Imaging on admission: I have personally reviewed following labs and imaging studies.  Initial vitals showed BP 149/90, pulse 72, RR 16, temp 98.5 F, SpO2 98% on room air.  Labs show sodium 136, potassium 3.8, bicarb 24, BUN 16, creatinine 1.38, serum glucose 110, WBC 8.7, hemoglobin 15.2, platelets 178,000.  UA negative for UTI.  CT head without contrast negative for acute intracranial abnormality.  EDP discussed with on-call neurology, Dr. Leonel Ramsay, who recommended medical admission for stroke/TIA workup.  The hospitalist service was consulted to admit for further evaluation and management.  Review of Systems: All systems reviewed and are negative except as documented in history of present illness above.   Past Medical History:  Diagnosis Date   Anxiety    CAD (coronary artery disease)    a. s/p CABG 1994 with LIMA to D2 and LAD, SVG to D1 and ramus intermedius. b. acute inferior MI 2001 s/p rescue PTCA/stent to [facility].  b. NSTEMI  02/2014: s/p LHC with DES to oLCx c. 09/2015 PCI with DES to left main/ostial LCx   Chronic bronchitis ([place])    "they say I get it q yr" (10/03/2015)   CKD (chronic kidney disease), stage II    Depression    ED (erectile dysfunction)    GERD (gastroesophageal reflux disease)    Hyperlipidemia    Hypertension    Ischemic cardiomyopathy    a. EF 48% by nuc in 2012.   Myocardial infarction [facility]) "several"   Nephrolithiasis    Obesity    Prediabetes     Past Surgical History:  Procedure Laterality Date   CARDIAC CATHETERIZATION N/A 02/19/2015   Procedure: Left Heart Cath and Cors/Grafts Angiography;  Surgeon: Burnell Blanks, MD;  LAD & RCA 100%, LIMA-LAD 50%, SVG-OM-D1 100% stenosis, distal limb D1-OM ok, CFX 99%   CARDIAC CATHETERIZATION  02/19/2015   Procedure: Coronary Stent Intervention;  Surgeon: Burnell Blanks, MD; 3.5 x 16 mm Promus Premier DES to the ostial CFX    CARDIAC CATHETERIZATION N/A 10/03/2015   Procedure: Left Heart Cath and Cors/Grafts Angiography;  Surgeon: Peter M Martinique, MD;  Location: [place] CV LAB;  Service: Cardiovascular;  Laterality: N/A;   CARDIAC CATHETERIZATION N/A 10/03/2015   Procedure: Coronary Stent Intervention;  Surgeon: Peter M Martinique, MD;  Location: [place] CV LAB;  Service: Cardiovascular;  Laterality: N/A;  DES- Resolute 4.0x15 to distal left main into the ostial circumflex artery   CARDIAC CATHETERIZATION N/A 10/03/2015   Procedure: Intravascular  Ultrasound/IVUS;  Surgeon: Peter M Martinique, MD;  Location: [place] CV LAB;  Service: Cardiovascular;  Laterality: N/A;   CORONARY ANGIOPLASTY WITH STENT PLACEMENT  1994 - 2001 X 5   "stents each time"   CORONARY ANGIOPLASTY WITH STENT PLACEMENT  10/03/2015   CORONARY ARTERY BYPASS GRAFT  1994   LIMA-D2-LAD, SVG-D1-OM2   CYSTOSCOPY W/ STONE MANIPULATION      Social History:  reports that he has never smoked. He has never used smokeless tobacco. He reports that he does not drink  alcohol and does not use drugs.  Allergies  Allergen Reactions   Ciprofloxacin Hcl     Unknown    Peanut-Containing Drug Products     Unknown     Family History  Problem Relation Age of Onset   Coronary artery disease Mother        Died at age 39 of MI   Coronary artery disease Father        MI age 96     Prior to Admission medications   Medication Sig Start Date End Date Taking? Authorizing Provider  amiodarone (PACERONE) 200 MG tablet Take by mouth. 06/18/20   [provider]  apixaban (ELIQUIS) 2.5 MG TABS tablet Take by mouth.    [provider]  aspirin EC 81 MG tablet Take 1 tablet (81 mg total) by mouth daily. 02/16/17   Lelon Perla, MD  atorvastatin (LIPITOR) 10 MG tablet Take 10 mg by mouth at bedtime. Patient not taking: Reported on 10/10/2020 07/21/20   [provider]  atorvastatin (LIPITOR) 80 MG tablet Take 1 tablet (80 mg total) by mouth daily. 12/17/15   Lelon Perla, MD  carvedilol (COREG) 3.125 MG tablet Take 1 tablet (3.125 mg total) by mouth 2 (two) times daily. 12/17/15 03/16/16  Lelon Perla, MD  clotrimazole-betamethasone (LOTRISONE) cream Apply 1 application topically 2 (two) times daily. 12/16/20   McDonald, Stephan Minister, DPM  esomeprazole (NEXIUM) 40 MG capsule Take 40 mg by mouth daily. 04/12/20   [provider]  EUTHYROX 25 MCG tablet Take 25 mcg by mouth every morning. 06/11/20   [provider]  finasteride (PROSCAR) 5 MG tablet Take 5 mg by mouth daily. 07/20/20   [provider]  furosemide (LASIX) 40 MG tablet Take by mouth. 07/10/20   [provider]  gabapentin (NEURONTIN) 100 MG capsule Take 100 mg by mouth daily. 08/01/20   [provider]  levocetirizine (XYZAL) 5 MG tablet Take 5 mg by mouth daily. 04/12/20   [provider]  losartan (COZAAR) 25 MG tablet Take 25 mg by mouth daily. 07/01/20   [provider]  metFORMIN (GLUCOPHAGE) 500 MG tablet Take 500 mg by  mouth every morning. 07/21/20   [provider]  nitroGLYCERIN (NITROSTAT) 0.4 MG SL tablet Place 1 tablet (0.4 mg total) under the tongue every 5 (five) minutes x 3 doses as needed for chest pain. 12/17/15   Lelon Perla, MD  silodosin (RAPAFLO) 8 MG CAPS capsule Take 8 mg by mouth daily. 08/16/20   [provider]  tolterodine (DETROL LA) 4 MG 24 hr capsule Take 4 mg by mouth daily. 07/10/20   [provider]    Physical Exam: Vitals:   03/26/22 1845 03/26/22 1930 03/26/22 2015 03/26/22 2132  BP:  (!) 154/86  (!) 140/87  Pulse:  78 75 72  Resp:  20 19 18  $ Temp: 98.1 F (36.7 C)   (!) 97.5 F (36.4 C)  TempSrc: Oral   Oral  SpO2:  99% 100% 97%   Constitutional: Resting in bed, NAD, calm, comfortable Eyes: PERRL, EOMI, lids and conjunctivae normal ENMT: Mucous membranes are moist. Posterior pharynx clear of any exudate or lesions.Normal dentition.  Neck: normal, supple, no masses. Respiratory: clear to auscultation bilaterally, no wheezing, no crackles. Normal respiratory effort. No accessory muscle use.  Cardiovascular: Regular rate and rhythm, no murmurs / rubs / gallops. No extremity edema. 2+ pedal pulses. Abdomen: no tenderness, no masses palpated. No hepatosplenomegaly. Bowel sounds positive.  Musculoskeletal: no clubbing / cyanosis. No joint deformity upper and lower extremities. Good ROM, no contractures. Normal muscle tone.  Skin: no rashes, lesions, ulcers. No induration Neurologic: Sensation intact. Strength 5/5 in all 4.  Psychiatric: Alert and oriented x 3.  EKG: Personally reviewed. Sinus rhythm, rate 73, minimal ST elevation in V1.  Assessment/Plan Principal Problem:   Slurred speech Active Problems:   Paroxysmal atrial fibrillation (HCC)   Heart failure with mildly reduced ejection fraction (HFmrEF) (HCC)   Essential hypertension   CAD (coronary artery disease)   Hyperlipidemia   Chronic kidney disease, stage 3a (HCC)   William Matthews is a 72 y.o. male with medical history significant for PAF on Eliquis, CAD s/p CABG and DES, CKD stage IIIa, HTN, HLD, hypothyroidism, BPH who presented with transient left facial droop and slurred speech and admitted for CVA workup.  Assessment and Plan: * Slurred speech Presenting after transient left facial droop and slurred speech.  Neurology consulted and recommended admission for CVA/TIA workup. -CT head negative for acute changes -CTA head and neck -MRI brain -Obtain echocardiogram -Keep on telemetry, continue neurochecks -Check A1c and lipid panel -PT/OT/SLP eval -Continue Eliquis and aspirin -Neurology to follow  Paroxysmal atrial fibrillation ([place]) Sinus rhythm on admission with controlled rate. -Continue amiodarone 100 mg daily -Continue Coreg 3.125 mg twice daily -Continue Eliquis 5 mg twice daily (has been taking 10 mg once daily)  Heart failure with mildly reduced ejection fraction (HFmrEF) (HCC) Euvolemic on admission.  Last EF was 40-45% 09/2020 by report in Care Everywhere. -Follow echocardiogram -Continue Lasix 40 mg daily -Continue Coreg 3.125 mg twice daily -Continue losartan 100 mg daily  CAD (coronary artery disease) S/p CABG and DES.  Denies chest pain. -Continue aspirin and statin  Essential hypertension Resume home Coreg, hydralazine, losartan tomorrow.  Chronic kidney disease, stage 3a ([place]) Renal function stable, continue to monitor.  Hyperlipidemia Continue atorvastatin.  DVT prophylaxis:  apixaban (ELIQUIS) tablet 5 mg  Code Status: Full code, confirmed with patient on admission Family Communication: Son at bedside Disposition Plan: From home, dispo pending clinical progress Consults called: Neurology Severity of Illness: The appropriate patient status for this patient is OBSERVATION. Observation status is judged to be reasonable and necessary in order to provide the required intensity of service to ensure the patient's safety. The  patient's presenting symptoms, physical exam findings, and initial radiographic and laboratory data in the context of their medical condition is felt to place them at decreased risk for further clinical deterioration. Furthermore, it is anticipated that the patient will be medically stable for discharge from the hospital within 2 midnights of admission.   Zada Finders MD [facility]  If 7PM-7AM, please contact night-coverage [email or web address]  03/26/2022, 9:50 PM

## 2022-03-26 NOTE — ED Notes (Signed)
Pt being transported upstairs to room, no acute distress when leaving. Rise/fall of chest noted, vitals stable.

## 2022-03-26 NOTE — Assessment & Plan Note (Deleted)
Continue Proscar and Rapaflo.

## 2022-03-26 NOTE — Assessment & Plan Note (Addendum)
Presenting after transient left facial droop and slurred speech.  Neurology consulted and recommended admission for CVA/TIA workup. -CT head negative for acute changes -CTA head and neck -MRI brain -Obtain echocardiogram -Keep on telemetry, continue neurochecks -Check A1c and lipid panel -PT/OT/SLP eval -Continue Eliquis and aspirin -Neurology to follow

## 2022-03-26 NOTE — ED Provider Notes (Signed)
[facility] Provider Note   CSN: [id number] Arrival date & time: 03/26/22  1400     History  Chief Complaint  Patient presents with   Weakness   Lt side facial droop    TAVIUS POSTLEWAIT is a 72 y.o. male.  HPI    72 year old patient with history of CAD, CKD, anxiety, hypertension, hyperlipidemia comes with a chief complaint of strokelike symptoms.  Patient states that at the end of December, he noted that he had left-sided facial droop.  He also had associated slurred speech.  He went to bed praying that he will get better, next day he woke up in the facial droop had resolved.  Patient still had mild slurring of the speech.  He also started noticing since that time that he has been having intermittent episodes of dizziness, typically provoked with him getting up.  Dizziness is described as spinning sensation.  Over the last 3 days patient has had increased slurring of his speech.  Family called PCP, who advised that patient come to the emergency room immediately.  Patient denies any focal numbness, weakness.  Home Medications Prior to Admission medications   Medication Sig Start Date End Date Taking? Authorizing Provider  amiodarone (PACERONE) 200 MG tablet Take by mouth. 06/18/20   [provider]  apixaban (ELIQUIS) 2.5 MG TABS tablet Take by mouth.    [provider]  aspirin EC 81 MG tablet Take 1 tablet (81 mg total) by mouth daily. 02/16/17   Lelon Perla, MD  atorvastatin (LIPITOR) 10 MG tablet Take 10 mg by mouth at bedtime. Patient not taking: Reported on 10/10/2020 07/21/20   [provider]  atorvastatin (LIPITOR) 80 MG tablet Take 1 tablet (80 mg total) by mouth daily. 12/17/15   Lelon Perla, MD  carvedilol (COREG) 3.125 MG tablet Take 1 tablet (3.125 mg total) by mouth 2 (two) times daily. 12/17/15 03/16/16  Lelon Perla, MD  clotrimazole-betamethasone (LOTRISONE) cream Apply 1 application  topically 2 (two) times daily. 12/16/20   McDonald, Stephan Minister, DPM  esomeprazole (NEXIUM) 40 MG capsule Take 40 mg by mouth daily. 04/12/20   [provider]  EUTHYROX 25 MCG tablet Take 25 mcg by mouth every morning. 06/11/20   [provider]  finasteride (PROSCAR) 5 MG tablet Take 5 mg by mouth daily. 07/20/20   [provider]  furosemide (LASIX) 40 MG tablet Take by mouth. 07/10/20   [provider]  gabapentin (NEURONTIN) 100 MG capsule Take 100 mg by mouth daily. 08/01/20   [provider]  levocetirizine (XYZAL) 5 MG tablet Take 5 mg by mouth daily. 04/12/20   [provider]  losartan (COZAAR) 25 MG tablet Take 25 mg by mouth daily. 07/01/20   [provider]  metFORMIN (GLUCOPHAGE) 500 MG tablet Take 500 mg by mouth every morning. 07/21/20   [provider]  nitroGLYCERIN (NITROSTAT) 0.4 MG SL tablet Place 1 tablet (0.4 mg total) under the tongue every 5 (five) minutes x 3 doses as needed for chest pain. 12/17/15   Lelon Perla, MD  silodosin (RAPAFLO) 8 MG CAPS capsule Take 8 mg by mouth daily. 08/16/20   [provider]  tolterodine (DETROL LA) 4 MG 24 hr capsule Take 4 mg by mouth daily. 07/10/20   [provider]      Allergies    Ciprofloxacin hcl and Peanut-containing drug products    Review of Systems   Review  of Systems  All other systems reviewed and are negative.   Physical Exam Updated Vital Signs BP (!) 141/85   Pulse 60   Temp 98.5 F (36.9 C)   Resp 17   SpO2 99%  Physical Exam Vitals and nursing note reviewed.  Constitutional:      Appearance: He is well-developed.  HENT:     Head: Atraumatic.  Eyes:     Extraocular Movements: Extraocular movements intact.     Pupils: Pupils are equal, round, and reactive to light.  Cardiovascular:     Rate and Rhythm: Normal rate.  Pulmonary:     Effort: Pulmonary effort is normal.  Musculoskeletal:     Cervical back: Neck supple.  Skin:     General: Skin is warm.  Neurological:     Mental Status: He is alert and oriented to person, place, and time.     Cranial Nerves: No cranial nerve deficit.     Sensory: No sensory deficit.     Motor: No weakness.     Coordination: Coordination normal.     Comments: Patient has slight slurring of his speech. Family reports that there is still subtle left-sided facial droop     ED Results / Procedures / Treatments   Labs (all labs ordered are listed, but only abnormal results are displayed) Labs Reviewed  BASIC METABOLIC PANEL - Abnormal; Notable for the following components:      Result Value   Glucose, Bld 110 (*)    Creatinine, Ser 1.38 (*)    GFR, Estimated 54 (*)    All other components within normal limits  URINALYSIS, ROUTINE W REFLEX MICROSCOPIC - Abnormal; Notable for the following components:   Color, Urine STRAW (*)    All other components within normal limits  CBG MONITORING, ED - Abnormal; Notable for the following components:   Glucose-Capillary 117 (*)    All other components within normal limits  CBC WITH DIFFERENTIAL/PLATELET  PROTIME-INR  APTT    EKG EKG Interpretation  Date/Time:  Thursday March 26 2022 14:17:12 EST Ventricular Rate:  73 PR Interval:  204 QRS Duration: 114 QT Interval:  416 QTC Calculation: 458 R Axis:   -21 Text Interpretation: Normal sinus rhythm Inferior infarct , age undetermined Cannot rule out Anterior infarct , age undetermined Abnormal ECG When compared with ECG of 04-Oct-2015 04:05, PREVIOUS ECG IS PRESENT No acute changes No significant change since last tracing Confirmed by Varney Biles [id number]) on 03/26/2022 4:24:20 PM  Radiology CT Head Wo Contrast  Result Date: 03/26/2022 CLINICAL DATA:  Transient ischemic attack. EXAM: CT HEAD WITHOUT CONTRAST TECHNIQUE: Contiguous axial images were obtained from the base of the skull through the vertex without intravenous contrast. RADIATION DOSE REDUCTION: This exam was performed  according to the departmental dose-optimization program which includes automated exposure control, adjustment of the mA and/or kV according to patient size and/or use of iterative reconstruction technique. COMPARISON:  Head CT 09/02/2012. FINDINGS: Brain: No acute intracranial hemorrhage. Mild chronic small-vessel disease. Gray-white differentiation is otherwise preserved. Mild central pattern of volume loss. No extra-axial collection. Basilar cisterns are patent. Vascular: No hyperdense vessel or unexpected calcification. Skull: No calvarial fracture or suspicious bone lesion. Skull base is unremarkable. Sinuses/Orbits: Unremarkable. Other: None. IMPRESSION: 1. No acute intracranial abnormality. 2. Mild chronic small-vessel disease. Mild central pattern of volume loss. Electronically Signed   By: Emmit Alexanders M.D.   On: 03/26/2022 16:30    Procedures Procedures    Medications Ordered in ED Medications  LORazepam (ATIVAN) tablet 0.5 mg (has no administration in time range)    ED Course/ Medical Decision Making/ A&P                             Medical Decision Making Amount and/or Complexity of Data Reviewed Radiology: ordered.  Risk Prescription drug management. Decision regarding hospitalization.   72 year old patient with history of CAD, hypertension, hyperlipidemia comes in with chief complaint of strokelike symptoms.  Last known normal is actually sometime in end of December. More recently however patient has had worsening of symptoms, in the last 3 days he has noticed increased slurring of his speech.  Differential diagnosis for this patient includes Stroke - ischemic vs. hemorrhagic TIA Neuropathy Myelitis Electrolyte abnormality Neuropathy Muscular disease  I discussed the case with neurology, Dr. Kathrynn Speed.  We discussed that even if the MRI is negative, this patient most likely needs admission to the hospital for thorough workup for TIA.  Plan is to order CT  angiogram, MRI of the brain and admit the patient to the hospital.  Patient and the family have been made aware of this plan.  Final Clinical Impression(s) / ED Diagnoses Final diagnoses:  Slurred speech  Facial droop    Rx / DC Orders ED Discharge Orders     None         Varney Biles, MD 03/26/22 1814

## 2022-03-26 NOTE — Assessment & Plan Note (Signed)
Renal function stable, continue to monitor. 

## 2022-03-27 ENCOUNTER — Observation Stay (HOSPITAL_COMMUNITY): Payer: Medicare HMO

## 2022-03-27 ENCOUNTER — Observation Stay (HOSPITAL_BASED_OUTPATIENT_CLINIC_OR_DEPARTMENT_OTHER): Payer: Medicare HMO

## 2022-03-27 DIAGNOSIS — R29810 Facial weakness: Secondary | ICD-10-CM | POA: Diagnosis not present

## 2022-03-27 DIAGNOSIS — R4781 Slurred speech: Secondary | ICD-10-CM | POA: Diagnosis not present

## 2022-03-27 DIAGNOSIS — G912 (Idiopathic) normal pressure hydrocephalus: Secondary | ICD-10-CM | POA: Diagnosis not present

## 2022-03-27 DIAGNOSIS — G459 Transient cerebral ischemic attack, unspecified: Secondary | ICD-10-CM

## 2022-03-27 DIAGNOSIS — I639 Cerebral infarction, unspecified: Secondary | ICD-10-CM | POA: Diagnosis not present

## 2022-03-27 DIAGNOSIS — I10 Essential (primary) hypertension: Secondary | ICD-10-CM

## 2022-03-27 DIAGNOSIS — I480 Paroxysmal atrial fibrillation: Secondary | ICD-10-CM

## 2022-03-27 LAB — LIPID PANEL
Cholesterol: 108 mg/dL (ref 0–200)
HDL: 30 mg/dL — ABNORMAL LOW (ref >40)
LDL Cholesterol: 48 mg/dL (ref 0–99)
Total CHOL/HDL Ratio: 3.6 RATIO
Triglycerides: 148 mg/dL (ref <150)
VLDL: 30 mg/dL (ref 0–40)

## 2022-03-27 LAB — CBC
HCT: 41.3 % (ref 39.0–52.0)
Hemoglobin: 13.6 g/dL (ref 13.0–17.0)
MCH: 30.6 pg (ref 26.0–34.0)
MCHC: 32.9 g/dL (ref 30.0–36.0)
MCV: 92.8 fL (ref 80.0–100.0)
Platelets: 139 10*3/uL — ABNORMAL LOW (ref 150–400)
RBC: 4.45 MIL/uL (ref 4.22–5.81)
RDW: 13.1 % (ref 11.5–15.5)
WBC: 8.7 10*3/uL (ref 4.0–10.5)
nRBC: 0 % (ref 0.0–0.2)

## 2022-03-27 LAB — ECHOCARDIOGRAM COMPLETE
Area-P 1/2: 3.85 cm2
Height: 70 in
S' Lateral: 4.2 cm
Single Plane A2C EF: 44.4 %
Weight: 4377.45 oz

## 2022-03-27 LAB — BASIC METABOLIC PANEL
Anion gap: 12 (ref 5–15)
BUN: 18 mg/dL (ref 8–23)
CO2: 24 mmol/L (ref 22–32)
Calcium: 8.8 mg/dL — ABNORMAL LOW (ref 8.9–10.3)
Chloride: 102 mmol/L (ref 98–111)
Creatinine, Ser: 1.44 mg/dL — ABNORMAL HIGH (ref 0.61–1.24)
GFR, Estimated: 51 mL/min — ABNORMAL LOW (ref >60)
Glucose, Bld: 104 mg/dL — ABNORMAL HIGH (ref 70–99)
Potassium: 3.7 mmol/L (ref 3.5–5.1)
Sodium: 138 mmol/L (ref 135–145)

## 2022-03-27 LAB — HEMOGLOBIN A1C
Hgb A1c MFr Bld: 5.7 % — ABNORMAL HIGH (ref 4.8–5.6)
Mean Plasma Glucose: 116.89 mg/dL

## 2022-03-27 NOTE — Progress Notes (Signed)
[facility] PROGRESS NOTE   William Matthews [id number] DOB: 11-30-1950 DOA: 03/26/2022  PCP: Patient, No Pcp Per  Brief History/Interval Summary: 72 y.o. male with medical history significant for PAF on Eliquis, CAD s/p CABG and DES, CHF, CKD stage IIIa, HTN, HLD, hypothyroidism, BPH who presented to the ED for evaluation of left facial droop and slurred speech. Patient states he initially noted left-sided facial droop beginning at the end of December associated with slurred speech.  These changes resolved the next day.  Since then he has had intermittent episodes of dizziness without syncope.  Patient was hospitalized for further management.  Consultants: Neurology  Procedures: Echocardiogram has been ordered    Subjective/Interval History: Patient mentions that he does not have any speech difficulties this morning.  The weakness on the left side appears to have improved.  Denies any chest pain or shortness of breath.    Assessment/Plan:  Concern for acute stroke Patient presented with facial droop and slurred speech.  MRI negative for acute stroke.  Neurology has been consulted.  Patient could still have MRI negative for stroke.  Stroke code neurology team to see patient today. Echocardiogram has been ordered and is pending. LDL is 48.  Patient is on atorvastatin. HbA1c is 5.7 PT and OT evaluation is pending. Patient is on aspirin.  Eliquis being continued.  Paroxysmal atrial fibrillation On amiodarone carvedilol and Eliquis.  Currently in sinus rhythm.  Chronic systolic CHF EF was 40 to 45% in 2022.  Repeat echocardiogram is pending. Furosemide carvedilol and losartan being continued. Seems to be euvolemic.  Coronary artery disease He is status post CABG and drug-eluting stents. Stable.  Continue cardiac medications.  Essential hypertension Home medications being continued.  Due to concern for stroke we will discontinue the ARB for now.  Due to his other  comorbidities he needs the beta-blocker amiodarone and furosemide.  Chronic kidney disease stage IIIa Renal function stable.  Monitor urine output.  Avoid nephrotoxic agents.  Hyperlipidemia Continue atorvastatin.  LDL is 48.  Obesity Estimated body mass index is 39.26 kg/m as calculated from the following:   Height as of this encounter: 5' 10"$  (1.778 m).   Weight as of this encounter: 124.1 kg.  DVT Prophylaxis: On Eliquis Code Status: Full code Family Communication: Discussed with patient.  No family at bedside Disposition Plan: To be determined  Status is: Observation The patient remains OBS appropriate and may d/c before 2 midnights.      Medications: Scheduled:   stroke: early stages of recovery book   Does not apply Once   amiodarone  100 mg Oral Daily   apixaban  5 mg Oral BID   aspirin EC  81 mg Oral Daily   atorvastatin  20 mg Oral QHS   carvedilol  3.125 mg Oral BID WC   furosemide  40 mg Oral Daily   hydrALAZINE  25 mg Oral BID   losartan  100 mg Oral Daily   pantoprazole  80 mg Oral Q1200   Continuous: [id number] **OR** acetaminophen (TYLENOL) oral liquid 160 mg/5 mL **OR** acetaminophen, LORazepam, senna-docusate  Antibiotics: Anti-infectives (From admission, onward)    None       Objective:  Vital Signs  Vitals:   03/26/22 2300 03/26/22 2332 03/27/22 0433 03/27/22 0734  BP:  (!) 161/96 (!) 149/85 124/74  Pulse:  80 71 68  Resp:  18 16 16  $ Temp:  97.8 F (36.6 C) (!) 97.5 F (36.4 C) 97.9 F (36.6 C)  TempSrc:  Oral Oral   SpO2:  94% 98% 96%  Weight: 124.1 kg     Height:        Intake/Output Summary (Last 24 hours) at 03/27/2022 0943 Last data filed at 03/27/2022 0300 Gross per 24 hour  Intake --  Output 600 ml  Net -600 ml   Filed Weights   03/26/22 2300  Weight: 124.1 kg    General appearance: Awake alert.  In no distress Resp: Clear to auscultation bilaterally.  Normal effort Cardio: S1-S2 is normal regular.  No  S3-S4.  No rubs murmurs or bruit GI: Abdomen is soft.  Nontender nondistended.  Bowel sounds are present normal.  No masses organomegaly Extremities: No edema.  Full range of motion of lower extremities. Neurologic: No obvious facial asymmetry noted this morning.  Tongue is midline.  Subtle weakness of the left lower extremity is noted.   Lab Results:  Data Reviewed: I have personally reviewed following labs and reports of the imaging studies  CBC: Recent Labs  Lab 03/26/22 1450 03/27/22 0644  WBC 8.7 8.7  NEUTROABS 5.5  --   HGB 15.2 13.6  HCT 44.6 41.3  MCV 91.4 92.8  PLT 178 139*    Basic Metabolic Panel: Recent Labs  Lab 03/26/22 1450 03/27/22 0644  NA 136 138  K 3.8 3.7  CL 103 102  CO2 24 24  GLUCOSE 110* 104*  BUN 16 18  CREATININE 1.38* 1.44*  CALCIUM 9.1 8.8*    GFR: Estimated Creatinine Clearance: 60.4 mL/min (A) (by C-G formula based on SCr of 1.44 mg/dL (H)).   Coagulation Profile: Recent Labs  Lab 03/26/22 1450  INR 1.2     HbA1C: Recent Labs    03/27/22 0644  HGBA1C 5.7*    CBG: Recent Labs  Lab 03/26/22 1605  GLUCAP 117*    Lipid Profile: Recent Labs    03/27/22 0644  CHOL 108  HDL 30*  LDLCALC 48  TRIG 148  CHOLHDL 3.6     Radiology Studies: MR BRAIN WO CONTRAST  Result Date: 03/27/2022 CLINICAL DATA:  Left facial droop, slurred speech, stroke suspected EXAM: MRI HEAD WITHOUT CONTRAST TECHNIQUE: Multiplanar, multiecho pulse sequences of the brain and surrounding structures were obtained without intravenous contrast. COMPARISON:  No prior MRI available, correlation is made with CT head 03/26/2022 FINDINGS: Brain: No restricted diffusion to suggest acute or subacute infarct. No acute hemorrhage, mass, mass effect, or midline shift. No extra-axial collection. Normal pituitary and craniocervical junction. No hemosiderin deposition to suggest remote hemorrhage. T2 hyperintense signal in the periventricular white matter, likely  the sequela of moderate chronic small vessel ischemic disease. Ventriculomegaly, with crowding of the sulci near the vertex and an acute callosum angle. Vascular: Normal arterial flow voids. Skull and upper cervical spine: Normal marrow signal. Sinuses/Orbits: Clear paranasal sinuses. No acute finding in the orbits. Other: The mastoid air cells are well aerated. IMPRESSION: 1. No acute intracranial process. No evidence of acute or subacute infarct. 2. Ventriculomegaly, with crowding of the sulci near the vertex and an acute callosum angle, which can be seen in the setting of normal pressure hydrocephalus. Electronically Signed   By: Merilyn Baba M.D.   On: 03/27/2022 03:52   CT ANGIO HEAD NECK W WO CM  Result Date: 03/26/2022 CLINICAL DATA:  Stroke suspected, slurred speech, facial droop, dizziness EXAM: CT ANGIOGRAPHY HEAD AND NECK TECHNIQUE: Multidetector CT imaging of the head and neck was performed using the standard protocol during bolus administration of intravenous  contrast. Multiplanar CT image reconstructions and MIPs were obtained to evaluate the vascular anatomy. Carotid stenosis measurements (when applicable) are obtained utilizing NASCET criteria, using the distal internal carotid diameter as the denominator. RADIATION DOSE REDUCTION: This exam was performed according to the departmental dose-optimization program which includes automated exposure control, adjustment of the mA and/or kV according to patient size and/or use of iterative reconstruction technique. CONTRAST:  110m OMNIPAQUE IOHEXOL 350 MG/ML SOLN COMPARISON:  No prior CTA, correlation is made with CT head 03/26/2022 FINDINGS: CT HEAD FINDINGS Brain: No evidence of acute infarct, hemorrhage, mass, mass effect, or midline shift. No hydrocephalus or extra-axial fluid collection. Vascular: No definite hyperdense vessel, but there is significant calcification in the distal right vertebral artery (series 6, image 7). Skull: Negative for  fracture or focal lesion. Sinuses/Orbits: Small mucous retention cyst in the left maxillary sinus. No acute finding in the orbits. Other: The mastoid air cells are well aerated. CTA NECK FINDINGS Aortic arch: Standard branching. Imaged portion shows no evidence of aneurysm or dissection. No significant stenosis of the major arch vessel origins. Aortic atherosclerosis. Right carotid system: 60% stenosis in the proximal right ICA, secondary to calcified and noncalcified plaque (series 10, image 154). No evidence of dissection or occlusion Left carotid system: No evidence of dissection, occlusion, or hemodynamically significant stenosis (greater than 50%). Vertebral arteries: Nonopacification of the right V1 segment, with minimal reconstitution in the proximal V2 segment (series 10, image 123), and more robust reconstitution in the distal V2 segment (series 10, image 170). The V3 segment is patent. The left vertebral artery is patent from its origin to the skull base. No evidence of dissection or occlusion in the left vertebral artery. Skeleton: No acute osseous abnormality. Degenerative changes in the cervical spine. Prior median sternotomy. Poor dentition, with multifocal periapical lucencies and dental caries. Other neck: No acute finding. Upper chest: No focal pulmonary opacity or pleural effusion. Review of the MIP images confirms the above findings CTA HEAD FINDINGS Anterior circulation: Both internal carotid arteries are patent to the termini, with calcifications but without significant stenosis. A1 segments patent. Normal anterior communicating artery. Severe stenosis in an A3 branch (series 10, image 302). Anterior cerebral arteries are otherwise patent to their distal aspects. No M1 stenosis or occlusion. Multifocal irregularity with no more than mild focal stenosis. MCA branches perfused to their distal aspects. Posterior circulation: The left vertebral artery is patent to the vertebrobasilar junction  without significant stenosis. Poor opacification of the right V4, with short segment occlusion distally (series 10, image 228-233), with flow at the vertebrobasilar junction, likely retrograde. The left PICA is patent. The right PICA is poorly opacified but appears patent. Basilar patent to its distal aspect. Superior cerebellar arteries patent proximally. Patent P1 segments. PCAs perfused to their distal aspects with multifocal irregularity but without stenosis. The bilateral posterior communicating arteries are not visualized. Venous sinuses: As permitted by contrast timing, patent. Anatomic variants: None significant. Review of the MIP images confirms the above findings IMPRESSION: 1. No acute intracranial process. 2. Nonopacification of the right V1 segment, with minimal reconstitution in the proximal V2 segment, more robust reconstitution in the distal V2 segment, and short segment occlusion of the distal V4 segment. The right PICA is poorly opacified but appears patent. 3. 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 No other hemodynamically significant stenosis in the neck. 4. Severe stenosis in a A3 branch of the left anterior cerebral artery. Other intracranial vasculature is patent but irregular. 5. Aortic atherosclerosis.  Aortic Atherosclerosis (ICD10-I70.0). These results were called by telephone at the time of interpretation on 03/26/2022 at 10:26 pm to provider [facility] , who verbally acknowledged these results. Electronically Signed   By: Merilyn Baba M.D.   On: 03/26/2022 22:26   CT Head Wo Contrast  Result Date: 03/26/2022 CLINICAL DATA:  Transient ischemic attack. EXAM: CT HEAD WITHOUT CONTRAST TECHNIQUE: Contiguous axial images were obtained from the base of the skull through the vertex without intravenous contrast. RADIATION DOSE REDUCTION: This exam was performed according to the departmental dose-optimization program which includes automated exposure control, adjustment of the mA and/or kV  according to patient size and/or use of iterative reconstruction technique. COMPARISON:  Head CT 09/02/2012. FINDINGS: Brain: No acute intracranial hemorrhage. Mild chronic small-vessel disease. Gray-white differentiation is otherwise preserved. Mild central pattern of volume loss. No extra-axial collection. Basilar cisterns are patent. Vascular: No hyperdense vessel or unexpected calcification. Skull: No calvarial fracture or suspicious bone lesion. Skull base is unremarkable. Sinuses/Orbits: Unremarkable. Other: None. IMPRESSION: 1. No acute intracranial abnormality. 2. Mild chronic small-vessel disease. Mild central pattern of volume loss. Electronically Signed   By: Emmit Alexanders M.D.   On: 03/26/2022 16:30       LOS: 0 days   [facility] Pager on [email or web address]  03/27/2022, 9:43 AM

## 2022-03-27 NOTE — Progress Notes (Signed)
Carotid duplex bilateral attempted. Per transport, patient declines to come down at this time. Will attempt again as schedule and patient availability permits.   Darlin Coco, RDMS, RVT

## 2022-03-27 NOTE — Evaluation (Signed)
Occupational Therapy Evaluation Patient Details Name: William Matthews MRN: [id number] DOB: [birth year] Today's Date: 03/27/2022   History of Present Illness 72 yo male presenting with L facial droop and slurred speech MRI (+) Ventriculomegaly PMH atrial fibrillation on eliquis, CAD s/p CABG CHF HTN HLD Hypothyroidism CKDII MI   Clinical Impression   PT admitted with L side weakness. Pt currently with functional limitiations due to the deficits listed below (see OT problem list). Pt reports baseline indep and driving. Pt lives with children. Pt demonstrates L eye droop compared to right this session. Pt needs redirection throughout session and demonstrates poor awareness to deficits. Pt reports "I am on 29 heading [place] right now" when asked where he is currently.  Pt will benefit from skilled OT to increase their independence and safety with adls and balance to allow discharge outpatient OT for cognition follow up.       Recommendations for follow up therapy are one component of a multi-disciplinary discharge planning process, led by the attending physician.  Recommendations may be updated based on patient status, additional functional criteria and insurance authorization.   Follow Up Recommendations  Outpatient OT     Assistance Recommended at Discharge PRN  Patient can return home with the following Assist for transportation    Functional Status Assessment  Patient has had a recent decline in their functional status and demonstrates the ability to make significant improvements in function in a reasonable and predictable amount of time.  Equipment Recommendations  None recommended by OT    Recommendations for Other Services       Precautions / Restrictions Precautions Precautions: Fall Restrictions Weight Bearing Restrictions: No      Mobility Bed Mobility Overal bed mobility: Independent                  Transfers Overall transfer level: Independent                         Balance Overall balance assessment: Mild deficits observed, not formally tested                                         ADL either performed or assessed with clinical judgement   ADL Overall ADL's : Needs assistance/impaired Eating/Feeding: Independent   Grooming: Wash/dry face;Modified independent;Standing   Upper Body Bathing: Supervision/ safety   Lower Body Bathing: Supervison/ safety   Upper Body Dressing : Supervision/safety   Lower Body Dressing: Supervision/safety   Toilet Transfer: Supervision/safety           Functional mobility during ADLs: Supervision/safety General ADL Comments: pt demonstrates making the bed, bed mobility, basic transfer, house hold ambulation transfer with decreased gait velocity, pt needs redirection in session away from therapist and back on to the reason for admission. pt able to read menu and order breakfast. pt educated on heart healthy diet by dietary team when ordering so pt able to make choice based on the options correctly     Vision Baseline Vision/History: 1 Wears glasses Vision Assessment?: Yes Eye Alignment: Within Functional Limits Ocular Range of Motion: Within Functional Limits Alignment/Gaze Preference: Within Defined Limits Tracking/Visual Pursuits: Decreased smoothness of horizontal tracking;Decreased smoothness of vertical tracking;Unable to hold eye position out of midline;Impaired - to be further tested in functional context Additional Comments: L eye lid slightly more closed than R eye. unable to sustain  attention to task. pt continues to stare at therapists with cues to continue to track object. pt with poor attention to task but [facility] for all quadrants for ocular ROM     Perception     Praxis      Pertinent Vitals/Pain Pain Assessment Pain Assessment: No/denies pain     Hand Dominance Right   Extremity/Trunk Assessment Upper Extremity Assessment Upper Extremity Assessment:  Overall WFL for tasks assessed   Lower Extremity Assessment Lower Extremity Assessment: Overall WFL for tasks assessed (pt stands with widen base of support and suspect this could be baseline. pt reports no differences when asked)   Cervical / Trunk Assessment Cervical / Trunk Assessment: Normal   Communication Communication Communication: No difficulties   Cognition Arousal/Alertness: Awake/alert Behavior During Therapy: WFL for tasks assessed/performed Overall Cognitive Status: Impaired/Different from baseline Area of Impairment: Orientation, Memory, Attention, Safety/judgement, Awareness, Problem solving                 Orientation Level: Disoriented to, Time, Place Current Attention Level: Selective Memory: Decreased short-term memory   Safety/Judgement: Decreased awareness of safety, Decreased awareness of deficits Awareness: Intellectual Problem Solving: Slow processing General Comments: reports " I am currently on 29 now heading [place] bound" . The phone rings during the session pt states "its a bill collector" pt then answers the phone and calls the caller by name. pt is able to tell him that he is at [facility] and that they think he has had a stroke showing orientation to place and reason for admission. Pt able to complete math problem during session     General Comments       Exercises     Shoulder Instructions      Home Living Family/patient expects to be discharged to:: Private residence Living Arrangements: Children Available Help at Discharge: Family Type of Home: House Home Access: Stairs to enter Technical brewer of Steps: 3 Entrance Stairs-Rails: Right;Left Home Layout: One level     Bathroom Shower/Tub: Occupational psychologist: Handicapped height Bathroom Accessibility: Yes How Accessible: Accessible via walker [place]: Conservation officer, nature (2 wheels);Cane - single point   Additional Comments: no family present but pt reports  information above.      Prior Functioning/Environment Prior Level of Function : Independent/Modified Independent;Driving                        OT Problem List: Impaired balance (sitting and/or standing);Decreased cognition;Obesity      OT Treatment/Interventions: Self-care/ADL training;Energy conservation;Cognitive remediation/compensation;Balance training;Patient/family education    OT Goals(Current goals can be found in the care plan section) Acute Rehab OT Goals Patient Stated Goal: to go home today. pt also expressed he had DME at this house but he isnt using any of it. OT Goal Formulation: Patient unable to participate in goal setting Time For Goal Achievement: 04/10/22 Potential to Achieve Goals: Good  OT Frequency: Min 2X/week    Co-evaluation              AM-PAC OT "6 Clicks" Daily Activity     Outcome Measure Help from another person eating meals?: None Help from another person taking care of personal grooming?: None Help from another person toileting, which includes using toliet, bedpan, or urinal?: A Little Help from another person bathing (including washing, rinsing, drying)?: A Little Help from another person to put on and taking off regular upper body clothing?: None Help from another person to put on and  taking off regular lower body clothing?: A Little 6 Click Score: 21   End of Session Nurse Communication: Mobility status  Activity Tolerance: Patient tolerated treatment well Patient left: in bed;with call bell/phone within reach;with bed alarm set;Other (comment) (echo arriving to complete scan and assuming pt care at this time)  OT Visit Diagnosis: Unsteadiness on feet (R26.81)                Time: [id number] OT Time Calculation (min): 22 min Charges:  OT General Charges $OT Visit: 1 Visit OT Evaluation $OT Eval Moderate Complexity: 1 Mod   William Matthews, OTR/L  Acute Rehabilitation Services Office: [phone] .   Jeri Modena 03/27/2022,  9:58 AM

## 2022-03-27 NOTE — TOC Initial Note (Addendum)
Transition of Care [facility]) - Initial/Assessment Note    Patient Details  Name: William Matthews MRN: [id number] Date of Birth: 1951-01-12  Transition [facility]) CM/SW Contact:    Ninfa Meeker, RN Phone Number: 03/27/2022, 3:04 PM  Clinical Narrative:                 Transition of care screening note:  Patient is a 72 yr old male, admitted with slurred speech, facial droop. 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  Transition of Care [facility]) Department has reviewed patient and no TOC needs have been identified at this time. We will continue to monitor patient advancement through Interdisciplinary progressions and if new patient needs arise, please place a consult.          Patient Goals and CMS Choice            Expected Discharge Plan and Services                                              Prior Living Arrangements/Services                       Activities of Daily Living Home Assistive Devices/Equipment: None ADL Screening (condition at time of admission) Patient's cognitive ability adequate to safely complete daily activities?: Yes Is the patient deaf or have difficulty hearing?: No Does the patient have difficulty seeing, even when wearing glasses/contacts?: No Does the patient have difficulty concentrating, remembering, or making decisions?: No Patient able to express need for assistance with ADLs?: Yes Does the patient have difficulty dressing or bathing?: No Independently performs ADLs?: Yes (appropriate for developmental age) Does the patient have difficulty walking or climbing stairs?: Yes Weakness of Legs: Both Weakness of Arms/Hands: Both  Permission Sought/Granted                  Emotional Assessment              Admission diagnosis:  Slurred speech [R47.81] Facial droop [R29.810] Patient Active Problem List   Diagnosis Date Noted   Slurred speech 03/26/2022   Chronic kidney disease, stage 3a ([place])  03/26/2022   Hypothyroidism 03/26/2022   Paroxysmal atrial fibrillation ([place]) 03/26/2022   Heart failure with mildly reduced ejection fraction (HFmrEF) ([place]) 03/26/2022   Pain due to onychomycosis of toenails of both feet 08/23/2020   BPH with obstruction/lower urinary tract symptoms 12/18/2019   Anticoagulant long-term use 09/07/2019   Acute on chronic systolic congestive heart failure ([place]) 09/07/2019   Mitral regurgitation 09/07/2019   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   Atrial flutter ([place]) 08/15/2019   Unstable angina ([place]) 10/03/2015   NSTEMI (non-ST elevated myocardial infarction) ([place]) 02/20/2015   Hyperlipidemia    Prediabetes    Obesity    ED (erectile dysfunction)    CAD (coronary artery disease)    CKD (chronic kidney disease), stage II 02/18/2015   Nonspecific abnormal unspecified cardiovascular function study 08/11/2010   Unstable angina pectoris ([place]) 07/02/2010   Essential hypertension 07/02/2010   Erectile dysfunction 07/02/2010   PCP:  Patient, No Pcp Per Pharmacy:   [facility] [place] - [address] [place] [address] Phone: [phone] Fax: [place] [address], [address]  [place] [address] Phone: [phone] Fax: [phone]  [id number] [place] [address] [place]: [phone]: [phone]    Social Determinants of Health (SDOH) Social History: SDOH Screenings   Food Insecurity: No Food Insecurity (03/26/2022)  Housing: Low Risk  (03/26/2022)  Transportation Needs: No Transportation Needs (03/26/2022)  Utilities: Not At Risk (03/26/2022)  Tobacco Use: Low Risk  (03/26/2022)   SDOH Interventions:     Readmission Risk Interventions     No data to display

## 2022-03-27 NOTE — Progress Notes (Signed)
Stroke education given. Specifically BEFAST, and 911 activation. Discussed individual risk factors for stroke including AF.

## 2022-03-27 NOTE — Progress Notes (Signed)
Pt left per bed for MRI. Pt left in stable condition

## 2022-03-27 NOTE — Progress Notes (Signed)
  Echocardiogram 2D Echocardiogram has been performed.  William Matthews 03/27/2022, 10:19 AM

## 2022-03-27 NOTE — Plan of Care (Signed)

## 2022-03-27 NOTE — Plan of Care (Signed)
  Problem: Education: Goal: Knowledge of General Education information will improve Description: Including pain rating scale, medication(s)/side effects and non-pharmacologic comfort measures Outcome: Progressing   Problem: Health Behavior/Discharge Planning: Goal: Ability to manage health-related needs will improve Outcome: Progressing   Problem: Clinical Measurements: Goal: Ability to maintain clinical measurements within normal limits will improve Outcome: Progressing Goal: Will remain free from infection Outcome: Progressing Goal: Respiratory complications will improve Outcome: Progressing   Problem: Activity: Goal: Risk for activity intolerance will decrease Outcome: Progressing   Problem: Elimination: Goal: Will not experience complications related to bowel motility Outcome: Progressing   Problem: Pain Managment: Goal: General experience of comfort will improve Outcome: Progressing   Problem: Safety: Goal: Ability to remain free from injury will improve Outcome: Progressing   Problem: Skin Integrity: Goal: Risk for impaired skin integrity will decrease Outcome: Progressing   Problem: Education: Goal: Knowledge of disease or condition will improve Outcome: Progressing   Problem: Ischemic Stroke/TIA Tissue Perfusion: Goal: Complications of ischemic stroke/TIA will be minimized Outcome: Progressing

## 2022-03-27 NOTE — Progress Notes (Addendum)
STROKE TEAM PROGRESS NOTE   INTERVAL HISTORY Patient is seen in his room with no family at the bedside.  Over the 3 days prior to admission, he noted slurring of his speech and left facial droop.  The symptoms have been intermittent for about a month.  MRI was negative for stroke, but on CTA, patient was noted to have 60% right ICA stenosis.  Vitals:   03/26/22 2300 03/26/22 2332 03/27/22 0433 03/27/22 0734  BP:  (!) 161/96 (!) 149/85 124/74  Pulse:  80 71 68  Resp:  18 16 16  $ Temp:  97.8 F (36.6 C) (!) 97.5 F (36.4 C) 97.9 F (36.6 C)  TempSrc:  Oral Oral   SpO2:  94% 98% 96%  Weight: 124.1 kg     Height:       CBC:  Recent Labs  Lab 03/26/22 1450 03/27/22 0644  WBC 8.7 8.7  NEUTROABS 5.5  --   HGB 15.2 13.6  HCT 44.6 41.3  MCV 91.4 92.8  PLT 178 [id number]*   Basic Metabolic Panel:  Recent Labs  Lab 03/26/22 1450 03/27/22 0644  NA 136 138  K 3.8 3.7  CL 103 102  CO2 24 24  GLUCOSE 110* 104*  BUN 16 18  CREATININE 1.38* 1.44*  CALCIUM 9.1 8.8*   Lipid Panel:  Recent Labs  Lab 03/27/22 0644  CHOL 108  TRIG 148  HDL 30*  CHOLHDL 3.6  VLDL 30  LDLCALC 48   HgbA1c:  Recent Labs  Lab 03/27/22 0644  HGBA1C 5.7*   Urine Drug Screen: No results for input(s): "LABOPIA", "COCAINSCRNUR", "LABBENZ", "AMPHETMU", "THCU", "LABBARB" in the last 168 hours.  Alcohol Level No results for input(s): "ETH" in the last 168 hours.  IMAGING past 24 hours ECHOCARDIOGRAM COMPLETE  Result Date: 03/27/2022    ECHOCARDIOGRAM REPORT   Patient Name:   IVA BANDUCCI Meyering Date of Exam: 03/27/2022 Medical Rec #:  [id number]      Height:       70.0 in Accession #:    [id number]     Weight:       273.6 lb Date of Birth:  09/25/1950       BSA:          2.385 m Patient Age:    72 years       BP:           124/74 mmHg Patient Gender: M              HR:           73 bpm. Exam Location:  Inpatient Procedure: 2D Echo, Cardiac Doppler and Color Doppler Indications:    TIA  History:        Patient has  prior history of Echocardiogram examinations, most                 recent 01/08/2016. CAD, Chronic kidney disease,                 Arrythmias:Paroxysmal A-Fib; Risk Factors:Hypertension and                 Dyslipidemia.  Sonographer:    Johny Chess RDCS Referring Phys: [id number] VISHAL R PATEL  Sonographer Comments: Image acquisition challenging due to patient body habitus. IMPRESSIONS  1. Left ventricular ejection fraction, by estimation, is 50 to 55%. The left ventricle has low normal function. The left ventricle has no regional wall motion abnormalities. There is moderate left ventricular hypertrophy. Left ventricular  diastolic parameters were normal.  2. Right ventricular systolic function is normal. The right ventricular size is normal. There is normal pulmonary artery systolic pressure.  3. The mitral valve is normal in structure. Mild mitral valve regurgitation. No evidence of mitral stenosis.  4. The aortic valve is normal in structure. Aortic valve regurgitation is mild. No aortic stenosis is present.  5. The inferior vena cava is normal in size with greater than 50% respiratory variability, suggesting right atrial pressure of 3 mmHg. Conclusion(s)/Recommendation(s): No intracardiac source of embolism detected on this transthoracic study. Consider a transesophageal echocardiogram to exclude cardiac source of embolism if clinically indicated. FINDINGS  Left Ventricle: Left ventricular ejection fraction, by estimation, is 50 to 55%. The left ventricle has low normal function. The left ventricle has no regional wall motion abnormalities. The left ventricular internal cavity size was normal in size. There is moderate left ventricular hypertrophy. Left ventricular diastolic parameters were normal. Right Ventricle: The right ventricular size is normal. No increase in right ventricular wall thickness. Right ventricular systolic function is normal. There is normal pulmonary artery systolic pressure. The  tricuspid regurgitant velocity is 2.13 m/s, and  with an assumed right atrial pressure of 3 mmHg, the estimated right ventricular systolic pressure is 123456 mmHg. Left Atrium: Left atrial size was normal in size. Right Atrium: Right atrial size was normal in size. Pericardium: There is no evidence of pericardial effusion. Mitral Valve: The mitral valve is normal in structure. Mild mitral valve regurgitation. No evidence of mitral valve stenosis. Tricuspid Valve: The tricuspid valve is normal in structure. Tricuspid valve regurgitation is trivial. No evidence of tricuspid stenosis. Aortic Valve: The aortic valve is normal in structure. Aortic valve regurgitation is mild. No aortic stenosis is present. Pulmonic Valve: The pulmonic valve was normal in structure. Pulmonic valve regurgitation is not visualized. No evidence of pulmonic stenosis. Aorta: The aortic root is normal in size and structure. Venous: The inferior vena cava is normal in size with greater than 50% respiratory variability, suggesting right atrial pressure of 3 mmHg. IAS/Shunts: No atrial level shunt detected by color flow Doppler.  LEFT VENTRICLE PLAX 2D LVIDd:         5.30 cm      Diastology LVIDs:         4.20 cm      LV e' medial:    5.66 cm/s LV PW:         1.40 cm      LV E/e' medial:  16.0 LV IVS:        1.40 cm      LV e' lateral:   10.30 cm/s LVOT diam:     2.00 cm      LV E/e' lateral: 8.8 LV SV:         63 LV SV Index:   27 LVOT Area:     3.14 cm  LV Volumes (MOD) LV vol d, MOD A2C: 111.0 ml LV vol s, MOD A2C: 61.7 ml LV SV MOD A2C:     49.3 ml RIGHT VENTRICLE             IVC RV Basal diam:  2.90 cm     IVC diam: 1.90 cm RV S prime:     11.30 cm/s TAPSE (M-mode): 2.2 cm LEFT ATRIUM           Index        RIGHT ATRIUM           Index LA diam:  4.40 cm 1.85 cm/m   RA Area:     22.10 cm LA Vol (A2C): 50.0 ml 20.97 ml/m  RA Volume:   68.50 ml  28.73 ml/m LA Vol (A4C): 95.8 ml 40.17 ml/m  AORTIC VALVE LVOT Vmax:   103.00 cm/s LVOT  Vmean:  67.000 cm/s LVOT VTI:    0.202 m  AORTA Ao Root diam: 3.20 cm MITRAL VALVE               TRICUSPID VALVE MV Area (PHT): 3.85 cm    TR Peak grad:   18.1 mmHg MV Decel Time: 197 msec    TR Vmax:        213.00 cm/s MV E velocity: 90.30 cm/s MV A velocity: 69.20 cm/s  SHUNTS MV E/A ratio:  1.30        Systemic VTI:  0.20 m                            Systemic Diam: 2.00 cm Candee Furbish MD Electronically signed by Candee Furbish MD Signature Date/Time: 03/27/2022/11:53:41 AM    Final    MR BRAIN WO CONTRAST  Result Date: 03/27/2022 CLINICAL DATA:  Left facial droop, slurred speech, stroke suspected EXAM: MRI HEAD WITHOUT CONTRAST TECHNIQUE: Multiplanar, multiecho pulse sequences of the brain and surrounding structures were obtained without intravenous contrast. COMPARISON:  No prior MRI available, correlation is made with CT head 03/26/2022 FINDINGS: Brain: No restricted diffusion to suggest acute or subacute infarct. No acute hemorrhage, mass, mass effect, or midline shift. No extra-axial collection. Normal pituitary and craniocervical junction. No hemosiderin deposition to suggest remote hemorrhage. T2 hyperintense signal in the periventricular white matter, likely the sequela of moderate chronic small vessel ischemic disease. Ventriculomegaly, with crowding of the sulci near the vertex and an acute callosum angle. Vascular: Normal arterial flow voids. Skull and upper cervical spine: Normal marrow signal. Sinuses/Orbits: Clear paranasal sinuses. No acute finding in the orbits. Other: The mastoid air cells are well aerated. IMPRESSION: 1. No acute intracranial process. No evidence of acute or subacute infarct. 2. Ventriculomegaly, with crowding of the sulci near the vertex and an acute callosum angle, which can be seen in the setting of normal pressure hydrocephalus. Electronically Signed   By: Merilyn Baba M.D.   On: 03/27/2022 03:52   CT ANGIO HEAD NECK W WO CM  Result Date: 03/26/2022 CLINICAL DATA:   Stroke suspected, slurred speech, facial droop, dizziness EXAM: CT ANGIOGRAPHY HEAD AND NECK TECHNIQUE: Multidetector CT imaging of the head and neck was performed using the standard protocol during bolus administration of intravenous contrast. Multiplanar CT image reconstructions and MIPs were obtained to evaluate the vascular anatomy. Carotid stenosis measurements (when applicable) are obtained utilizing NASCET criteria, using the distal internal carotid diameter as the denominator. RADIATION DOSE REDUCTION: This exam was performed according to the departmental dose-optimization program which includes automated exposure control, adjustment of the mA and/or kV according to patient size and/or use of iterative reconstruction technique. CONTRAST:  57m OMNIPAQUE IOHEXOL 350 MG/ML SOLN COMPARISON:  No prior CTA, correlation is made with CT head 03/26/2022 FINDINGS: CT HEAD FINDINGS Brain: No evidence of acute infarct, hemorrhage, mass, mass effect, or midline shift. No hydrocephalus or extra-axial fluid collection. Vascular: No definite hyperdense vessel, but there is significant calcification in the distal right vertebral artery (series 6, image 7). Skull: Negative for fracture or focal lesion. Sinuses/Orbits: Small mucous retention cyst in the left maxillary sinus. No  acute finding in the orbits. Other: The mastoid air cells are well aerated. CTA NECK FINDINGS Aortic arch: Standard branching. Imaged portion shows no evidence of aneurysm or dissection. No significant stenosis of the major arch vessel origins. Aortic atherosclerosis. Right carotid system: 60% stenosis in the proximal right ICA, secondary to calcified and noncalcified plaque (series 10, image 154). No evidence of dissection or occlusion Left carotid system: No evidence of dissection, occlusion, or hemodynamically significant stenosis (greater than 50%). Vertebral arteries: Nonopacification of the right V1 segment, with minimal reconstitution in the  proximal V2 segment (series 10, image 123), and more robust reconstitution in the distal V2 segment (series 10, image 170). The V3 segment is patent. The left vertebral artery is patent from its origin to the skull base. No evidence of dissection or occlusion in the left vertebral artery. Skeleton: No acute osseous abnormality. Degenerative changes in the cervical spine. Prior median sternotomy. Poor dentition, with multifocal periapical lucencies and dental caries. Other neck: No acute finding. Upper chest: No focal pulmonary opacity or pleural effusion. Review of the MIP images confirms the above findings CTA HEAD FINDINGS Anterior circulation: Both internal carotid arteries are patent to the termini, with calcifications but without significant stenosis. A1 segments patent. Normal anterior communicating artery. Severe stenosis in an A3 branch (series 10, image 302). Anterior cerebral arteries are otherwise patent to their distal aspects. No M1 stenosis or occlusion. Multifocal irregularity with no more than mild focal stenosis. MCA branches perfused to their distal aspects. Posterior circulation: The left vertebral artery is patent to the vertebrobasilar junction without significant stenosis. Poor opacification of the right V4, with short segment occlusion distally (series 10, image 228-233), with flow at the vertebrobasilar junction, likely retrograde. The left PICA is patent. The right PICA is poorly opacified but appears patent. Basilar patent to its distal aspect. Superior cerebellar arteries patent proximally. Patent P1 segments. PCAs perfused to their distal aspects with multifocal irregularity but without stenosis. The bilateral posterior communicating arteries are not visualized. Venous sinuses: As permitted by contrast timing, patent. Anatomic variants: None significant. Review of the MIP images confirms the above findings IMPRESSION: 1. No acute intracranial process. 2. Nonopacification of the right V1  segment, with minimal reconstitution in the proximal V2 segment, more robust reconstitution in the distal V2 segment, and short segment occlusion of the distal V4 segment. The right PICA is poorly opacified but appears patent. 3. 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 No other hemodynamically significant stenosis in the neck. 4. Severe stenosis in a A3 branch of the left anterior cerebral artery. Other intracranial vasculature is patent but irregular. 5. Aortic atherosclerosis. Aortic Atherosclerosis (ICD10-I70.0). These results were called by telephone at the time of interpretation on 03/26/2022 at 10:26 pm to provider [facility] , who verbally acknowledged these results. Electronically Signed   By: Merilyn Baba M.D.   On: 03/26/2022 22:26   CT Head Wo Contrast  Result Date: 03/26/2022 CLINICAL DATA:  Transient ischemic attack. EXAM: CT HEAD WITHOUT CONTRAST TECHNIQUE: Contiguous axial images were obtained from the base of the skull through the vertex without intravenous contrast. RADIATION DOSE REDUCTION: This exam was performed according to the departmental dose-optimization program which includes automated exposure control, adjustment of the mA and/or kV according to patient size and/or use of iterative reconstruction technique. COMPARISON:  Head CT 09/02/2012. FINDINGS: Brain: No acute intracranial hemorrhage. Mild chronic small-vessel disease. Gray-white differentiation is otherwise preserved. Mild central pattern of volume loss. No extra-axial collection. Basilar cisterns are  patent. Vascular: No hyperdense vessel or unexpected calcification. Skull: No calvarial fracture or suspicious bone lesion. Skull base is unremarkable. Sinuses/Orbits: Unremarkable. Other: None. IMPRESSION: 1. No acute intracranial abnormality. 2. Mild chronic small-vessel disease. Mild central pattern of volume loss. Electronically Signed   By: Emmit Alexanders M.D.   On: 03/26/2022 16:30    PHYSICAL EXAM General: Alert,  well-nourished, well-developed elderly patient in no acute distress Respiratory: Regular, unlabored respirations on room air  NEURO:  Mental Status: AA&Ox3, 3 out of 3 registration, 3 out of 3 recall, able to name 7 animals with 4 feet Speech/Language: speech is without dysarthria or aphasia.  Naming, repetition, fluency, and comprehension intact.  Cranial Nerves:  II: PERRL. Visual fields full.  III, IV, VI: EOMI. Eyelids elevate symmetrically.  V: Sensation is intact to light touch and symmetrical to face.  VII: Smile is symmetrical.   VIII: hearing intact to voice. IX, X: Phonation is normal.  [id number] shrug 5/5. XII: tongue is midline without fasciculations. Motor: 5/5 strength to all muscle groups tested.  Tone: is normal and bulk is normal Sensation- Intact to light touch bilaterally.  Coordination: FTN intact bilaterally.No drift.  Gait- deferred   ASSESSMENT/PLAN Mr. William Matthews is a 72 y.o. male with history of atrial fibrillation on Eliquis, CAD status post CABG, CHF, hypertension, hyperlipidemia and hypothyroidism presenting with slurring of his speech and left facial droop.  The symptoms have resolved.  The symptoms have been intermittent for about a month.  MRI was negative for stroke, but on CTA, patient was noted to have 60% right ICA stenosis.  Of note, patient may have missed an Eliquis dose recently.  Symptomatic right ICA stenosis causing intermittent left facial droop and slurred speech  CT head No acute abnormality. Small vessel disease. Atrophy.  CTA head & neck opacification of right V1 with minimal reconstitution and proximal V2, 60% stenosis of proximal right ICA, severe stenosis and A3 branch of left ACA MRI no acute abnormality, ventriculomegaly seen Carotid Doppler pending 2D Echo EF 50 to 55%, moderate LVH, normal left atrial size, no atrial level shunt LDL 48 HgbA1c 5.7 VTE prophylaxis -fully anticoagulated with Eliquis    Diet   Diet Heart  Room service appropriate? Yes; Fluid consistency: Thin   aspirin 81 mg daily and Eliquis (apixaban) daily prior to admission, now on aspirin 81 mg daily and Eliquis (apixaban) daily.  Therapy recommendations: Outpatient OT Disposition: Pending  Hypertension Home meds: Carvedilol 3.125 mg daily Stable Permissive hypertension (OK if < 220/120) but gradually normalize in 5-7 days Long-term BP goal normotensive  Hyperlipidemia Home meds: Atorvastatin 10 mg daily, increase to 20 mg daily LDL 48, goal < 70 High intensity statin not indicated as LDL below goal Continue statin at discharge   Other Stroke Risk Factors Advanced Age >/= 55  Obesity, Body mass index is 39.26 kg/m., BMI >/= 30 associated with increased stroke risk, recommend weight loss, diet and exercise as appropriate  Coronary artery disease Congestive heart failure  Other Active Problems None  Hospital day # [place] , MSN, AGACNP-BC [facility] See Amion for schedule and pager information 03/27/2022 1:01 PM   I have personally obtained history,examined this patient, reviewed notes, independently viewed imaging studies, participated in medical decision making and plan of care.ROS completed by me personally and pertinent positives fully documented  I have made any additions or clarifications directly to the above note. Agree with note above.  Patient presented with  recurrent episodes of transient slurred speech and left facial droop likely from symptomatic moderate proximal right carotid stenosis.  Recommend check carotid ultrasound and if moderate stenosis confirmed will recommend revascularization of the right carotid as an outpatient with vascular surgery,.  Continue aspirin and Eliquis for now.  Aggressive risk factor modification.  Discussed with patient and answered questions.  Discussed with Dr. Pricilla Handler.  Greater than 50% time during this 50-minute visit was spent in counseling and  coordination of care about his symptomatic carotid stenosis, TIA and A-fib and answering questions.Stroke team will sign off. Call for questions. Antony Contras, MD Medical Director [facility] Pager: [phone] 03/27/2022 1:38 PM  To contact Stroke Continuity provider, please refer to [email or web address] After hours, contact General Neurology

## 2022-03-27 NOTE — Consult Note (Signed)
NEUROLOGY CONSULTATION NOTE   Date of service: March 27, 2022 Patient Name: William Matthews MRN:  [id number] DOB:  1950/11/27 Reason for consult: "L facial droop and slurred speech" Requesting Provider: Lenore Cordia, MD _ _ _   _ __   _ __ _ _  __ __   _ __   __ _  History of Present Illness  William Matthews is a 72 y.o. male with PMH significant for paroxysmal atrial fibrillation on Eliquis, CAD status post CABG, CHF, hypertension, hyperlipidemia, hypothyroidism who presents with a left facial droop and slurred speech.  He had left facial droop in the end of December which spontaneously resolved the next day.  Over the last 3 days, has noted some slurring of his speech and recurrence of the left facial droop. Symptoms were persistent so he came to the ED for further evaluation and workup.  LKW: 03/24/22 mRS: 0 tNKASE: not offered, outside window Thrombectomy: not offered, outside window NIHSS components Score: Comment  1a Level of Conscious 0[x]$  1[]$  2[]$  3[]$      1b LOC Questions 0[x]$  1[]$  2[]$       1c LOC Commands 0[x]$  1[]$  2[]$       2 Best Gaze 0[x]$  1[]$  2[]$       3 Visual 0[x]$  1[]$  2[]$  3[]$      4 Facial Palsy 0[]$  1[x]$  2[]$  3[]$      5a Motor Arm - left 0[x]$  1[]$  2[]$  3[]$  4[]$  UN[]$    5b Motor Arm - Right 0[x]$  1[]$  2[]$  3[]$  4[]$  UN[]$    6a Motor Leg - Left 0[x]$  1[]$  2[]$  3[]$  4[]$  UN[]$    6b Motor Leg - Right 0[x]$  1[]$  2[]$  3[]$  4[]$  UN[]$    7 Limb Ataxia 0[x]$  1[]$  2[]$  3[]$  UN[]$     8 Sensory 0[x]$  1[]$  2[]$  UN[]$      9 Best Language 0[x]$  1[]$  2[]$  3[]$      10 Dysarthria 0[x]$  1[]$  2[]$  UN[]$      11 Extinct. and Inattention 0[x]$  1[]$  2[]$       TOTAL: 1     ROS   Constitutional Denies weight loss, fever and chills.   HEENT Denies changes in vision and hearing.   Respiratory Denies SOB and cough.   CV Denies palpitations and CP   GI Denies abdominal pain, nausea, vomiting and diarrhea.   GU Denies dysuria and urinary frequency.   MSK Denies myalgia and joint pain.   Skin Denies rash and pruritus.   Neurological  Denies headache and syncope.   Psychiatric Denies recent changes in mood. Denies anxiety and depression.    Past History   Past Medical History:  Diagnosis Date   Anxiety    CAD (coronary artery disease)    a. s/p CABG 1994 with LIMA to D2 and LAD, SVG to D1 and ramus intermedius. b. acute inferior MI 2001 s/p rescue PTCA/stent to [facility].  b. NSTEMI 02/2014: s/p LHC with DES to oLCx c. 09/2015 PCI with DES to left main/ostial LCx   Chronic bronchitis ([place])    "they say I get it q yr" (10/03/2015)   CKD (chronic kidney disease), stage II    Depression    ED (erectile dysfunction)    GERD (gastroesophageal reflux disease)    Hyperlipidemia    Hypertension    Ischemic cardiomyopathy    a. EF 48% by nuc in 2012.   Myocardial infarction [facility]) "several"   Nephrolithiasis    Obesity    Prediabetes    Past Surgical History:  Procedure Laterality Date  CARDIAC CATHETERIZATION N/A 02/19/2015   Procedure: Left Heart Cath and Cors/Grafts Angiography;  Surgeon: Burnell Blanks, MD;  LAD & RCA 100%, LIMA-LAD 50%, SVG-OM-D1 100% stenosis, distal limb D1-OM ok, CFX 99%   CARDIAC CATHETERIZATION  02/19/2015   Procedure: Coronary Stent Intervention;  Surgeon: Burnell Blanks, MD; 3.5 x 16 mm Promus Premier DES to the ostial CFX    CARDIAC CATHETERIZATION N/A 10/03/2015   Procedure: Left Heart Cath and Cors/Grafts Angiography;  Surgeon: Peter M Martinique, MD;  Location: [place] CV LAB;  Service: Cardiovascular;  Laterality: N/A;   CARDIAC CATHETERIZATION N/A 10/03/2015   Procedure: Coronary Stent Intervention;  Surgeon: Peter M Martinique, MD;  Location: [place] CV LAB;  Service: Cardiovascular;  Laterality: N/A;  DES- Resolute 4.0x15 to distal left main into the ostial circumflex artery   CARDIAC CATHETERIZATION N/A 10/03/2015   Procedure: Intravascular Ultrasound/IVUS;  Surgeon: Peter M Martinique, MD;  Location: [place] CV LAB;  Service: Cardiovascular;  Laterality: N/A;   CORONARY  ANGIOPLASTY WITH STENT PLACEMENT  1994 - 2001 X 5   "stents each time"   CORONARY ANGIOPLASTY WITH STENT PLACEMENT  10/03/2015   CORONARY ARTERY BYPASS GRAFT  1994   LIMA-D2-LAD, SVG-D1-OM2   CYSTOSCOPY W/ STONE MANIPULATION     Family History  Problem Relation Age of Onset   Coronary artery disease Mother        Died at age 49 of MI   Coronary artery disease Father        MI age 7   Social History   Socioeconomic History   Marital status: Divorced    Spouse name: Not on file   Number of children: 3   Years of education: Not on file   Highest education level: Not on file  Occupational History   Occupation: Retired  Tobacco Use   Smoking status: Never   Smokeless tobacco: Never  Substance and Sexual Activity   Alcohol use: No   Drug use: No   Sexual activity: Yes  Other Topics Concern   Not on file  Social History Narrative   Twenty years ago CAD and CABG,no chest pain since per patient but was eval., In ED for chest pain 1 month prev.-MI  r/o but no stress done. Took meds x1 month after MI but caused ED so stopped them had testosterone check >199,wants cialis or testosterone replacement. "if i can't have sex,life isn't worth living".   Social Determinants of Health   Financial Resource Strain: Not on file  Food Insecurity: No Food Insecurity (03/26/2022)   Hunger Vital Sign    Worried About Running Out of Food in the Last Year: Never true    Ran Out of Food in the Last Year: Never true  Transportation Needs: No Transportation Needs (03/26/2022)   PRAPARE - Hydrologist (Medical): No    Lack of Transportation (Non-Medical): No  Physical Activity: Not on file  Stress: Not on file  Social Connections: Not on file   Allergies  Allergen Reactions   Ciprofloxacin Hcl     Unknown    Peanut-Containing Drug Products     Unknown     Medications   Medications Prior to Admission  Medication Sig Dispense Refill Last Dose   amiodarone  (PACERONE) 200 MG tablet Take by mouth.      apixaban (ELIQUIS) 2.5 MG TABS tablet Take by mouth.      aspirin EC 81 MG tablet Take 1 tablet (81 mg  total) by mouth daily. 90 tablet 3    atorvastatin (LIPITOR) 10 MG tablet Take 10 mg by mouth at bedtime. (Patient not taking: Reported on 10/10/2020)      carvedilol (COREG) 3.125 MG tablet Take 1 tablet (3.125 mg total) by mouth 2 (two) times daily. 180 tablet 4    clotrimazole-betamethasone (LOTRISONE) cream Apply 1 application topically 2 (two) times daily. 60 g 0    esomeprazole (NEXIUM) 40 MG capsule Take 40 mg by mouth daily.      furosemide (LASIX) 40 MG tablet Take by mouth.      losartan (COZAAR) 25 MG tablet Take 25 mg by mouth daily.        Vitals   Vitals:   03/26/22 2132 03/26/22 2200 03/26/22 2300 03/26/22 2332  BP: (!) 140/87   (!) 161/96  Pulse: 72   80  Resp: 18   18  Temp: (!) 97.5 F (36.4 C)   97.8 F (36.6 C)  TempSrc: Oral   Oral  SpO2: 97%   94%  Weight:   124.1 kg   Height:  5' 10"$  (1.778 m)       Body mass index is 39.26 kg/m.  Physical Exam   General: Laying comfortably in bed; in no acute distress.  HENT: Normal oropharynx and mucosa. Normal external appearance of ears and nose.  Neck: Supple, no pain or tenderness  CV: No JVD. No peripheral edema.  Pulmonary: Symmetric Chest rise. Normal respiratory effort.  Abdomen: Soft to touch, non-tender.  Ext: No cyanosis, edema, or deformity  Skin: No rash. Normal palpation of skin.   Musculoskeletal: Normal digits and nails by inspection. No clubbing.   Neurologic Examination  Mental status/Cognition: Alert, oriented to self, place, month and year, good attention.  Speech/language: Fluent, comprehension intact, object naming intact, repetition intact.  Cranial nerves:   CN II Pupils equal and reactive to light, no VF deficits    CN III,IV,VI EOM intact, no gaze preference or deviation, no nystagmus    CN V normal sensation in V1, V2, and V3 segments  bilaterally    CN VII Mild R facial asymmetry   CN VIII normal hearing to speech    CN IX & X normal palatal elevation, no uvular deviation    CN XI 5/5 head turn and 5/5 shoulder shrug bilaterally    CN XII midline tongue protrusion    Motor:  Muscle bulk: normal, tone normal, pronator drift noted in LUE. End intention tremor noted in LUE Mvmt Root Nerve  Muscle Right Left Comments  SA C5/6 Ax Deltoid 5 5   EF C5/6 Mc Biceps 5 5   EE C6/7/8 Rad Triceps 5 5   WF C6/7 Med FCR     WE C7/8 PIN ECU     F Ab C8/T1 U ADM/FDI 5 5   HF L1/2/3 Fem Illopsoas 5 5   KE L2/3/4 Fem Quad 5 5   DF L4/5 D Peron Tib Ant 5 5   PF S1/2 Tibial Grc/Sol 5 5    Sensation:  Light touch Intact throughout   Pin prick    Temperature    Vibration   Proprioception    Coordination/Complex Motor:  - Finger to Nose with mild intention tremor in LUE - Heel to shin intact BL - Rapid alternating movement unable to do in LUE - Gait: deferred.  Labs   CBC:  Recent Labs  Lab 03/26/22 1450  WBC 8.7  NEUTROABS 5.5  HGB 15.2  HCT 44.6  MCV 91.4  PLT 0000000    Basic Metabolic Panel:  Lab Results  Component Value Date   NA 136 03/26/2022   K 3.8 03/26/2022   CO2 24 03/26/2022   GLUCOSE 110 (H) 03/26/2022   BUN 16 03/26/2022   CREATININE 1.38 (H) 03/26/2022   CALCIUM 9.1 03/26/2022   GFRNONAA 54 (L) 03/26/2022   GFRAA 56 (L) 11/04/2015   Lipid Panel:  Lab Results  Component Value Date   LDLCALC 82 10/04/2015   HgbA1c:  Lab Results  Component Value Date   HGBA1C 5.6 02/18/2015   Urine Drug Screen: No results found for: "LABOPIA", "COCAINSCRNUR", "LABBENZ", "AMPHETMU", "THCU", "LABBARB"  Alcohol Level No results found for: "ETH"  CT Head without contrast(Personally reviewed): CTH was negative for a large hypodensity concerning for a large territory infarct or hyperdensity concerning for an ICH  CT angio Head and Neck with contrast(Personally reviewed): No LVO  MRI Brain(Personally  reviewed): Negative for acute stroke  Impression   William Matthews is a 72 y.o. male with PMH significant for aroxysmal atrial fibrillation on Eliquis, CAD status post CABG, CHF, hypertension, hyperlipidemia, hypothyroidism who presents with a left facial droop and slurred speech.  He is unsure of his symptoms but reports daughter told him about R facial asymmetry and has mild slurring on his speech. Exam also demonstrated mild intention tremor in LUE and unable to do rapid alternating movements in LUE and slowed rapid alternating movements in LLE.  Suspect either MRI negative stroke or recrudescence of prior stroke symptoms, possibly from suspected stroke in December?  Recommendations   - Frequent Neuro checks per stroke unit protocol - No need for TTE, he is already on Eliquis. - Recommend obtaining Lipid panel with LDL - Please start statin if LDL > 70 - Recommend HbA1c to evaluate for diabetes and how well it is controlled. - continue Eliquis - SBP goal - permissive hypertension first 24 h < 220/110. Held home meds.  - Recommend Telemetry monitoring for arrythmia - Recommend bedside swallow screen prior to PO intake. - Stroke education booklet - Recommend PT/OT/SLP consult   ______________________________________________________________________   Thank you for the opportunity to take part in the care of this patient. If you have any further questions, please contact the neurology consultation attending.  Signed,  [place] Pager Number [phone] _ _ _   _ __   _ __ _ _  __ __   _ __   __ _

## 2022-03-27 NOTE — Evaluation (Signed)
Speech Language Pathology Evaluation Patient Details Name: William Matthews MRN: [id number] DOB: 1950-05-04 Today's Date: 03/27/2022 Time: [id number] SLP Time Calculation (min) (ACUTE ONLY): 17 min  Problem List:  Patient Active Problem List   Diagnosis Date Noted   Slurred speech 03/26/2022   Chronic kidney disease, stage 3a ([place]) 03/26/2022   Hypothyroidism 03/26/2022   Paroxysmal atrial fibrillation ([place]) 03/26/2022   Heart failure with mildly reduced ejection fraction (HFmrEF) ([place]) 03/26/2022   Pain due to onychomycosis of toenails of both feet 08/23/2020   BPH with obstruction/lower urinary tract symptoms 12/18/2019   Anticoagulant long-term use 09/07/2019   Acute on chronic systolic congestive heart failure ([place]) 09/07/2019   Mitral regurgitation 09/07/2019   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   Atrial flutter ([place]) 08/15/2019   Unstable angina ([place]) 10/03/2015   NSTEMI (non-ST elevated myocardial infarction) ([place]) 02/20/2015   Hyperlipidemia    Prediabetes    Obesity    ED (erectile dysfunction)    CAD (coronary artery disease)    CKD (chronic kidney disease), stage II 02/18/2015   Nonspecific abnormal unspecified cardiovascular function study 08/11/2010   Unstable angina pectoris ([place]) 07/02/2010   Essential hypertension 07/02/2010   Erectile dysfunction 07/02/2010   Past Medical History:  Past Medical History:  Diagnosis Date   Anxiety    CAD (coronary artery disease)    a. s/p CABG 1994 with LIMA to D2 and LAD, SVG to D1 and ramus intermedius. b. acute inferior MI 2001 s/p rescue PTCA/stent to [facility].  b. NSTEMI 02/2014: s/p LHC with DES to oLCx c. 09/2015 PCI with DES to left main/ostial LCx   Chronic bronchitis ([place])    "they say I get it q yr" (10/03/2015)   CKD (chronic kidney disease), stage II    Depression    ED (erectile dysfunction)    GERD (gastroesophageal reflux disease)    Hyperlipidemia    Hypertension    Ischemic cardiomyopathy    a.  EF 48% by nuc in 2012.   Myocardial infarction [facility]) "several"   Nephrolithiasis    Obesity    Prediabetes    Past Surgical History:  Past Surgical History:  Procedure Laterality Date   CARDIAC CATHETERIZATION N/A 02/19/2015   Procedure: Left Heart Cath and Cors/Grafts Angiography;  Surgeon: Burnell Blanks, MD;  LAD & RCA 100%, LIMA-LAD 50%, SVG-OM-D1 100% stenosis, distal limb D1-OM ok, CFX 99%   CARDIAC CATHETERIZATION  02/19/2015   Procedure: Coronary Stent Intervention;  Surgeon: Burnell Blanks, MD; 3.5 x 16 mm Promus Premier DES to the ostial CFX    CARDIAC CATHETERIZATION N/A 10/03/2015   Procedure: Left Heart Cath and Cors/Grafts Angiography;  Surgeon: Peter M Martinique, MD;  Location: [place] CV LAB;  Service: Cardiovascular;  Laterality: N/A;   CARDIAC CATHETERIZATION N/A 10/03/2015   Procedure: Coronary Stent Intervention;  Surgeon: Peter M Martinique, MD;  Location: [place] CV LAB;  Service: Cardiovascular;  Laterality: N/A;  DES- Resolute 4.0x15 to distal left main into the ostial circumflex artery   CARDIAC CATHETERIZATION N/A 10/03/2015   Procedure: Intravascular Ultrasound/IVUS;  Surgeon: Peter M Martinique, MD;  Location: [place] CV LAB;  Service: Cardiovascular;  Laterality: N/A;   CORONARY ANGIOPLASTY WITH STENT PLACEMENT  1994 - 2001 X 5   "stents each time"   CORONARY ANGIOPLASTY WITH STENT PLACEMENT  10/03/2015   CORONARY ARTERY BYPASS GRAFT  1994   LIMA-D2-LAD, SVG-D1-OM2   CYSTOSCOPY W/ STONE MANIPULATION     HPI:  72 yo male  presenting with L facial droop and slurred speech MRI (+) Ventriculomegaly PMH atrial fibrillation on eliquis, CAD s/p CABG CHF HTN HLD Hypothyroidism CKDII MI   Assessment / Plan / Recommendation Clinical Impression  Pt's language assessed to be within normal limits and able to express himself clearly. Speech was 100% intelligile and he feels his speech is back to normal. From cognitive perspective he scored a 23/30 on the SLUMS   with majority of points lost was on memory (recalled 2/5 with SLP and 2/3 when neurologist tested during this assessment) with deficits encoding and decoding. He named 7 animals in one min and wrote correct numbers and time on clock but did not differentiate length of hands. He was 100% oriented. Pt was in agreement with continued ST for memory "if you think it will help" He lives with daughter and son in law. Daughter hands him his meds to take every day and he state he has "no finances."    SLP Assessment  SLP Recommendation/Assessment: Patient needs continued Speech Lanaguage Pathology Services SLP Visit Diagnosis: Cognitive communication deficit (R41.841)    Recommendations for follow up therapy are one component of a multi-disciplinary discharge planning process, led by the attending physician.  Recommendations may be updated based on patient status, additional functional criteria and insurance authorization.    Follow Up Recommendations  No SLP follow up    Assistance Recommended at Discharge  Intermittent Supervision/Assistance  Functional Status Assessment Patient has had a recent decline in their functional status and demonstrates the ability to make significant improvements in function in a reasonable and predictable amount of time.  Frequency and Duration min 1 x/week  1 week      SLP Evaluation Cognition  Overall Cognitive Status: Impaired/Different from baseline Arousal/Alertness: Awake/alert Orientation Level: Oriented to person;Oriented to place;Oriented to time Year: 2024 Day of Week: Correct Attention: Sustained Sustained Attention: Appears intact Memory: Impaired Memory Impairment: Storage deficit;Retrieval deficit (2/5 with SLP and 2/3 when neurologist asked) Awareness: Appears intact Problem Solving: Appears intact Safety/Judgment:  (will assess further)       Comprehension  Auditory Comprehension Overall Auditory Comprehension: Appears within functional  limits for tasks assessed Visual Recognition/Discrimination Discrimination: Not tested Reading Comprehension Reading Status: Not tested    Expression Expression Primary Mode of Expression: Verbal Verbal Expression Overall Verbal Expression: Appears within functional limits for tasks assessed Naming: Impairment Divergent:  (7 animals one min) Pragmatics: No impairment Written Expression Dominant Hand: Right Written Expression: Not tested   Oral / Motor  Motor Speech Overall Motor Speech: Appears within functional limits for tasks assessed Respiration: Within functional limits Phonation: Normal Resonance: Within functional limits Articulation: Within functional limitis Intelligibility: Intelligible Motor Planning: Witnin functional limits            [place] Siren 03/27/2022, 11:10 AM

## 2022-03-27 NOTE — TOC Benefit Eligibility Note (Signed)
Transition of Care [facility]) Benefit Eligibility Note    Patient Details  Name: William Matthews MRN: [id number] Date of Birth: 1950/05/28   Medication/Dose: Arne Cleveland  5 MG BID  Covered?: Yes  Tier: 2 Drug  Prescription Coverage [facility]  Spoke with Person/Company/Phone Number:: OLLIE   @ [id number] [facility]  [id number]   #  [phone]  Co-Pay: Johnsie Kindred  Prior Approval: No  Deductible: Met       Memory [place] Phone Number: 03/27/2022, 3:57 PM

## 2022-03-27 NOTE — Evaluation (Signed)
Physical Therapy Evaluation Patient Details Name: William Matthews MRN: [id number] DOB: 08/22/1950 Today's Date: 03/27/2022  History of Present Illness  72 yo male presenting with L facial droop and slurred speech MRI (+) Ventriculomegaly PMH atrial fibrillation on eliquis, CAD s/p CABG CHF HTN HLD Hypothyroidism CKDII MI  Clinical Impression  Pt with mildly unsteady gait pattern and decreased cognition which he masks with humor.  He reports h/o falls and some dizziness history which is likely a contributing factor.  He wears glasses for reading and lives at home with his daughter who can supervise.  If he is here longer, it would be worth looking more at his vestibular system (ventriculomegaly can produce some dizziness and imbalance) to make sure he does not need specific exercises/treatment.   PT to follow acutely for deficits listed below.        Recommendations for follow up therapy are one component of a multi-disciplinary discharge planning process, led by the attending physician.  Recommendations may be updated based on patient status, additional functional criteria and insurance authorization.  Follow Up Recommendations Outpatient PT      Assistance Recommended at Discharge Intermittent Supervision/Assistance  Patient can return home with the following  Direct supervision/assist for financial management;Direct supervision/assist for medications management    Equipment Recommendations None recommended by PT  Recommendations for Other Services       Functional Status Assessment Patient has had a recent decline in their functional status and demonstrates the ability to make significant improvements in function in a reasonable and predictable amount of time.     Precautions / Restrictions Precautions Precautions: Fall Precaution Comments: staggers with head turns Restrictions Weight Bearing Restrictions: No      Mobility  Bed Mobility Overal bed mobility: Independent                   Transfers Overall transfer level: Independent                      Ambulation/Gait Ambulation/Gait assistance: Min guard Gait Distance (Feet): 150 Feet Assistive device: None Gait Pattern/deviations: Step-through pattern, Staggering left, Staggering right Gait velocity: decreased Gait velocity interpretation: 1.31 - 2.62 ft/sec, indicative of limited community ambulator   General Gait Details: Pt would stagger left with right head turn and stagger right with left head turn.  Stairs            Wheelchair Mobility    Modified Rankin (Stroke Patients Only) Modified Rankin (Stroke Patients Only) Pre-Morbid Rankin Score: No symptoms Modified Rankin: Moderately severe disability     Balance Overall balance assessment: Needs assistance Sitting-balance support: Feet supported, No upper extremity supported Sitting balance-Leahy Scale: Good     Standing balance support: No upper extremity supported Standing balance-Leahy Scale: Fair           Rhomberg - Eyes Opened: 30 (min guard assist) Rhomberg - Eyes Closed: 10 (min assist significant increase in sway)                 Pertinent Vitals/Pain Pain Assessment Pain Assessment: No/denies pain    Home Living Family/patient expects to be discharged to:: Private residence Living Arrangements: Children Available Help at Discharge: Family;Available 24 hours/day (per pt report, daughter is home all the time) Type of Home: House Home Access: Stairs to enter Entrance Stairs-Rails: Psychiatric nurse of Steps: 3   Home Layout: One level Home Equipment: Conservation officer, nature (2 wheels);Cane - single point Additional Comments: no family present but  pt reports information above.    Prior Function Prior Level of Function : Independent/Modified Independent;Driving                     Hand Dominance   Dominant Hand: Right    Extremity/Trunk Assessment   Upper Extremity  Assessment Upper Extremity Assessment: Defer to OT evaluation    Lower Extremity Assessment Lower Extremity Assessment: LLE deficits/detail LLE Deficits / Details: left leg is mildly weaker than R leg with 4/5 strength, howeer, no noticeable difference functionally during gait.    Cervical / Trunk Assessment Cervical / Trunk Assessment: Normal  Communication   Communication: No difficulties  Cognition Arousal/Alertness: Awake/alert Behavior During Therapy: WFL for tasks assessed/performed Overall Cognitive Status: Impaired/Different from baseline Area of Impairment: Orientation, Memory, Attention, Safety/judgement, Awareness, Problem solving                 Orientation Level: Disoriented to, Time, Place Current Attention Level: Selective Memory: Decreased short-term memory   Safety/Judgement: Decreased awareness of safety, Decreased awareness of deficits Awareness: Intellectual Problem Solving: Slow processing General Comments: Pt having difficulty managing his phone as I looked on, attempting to text, but ultimately not texting anything readable.        General Comments      Exercises     Assessment/Plan    PT Assessment Patient needs continued PT services  PT Problem List Decreased strength;Decreased activity tolerance;Decreased balance;Decreased cognition;Decreased knowledge of use of DME;Decreased safety awareness       PT Treatment Interventions Gait training;DME instruction;Stair training;Functional mobility training;Therapeutic activities;Therapeutic exercise;Balance training;Cognitive remediation;Neuromuscular re-education;Patient/family education    PT Goals (Current goals can be found in the Care Plan section)  Acute Rehab PT Goals Patient Stated Goal: to go home today if he can PT Goal Formulation: With patient Time For Goal Achievement: 04/11/22 Potential to Achieve Goals: Good    Frequency Min 3X/week     Co-evaluation                AM-PAC PT "6 Clicks" Mobility  Outcome Measure Help needed turning from your back to your side while in a flat bed without using bedrails?: None Help needed moving from lying on your back to sitting on the side of a flat bed without using bedrails?: None Help needed moving to and from a bed to a chair (including a wheelchair)?: A Little Help needed standing up from a chair using your arms (e.g., wheelchair or bedside chair)?: A Little Help needed to walk in hospital room?: A Little Help needed climbing 3-5 steps with a railing? : A Little 6 Click Score: 20    End of Session Equipment Utilized During Treatment: Gait belt Activity Tolerance: Patient tolerated treatment well Patient left: in chair;with call bell/phone within reach   PT Visit Diagnosis: Muscle weakness (generalized) (M62.81);Difficulty in walking, not elsewhere classified (R26.2);Repeated falls (R29.6)    Time: 1456-1510 PT Time Calculation (min) (ACUTE ONLY): 14 min   Charges:   PT Evaluation $PT Eval Moderate Complexity: 1 Mod     Verdene Lennert, PT, DPT  Acute Rehabilitation Secure chat is best for contact #[phone] office

## 2022-03-28 ENCOUNTER — Observation Stay (HOSPITAL_BASED_OUTPATIENT_CLINIC_OR_DEPARTMENT_OTHER): Payer: Medicare HMO

## 2022-03-28 DIAGNOSIS — I6521 Occlusion and stenosis of right carotid artery: Secondary | ICD-10-CM | POA: Diagnosis not present

## 2022-03-28 DIAGNOSIS — I639 Cerebral infarction, unspecified: Secondary | ICD-10-CM | POA: Diagnosis not present

## 2022-03-28 DIAGNOSIS — I480 Paroxysmal atrial fibrillation: Secondary | ICD-10-CM | POA: Diagnosis not present

## 2022-03-28 DIAGNOSIS — I10 Essential (primary) hypertension: Secondary | ICD-10-CM | POA: Diagnosis not present

## 2022-03-28 DIAGNOSIS — R4781 Slurred speech: Secondary | ICD-10-CM | POA: Diagnosis not present

## 2022-03-28 LAB — CBC
HCT: 41.7 % (ref 39.0–52.0)
Hemoglobin: 13.8 g/dL (ref 13.0–17.0)
MCH: 30.5 pg (ref 26.0–34.0)
MCHC: 33.1 g/dL (ref 30.0–36.0)
MCV: 92.1 fL (ref 80.0–100.0)
Platelets: 142 10*3/uL — ABNORMAL LOW (ref 150–400)
RBC: 4.53 MIL/uL (ref 4.22–5.81)
RDW: 12.9 % (ref 11.5–15.5)
WBC: 9.3 10*3/uL (ref 4.0–10.5)
nRBC: 0 % (ref 0.0–0.2)

## 2022-03-28 LAB — BASIC METABOLIC PANEL
Anion gap: 12 (ref 5–15)
BUN: 16 mg/dL (ref 8–23)
CO2: 25 mmol/L (ref 22–32)
Calcium: 8.7 mg/dL — ABNORMAL LOW (ref 8.9–10.3)
Chloride: 100 mmol/L (ref 98–111)
Creatinine, Ser: 1.57 mg/dL — ABNORMAL HIGH (ref 0.61–1.24)
GFR, Estimated: 46 mL/min — ABNORMAL LOW (ref >60)
Glucose, Bld: 111 mg/dL — ABNORMAL HIGH (ref 70–99)
Potassium: 3.5 mmol/L (ref 3.5–5.1)
Sodium: 137 mmol/L (ref 135–145)

## 2022-03-28 MED ORDER — ATORVASTATIN CALCIUM 20 MG PO TABS: 20.0000 mg | ORAL_TABLET | Freq: Every day | ORAL | 2 refills | Status: DC

## 2022-03-28 MED ORDER — POTASSIUM CHLORIDE CRYS ER 20 MEQ PO TBCR
40.0000 meq | EXTENDED_RELEASE_TABLET | Freq: Once | ORAL | Status: AC
  Administered 2022-03-28: 40 meq via ORAL
  Filled 2022-03-28: qty 2

## 2022-03-28 NOTE — Progress Notes (Signed)
Carotid artery duplex has been completed. Preliminary results can be found in CV Proc through chart review.   03/28/22 1:00 PM William Matthews RVT

## 2022-03-28 NOTE — Progress Notes (Signed)
Mobility Specialist: Progress Note   03/28/22 1233  Mobility  Activity Ambulated with assistance in hallway  Level of Assistance Minimal assist, patient does 75% or more  Assistive Device None  Distance Ambulated (ft) 250 ft  Activity Response Tolerated well  Mobility Referral Yes  $Mobility charge 1 Mobility   Pre-Mobility: 71 HR Post-Mobility: 63 HR  Pt received in the bed and agreeable to mobility. Mod I with bed mobility and minA to stand. No LOB this session with head turns with pt stating he feels better today. No c/o throughout. Pt back to bed after session with call bell and phone at his side. Bed alarm is on.   William Matthews Mobility Specialist Please contact via SecureChat or Rehab office at [phone]

## 2022-03-28 NOTE — TOC Transition Note (Signed)
Transition of Care [facility]) - CM/SW Discharge Note   Patient Details  Name: William Matthews MRN: [id number] Date of Birth: 01-30-1951  [facility] CM/SW Contact:  Carles Collet, RN Phone Number: 03/28/2022, 2:29 PM   Clinical Narrative:     Damaris Schooner w patient, discussed recs for OP PT OT after DC. Discussed locations and referral made to [facility] for PT OT. Referral [id number]. Added to AVS. No DME needs        Patient Goals and CMS Choice      Discharge Placement                         Discharge Plan and Services Additional resources added to the After Visit Summary for                                       Social Determinants of Health (SDOH) Interventions SDOH Screenings   Food Insecurity: No Food Insecurity (03/26/2022)  Housing: Low Risk  (03/26/2022)  Transportation Needs: No Transportation Needs (03/26/2022)  Utilities: Not At Risk (03/26/2022)  Tobacco Use: Low Risk  (03/26/2022)     Readmission Risk Interventions     No data to display

## 2022-03-28 NOTE — Discharge Summary (Signed)
[facility]  Physician Discharge Summary   Patient ID: William Matthews MRN: [id number] DOB/AGE: Jun 19, 1950 72 y.o.  Admit date: 03/26/2022 Discharge date: 03/28/2022    PCP: Patient, No Pcp Per  DISCHARGE DIAGNOSES:  Symptomatic right ICA stenosis   Paroxysmal atrial fibrillation (HCC)   Heart failure with mildly reduced ejection fraction (HFmrEF) (HCC)   Essential hypertension   CAD (coronary artery disease)   Hyperlipidemia   Chronic kidney disease, stage 3a (HCC)   RECOMMENDATIONS FOR OUTPATIENT FOLLOW UP: Dr. Scot Dock with vascular surgery to arrange outpatient follow-up for ICA stenosis   Home Health: Outpatient PT OT Equipment/Devices: None  CODE STATUS: Full code  DISCHARGE CONDITION: fair  Diet recommendation: Heart healthy  INITIAL HISTORY:  72 y.o. male with medical history significant for PAF on Eliquis, CAD s/p CABG and DES, CHF, CKD stage IIIa, HTN, HLD, hypothyroidism, BPH who presented to the ED for evaluation of left facial droop and slurred speech. Patient states he initially noted left-sided facial droop beginning at the end of December associated with slurred speech.  These changes resolved the next day.  Since then he has had intermittent episodes of dizziness without syncope.  Patient was hospitalized for further management.   Consultants: Neurology   Procedures: Echocardiogram   HOSPITAL COURSE:   Symptomatic right ICA stenosis Patient presented with facial droop and slurred speech.  MRI negative for acute stroke. Echocardiogram shows normal systolic function.  No significant valvular abnormalities noted. Neurology was consulted.  They are concerned about symptomatic ICA stenosis based on CT angiogram findings.  Carotid Doppler has been ordered.  This also shows 40 to 60% stenosis.  Discussed with Dr. Scot Dock with vascular surgery who reviewed all the imaging studies.  He mentioned that the velocities on the Doppler studies suggest the stenosis  is more likely to be closer to 40% to 60%.  He will arrange outpatient follow-up with repeat Doppler ultrasound in his office.  Patient's symptoms have resolved.  Seen by PT and OT.  Outpatient PT OT recommended.  Ambulatory referral has been sent.   LDL is 48.  Patient is on atorvastatin. HbA1c is 5.7 Patient is on aspirin.  Eliquis being continued.   Paroxysmal atrial fibrillation On amiodarone carvedilol and Eliquis.     Chronic systolic CHF Improvement in EF noted on echocardiogram from this hospital stay.  Previously it was 40 to 45% in 2022.  Continue home medications.   Coronary artery disease He is status post CABG and drug-eluting stents. Stable.  Continue cardiac medications.   Essential hypertension Continue home medications   Chronic kidney disease stage IIIa  Hyperlipidemia Continue atorvastatin.  LDL is 48.   Obesity Estimated body mass index is 39.26 kg/m as calculated from the following:   Height as of this encounter: 5' 10"$  (1.778 m).   Weight as of this encounter: 124.1 kg.   Patient is stable.  Okay for discharge home today.   PERTINENT LABS:  The results of significant diagnostics from this hospitalization (including imaging, microbiology, ancillary and laboratory) are listed below for reference.    Labs:   Basic Metabolic Panel: Recent Labs  Lab 03/26/22 1450 03/27/22 0644 03/28/22 0453  NA 136 138 137  K 3.8 3.7 3.5  CL 103 102 100  CO2 24 24 25  $ GLUCOSE 110* 104* 111*  BUN 16 18 16  $ CREATININE 1.38* 1.44* 1.57*  CALCIUM 9.1 8.8* 8.7*    CBC: Recent Labs  Lab 03/26/22 1450 03/27/22 0644 03/28/22 0453  WBC  8.7 8.7 9.3  NEUTROABS 5.5  --   --   HGB 15.2 13.6 13.8  HCT 44.6 41.3 41.7  MCV 91.4 92.8 92.1  PLT 178 139* 142*     CBG: Recent Labs  Lab 03/26/22 1605  GLUCAP 117*     IMAGING STUDIES VAS US CAROTID  Result Date: 03/28/2022 Carotid Arterial Duplex Study Patient Name:  William Matthews  Date of Exam:   03/28/2022  Medical Rec #: [id number]       Accession #:    [id number] Date of Birth: 28-Oct-1950        Patient Gender: M Patient Age:   25 years Exam Location:  [facility] Procedure:      VAS US CAROTID Referring Phys: Elwin Sleight DE LA TORRE --------------------------------------------------------------------------------  Indications:       Stenosis. Risk Factors:      Hypertension, coronary artery disease. Limitations        Today's exam was limited due to the high bifurcation of the                    carotid, the patient's respiratory variation and patient                    positioning, patient somnolence. Comparison Study:  215/2024 - CT ANGIO HEAD NECK W WO CM                    IMPRESSION:                    1. No acute intracranial process.                    2. Nonopacification of the right V1 segment, with minimal                    reconstitution in the proximal V2 segment, more robust                    reconstitution in the distal V2 segment, and short segment                    occlusion                    of the distal V4 segment. The right PICA is poorly opacified                    but                    appears patent.                    3. 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 No other                    hemodynamically                    significant stenosis in the neck.                    4. Severe stenosis in a A3 branch of the left anterior                    cerebral                    artery. Other intracranial vasculature is patent  but                    irregular.                    5. Aortic atherosclerosis. Performing Technologist: Oliver Hum RVT  Examination Guidelines: A complete evaluation includes B-mode imaging, spectral Doppler, color Doppler, and power Doppler as needed of all accessible portions of each vessel. Bilateral testing is considered an integral part of a complete examination. Limited examinations for reoccurring indications may be performed as noted.  Right Carotid  Findings: +----------+--------+-------+--------+--------------------------------+--------+           PSV cm/sEDV    StenosisPlaque Description              Comments                   cm/s                                                    +----------+--------+-------+--------+--------------------------------+--------+ CCA Prox  118     13             smooth and heterogenous                  +----------+--------+-------+--------+--------------------------------+--------+ CCA Distal93      16             smooth and heterogenous                  +----------+--------+-------+--------+--------------------------------+--------+ ICA Prox  136     23             smooth, heterogenous and                                                  calcific                                 +----------+--------+-------+--------+--------------------------------+--------+ ICA Mid   47      12             smooth and heterogenous                  +----------+--------+-------+--------+--------------------------------+--------+ ICA Distal38      13                                             tortuous +----------+--------+-------+--------+--------------------------------+--------+ ECA       80      9                                                       +----------+--------+-------+--------+--------------------------------+--------+ +----------+--------+-------+--------+-------------------+           PSV cm/sEDV cmsDescribeArm Pressure (mmHG) +----------+--------+-------+--------+-------------------+ Subclavian101                                        +----------+--------+-------+--------+-------------------+ +---------+--------+--+--------+-+---------+  VertebralPSV cm/s22EDV cm/s6Antegrade +---------+--------+--+--------+-+---------+ Stenosis noted in the proximal ICA is 40-59% based on the PSV, but 1-39% based on the EDV. Left Carotid Findings:  +----------+--------+--------+--------+-----------------------+--------+           PSV cm/sEDV cm/sStenosisPlaque Description     Comments +----------+--------+--------+--------+-----------------------+--------+ CCA Prox  142     23              smooth and heterogenous         +----------+--------+--------+--------+-----------------------+--------+ CCA Distal113     20              smooth and heterogenous         +----------+--------+--------+--------+-----------------------+--------+ ICA Prox  42      12              smooth and heterogenous         +----------+--------+--------+--------+-----------------------+--------+ ICA Distal39      12                                     tortuous +----------+--------+--------+--------+-----------------------+--------+ ECA       69      8                                               +----------+--------+--------+--------+-----------------------+--------+ +----------+--------+--------+--------+-------------------+           PSV cm/sEDV cm/sDescribeArm Pressure (mmHG) +----------+--------+--------+--------+-------------------+ [id number]                                         +----------+--------+--------+--------+-------------------+ +---------+--------+--+--------+--+---------+ VertebralPSV cm/s53EDV cm/s22Antegrade +---------+--------+--+--------+--+---------+   Summary: Right Carotid: Stenosis noted in the proximal ICA is 40-59% based on the PSV,                but 1-39% based on the EDV. Left Carotid: Velocities in the left ICA are consistent with a 1-39% stenosis. Vertebrals: Bilateral vertebral arteries demonstrate antegrade flow. *See table(s) above for measurements and observations.     Preliminary    ECHOCARDIOGRAM COMPLETE  Result Date: 03/27/2022    ECHOCARDIOGRAM REPORT   Patient Name:   William Matthews Date of Exam: 03/27/2022 Medical Rec #:  [id number]      Height:       70.0 in Accession #:     [id number]     Weight:       273.6 lb Date of Birth:  12-Apr-1950       BSA:          2.385 m Patient Age:    58 years       BP:           124/74 mmHg Patient Gender: M              HR:           73 bpm. Exam Location:  Inpatient Procedure: 2D Echo, Cardiac Doppler and Color Doppler Indications:    TIA  History:        Patient has prior history of Echocardiogram examinations, most                 recent 01/08/2016. CAD, Chronic kidney disease,  Arrythmias:Paroxysmal A-Fib; Risk Factors:Hypertension and                 Dyslipidemia.  Sonographer:    Johny Chess RDCS Referring Phys: [id number] VISHAL R PATEL  Sonographer Comments: Image acquisition challenging due to patient body habitus. IMPRESSIONS  1. Left ventricular ejection fraction, by estimation, is 50 to 55%. The left ventricle has low normal function. The left ventricle has no regional wall motion abnormalities. There is moderate left ventricular hypertrophy. Left ventricular diastolic parameters were normal.  2. Right ventricular systolic function is normal. The right ventricular size is normal. There is normal pulmonary artery systolic pressure.  3. The mitral valve is normal in structure. Mild mitral valve regurgitation. No evidence of mitral stenosis.  4. The aortic valve is normal in structure. Aortic valve regurgitation is mild. No aortic stenosis is present.  5. The inferior vena cava is normal in size with greater than 50% respiratory variability, suggesting right atrial pressure of 3 mmHg. Conclusion(s)/Recommendation(s): No intracardiac source of embolism detected on this transthoracic study. Consider a transesophageal echocardiogram to exclude cardiac source of embolism if clinically indicated. FINDINGS  Left Ventricle: Left ventricular ejection fraction, by estimation, is 50 to 55%. The left ventricle has low normal function. The left ventricle has no regional wall motion abnormalities. The left ventricular internal cavity size  was normal in size. There is moderate left ventricular hypertrophy. Left ventricular diastolic parameters were normal. Right Ventricle: The right ventricular size is normal. No increase in right ventricular wall thickness. Right ventricular systolic function is normal. There is normal pulmonary artery systolic pressure. The tricuspid regurgitant velocity is 2.13 m/s, and  with an assumed right atrial pressure of 3 mmHg, the estimated right ventricular systolic pressure is 123456 mmHg. Left Atrium: Left atrial size was normal in size. Right Atrium: Right atrial size was normal in size. Pericardium: There is no evidence of pericardial effusion. Mitral Valve: The mitral valve is normal in structure. Mild mitral valve regurgitation. No evidence of mitral valve stenosis. Tricuspid Valve: The tricuspid valve is normal in structure. Tricuspid valve regurgitation is trivial. No evidence of tricuspid stenosis. Aortic Valve: The aortic valve is normal in structure. Aortic valve regurgitation is mild. No aortic stenosis is present. Pulmonic Valve: The pulmonic valve was normal in structure. Pulmonic valve regurgitation is not visualized. No evidence of pulmonic stenosis. Aorta: The aortic root is normal in size and structure. Venous: The inferior vena cava is normal in size with greater than 50% respiratory variability, suggesting right atrial pressure of 3 mmHg. IAS/Shunts: No atrial level shunt detected by color flow Doppler.  LEFT VENTRICLE PLAX 2D LVIDd:         5.30 cm      Diastology LVIDs:         4.20 cm      LV e' medial:    5.66 cm/s LV PW:         1.40 cm      LV E/e' medial:  16.0 LV IVS:        1.40 cm      LV e' lateral:   10.30 cm/s LVOT diam:     2.00 cm      LV E/e' lateral: 8.8 LV SV:         63 LV SV Index:   27 LVOT Area:     3.14 cm  LV Volumes (MOD) LV vol d, MOD A2C: 111.0 ml LV vol s, MOD A2C: 61.7 ml LV SV MOD  A2C:     49.3 ml RIGHT VENTRICLE             IVC RV Basal diam:  2.90 cm     IVC diam: 1.90 cm  RV S prime:     11.30 cm/s TAPSE (M-mode): 2.2 cm LEFT ATRIUM           Index        RIGHT ATRIUM           Index LA diam:      4.40 cm 1.85 cm/m   RA Area:     22.10 cm LA Vol (A2C): 50.0 ml 20.97 ml/m  RA Volume:   68.50 ml  28.73 ml/m LA Vol (A4C): 95.8 ml 40.17 ml/m  AORTIC VALVE LVOT Vmax:   103.00 cm/s LVOT Vmean:  67.000 cm/s LVOT VTI:    0.202 m  AORTA Ao Root diam: 3.20 cm MITRAL VALVE               TRICUSPID VALVE MV Area (PHT): 3.85 cm    TR Peak grad:   18.1 mmHg MV Decel Time: 197 msec    TR Vmax:        213.00 cm/s MV E velocity: 90.30 cm/s MV A velocity: 69.20 cm/s  SHUNTS MV E/A ratio:  1.30        Systemic VTI:  0.20 m                            Systemic Diam: 2.00 cm Candee Furbish MD Electronically signed by Candee Furbish MD Signature Date/Time: 03/27/2022/11:53:41 AM    Final    MR BRAIN WO CONTRAST  Result Date: 03/27/2022 CLINICAL DATA:  Left facial droop, slurred speech, stroke suspected EXAM: MRI HEAD WITHOUT CONTRAST TECHNIQUE: Multiplanar, multiecho pulse sequences of the brain and surrounding structures were obtained without intravenous contrast. COMPARISON:  No prior MRI available, correlation is made with CT head 03/26/2022 FINDINGS: Brain: No restricted diffusion to suggest acute or subacute infarct. No acute hemorrhage, mass, mass effect, or midline shift. No extra-axial collection. Normal pituitary and craniocervical junction. No hemosiderin deposition to suggest remote hemorrhage. T2 hyperintense signal in the periventricular white matter, likely the sequela of moderate chronic small vessel ischemic disease. Ventriculomegaly, with crowding of the sulci near the vertex and an acute callosum angle. Vascular: Normal arterial flow voids. Skull and upper cervical spine: Normal marrow signal. Sinuses/Orbits: Clear paranasal sinuses. No acute finding in the orbits. Other: The mastoid air cells are well aerated. IMPRESSION: 1. No acute intracranial process. No evidence of acute or  subacute infarct. 2. Ventriculomegaly, with crowding of the sulci near the vertex and an acute callosum angle, which can be seen in the setting of normal pressure hydrocephalus. Electronically Signed   By: Merilyn Baba M.D.   On: 03/27/2022 03:52   CT ANGIO HEAD NECK W WO CM  Result Date: 03/26/2022 CLINICAL DATA:  Stroke suspected, slurred speech, facial droop, dizziness EXAM: CT ANGIOGRAPHY HEAD AND NECK TECHNIQUE: Multidetector CT imaging of the head and neck was performed using the standard protocol during bolus administration of intravenous contrast. Multiplanar CT image reconstructions and MIPs were obtained to evaluate the vascular anatomy. Carotid stenosis measurements (when applicable) are obtained utilizing NASCET criteria, using the distal internal carotid diameter as the denominator. RADIATION DOSE REDUCTION: This exam was performed according to the departmental dose-optimization program which includes automated exposure control, adjustment of the mA and/or kV according to  patient size and/or use of iterative reconstruction technique. CONTRAST:  74m OMNIPAQUE IOHEXOL 350 MG/ML SOLN COMPARISON:  No prior CTA, correlation is made with CT head 03/26/2022 FINDINGS: CT HEAD FINDINGS Brain: No evidence of acute infarct, hemorrhage, mass, mass effect, or midline shift. No hydrocephalus or extra-axial fluid collection. Vascular: No definite hyperdense vessel, but there is significant calcification in the distal right vertebral artery (series 6, image 7). Skull: Negative for fracture or focal lesion. Sinuses/Orbits: Small mucous retention cyst in the left maxillary sinus. No acute finding in the orbits. Other: The mastoid air cells are well aerated. CTA NECK FINDINGS Aortic arch: Standard branching. Imaged portion shows no evidence of aneurysm or dissection. No significant stenosis of the major arch vessel origins. Aortic atherosclerosis. Right carotid system: 60% stenosis in the proximal right ICA,  secondary to calcified and noncalcified plaque (series 10, image 154). No evidence of dissection or occlusion Left carotid system: No evidence of dissection, occlusion, or hemodynamically significant stenosis (greater than 50%). Vertebral arteries: Nonopacification of the right V1 segment, with minimal reconstitution in the proximal V2 segment (series 10, image 123), and more robust reconstitution in the distal V2 segment (series 10, image 170). The V3 segment is patent. The left vertebral artery is patent from its origin to the skull base. No evidence of dissection or occlusion in the left vertebral artery. Skeleton: No acute osseous abnormality. Degenerative changes in the cervical spine. Prior median sternotomy. Poor dentition, with multifocal periapical lucencies and dental caries. Other neck: No acute finding. Upper chest: No focal pulmonary opacity or pleural effusion. Review of the MIP images confirms the above findings CTA HEAD FINDINGS Anterior circulation: Both internal carotid arteries are patent to the termini, with calcifications but without significant stenosis. A1 segments patent. Normal anterior communicating artery. Severe stenosis in an A3 branch (series 10, image 302). Anterior cerebral arteries are otherwise patent to their distal aspects. No M1 stenosis or occlusion. Multifocal irregularity with no more than mild focal stenosis. MCA branches perfused to their distal aspects. Posterior circulation: The left vertebral artery is patent to the vertebrobasilar junction without significant stenosis. Poor opacification of the right V4, with short segment occlusion distally (series 10, image 228-233), with flow at the vertebrobasilar junction, likely retrograde. The left PICA is patent. The right PICA is poorly opacified but appears patent. Basilar patent to its distal aspect. Superior cerebellar arteries patent proximally. Patent P1 segments. PCAs perfused to their distal aspects with multifocal  irregularity but without stenosis. The bilateral posterior communicating arteries are not visualized. Venous sinuses: As permitted by contrast timing, patent. Anatomic variants: None significant. Review of the MIP images confirms the above findings IMPRESSION: 1. No acute intracranial process. 2. Nonopacification of the right V1 segment, with minimal reconstitution in the proximal V2 segment, more robust reconstitution in the distal V2 segment, and short segment occlusion of the distal V4 segment. The right PICA is poorly opacified but appears patent. 3. 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 No other hemodynamically significant stenosis in the neck. 4. Severe stenosis in a A3 branch of the left anterior cerebral artery. Other intracranial vasculature is patent but irregular. 5. Aortic atherosclerosis. Aortic Atherosclerosis (ICD10-I70.0). These results were called by telephone at the time of interpretation on 03/26/2022 at 10:26 pm [facility] who verbally acknowledged these results. Electronically Signed   By: AMerilyn BabaM.D.   On: 03/26/2022 22:26   CT Head Wo Contrast  Result Date: 03/26/2022 CLINICAL DATA:  Transient ischemic attack.  EXAM: CT HEAD WITHOUT CONTRAST TECHNIQUE: Contiguous axial images were obtained from the base of the skull through the vertex without intravenous contrast. RADIATION DOSE REDUCTION: This exam was performed according to the departmental dose-optimization program which includes automated exposure control, adjustment of the mA and/or kV according to patient size and/or use of iterative reconstruction technique. COMPARISON:  Head CT 09/02/2012. FINDINGS: Brain: No acute intracranial hemorrhage. Mild chronic small-vessel disease. Gray-white differentiation is otherwise preserved. Mild central pattern of volume loss. No extra-axial collection. Basilar cisterns are patent. Vascular: No hyperdense vessel or unexpected calcification. Skull: No calvarial fracture or  suspicious bone lesion. Skull base is unremarkable. Sinuses/Orbits: Unremarkable. Other: None. IMPRESSION: 1. No acute intracranial abnormality. 2. Mild chronic small-vessel disease. Mild central pattern of volume loss. Electronically Signed   By: Emmit Alexanders M.D.   On: 03/26/2022 16:30    DISCHARGE EXAMINATION: Vitals:   03/27/22 2334 03/28/22 0324 03/28/22 0749 03/28/22 1148  BP: 120/75 (!) 159/86 (!) 176/98 (!) 153/80  Pulse: 72 60 66 70  Resp:   18 18  Temp: 98.4 F (36.9 C) 98.3 F (36.8 C) 97.7 F (36.5 C) 98.4 F (36.9 C)  TempSrc: Oral Oral Oral Oral  SpO2: 94% 95% 98% 97%  Weight:      Height:       General appearance: Awake alert.  In no distress Resp: Clear to auscultation bilaterally.  Normal effort Cardio: S1-S2 is normal regular.  No S3-S4.  No rubs murmurs or bruit GI: Abdomen is soft.  Nontender nondistended.  Bowel sounds are present normal.  No masses organomegaly   DISPOSITION: Home  Discharge Instructions     Ambulatory referral to Occupational Therapy   Complete by: As directed    TIA, physical deconditioning   Ambulatory referral to Physical Therapy   Complete by: As directed    TIA, physical deconditioning   Call MD for:  difficulty breathing, headache or visual disturbances   Complete by: As directed    Call MD for:  extreme fatigue   Complete by: As directed    Call MD for:  persistant dizziness or light-headedness   Complete by: As directed    Call MD for:  persistant nausea and vomiting   Complete by: As directed    Call MD for:  severe uncontrolled pain   Complete by: As directed    Call MD for:  temperature >100.4   Complete by: As directed    Diet - low sodium heart healthy   Complete by: As directed    Discharge instructions   Complete by: As directed    Please take your medications as prescribed.  Please be sure to follow-up with your primary care provider.  A referral has been made to therapy for outpatient PT and OT. The  vascular surgeon will arrange an follow-up appointment for you in 6 months to repeat the ultrasound.  Seek attention if your symptoms recur.  You were cared for by a hospitalist during your hospital stay. If you have any questions about your discharge medications or the care you received while you were in the hospital after you are discharged, you can call the unit and asked to speak with the hospitalist on call if the hospitalist that took care of you is not available. Once you are discharged, your primary care physician will handle any further medical issues. Please note that NO REFILLS for any discharge medications will be authorized once you are discharged, as it is imperative that you return  to your primary care physician (or establish a relationship with a primary care physician if you do not have one) for your aftercare needs so that they can reassess your need for medications and monitor your lab values. If you do not have a primary care physician, you can call [phone] for a physician referral.   Increase activity slowly   Complete by: As directed           Allergies as of 03/28/2022       Reactions   Ciprofloxacin Hcl Other (See Comments)   Unknown    Peanut-containing Drug Products Other (See Comments)   Unknown         Medication List     STOP taking these medications    losartan 25 MG tablet Commonly known as: COZAAR       TAKE these medications    albuterol 108 (90 Base) MCG/ACT inhaler Commonly known as: VENTOLIN HFA SMARTSIG:1-2 Puff(s) By Mouth Every 6 Hours PRN   amiodarone 200 MG tablet Commonly known as: PACERONE Take by mouth.   apixaban 2.5 MG Tabs tablet Commonly known as: ELIQUIS Take by mouth.   aspirin EC 81 MG tablet Take 1 tablet (81 mg total) by mouth daily.   atorvastatin 20 MG tablet Commonly known as: LIPITOR Take 1 tablet (20 mg total) by mouth at bedtime. What changed:  medication strength how much to take   carvedilol 3.125  MG tablet Commonly known as: COREG Take 1 tablet (3.125 mg total) by mouth 2 (two) times daily.   clotrimazole-betamethasone cream Commonly known as: Lotrisone Apply 1 application topically 2 (two) times daily.   esomeprazole 40 MG capsule Commonly known as: NEXIUM Take 40 mg by mouth daily.   furosemide 40 MG tablet Commonly known as: LASIX Take by mouth.   triamcinolone cream 0.1 % Commonly known as: KENALOG Apply topically 2 (two) times daily.          Follow-up Information     Angelia Mould, MD Follow up in 6 month(s).   Specialties: Vascular Surgery, Cardiology Why: His office will call to make an appointment in 6 months for a repeat ultrasound. Contact information: [place] [address] [place] at [facility] Follow up.   Specialty: Rehabilitation Why: a referral has been electronically for you. they will call you to schedule an appointment or you may call for faster scheduling Contact information: [id number] [place]                TOTAL DISCHARGE TIME: 61 minutes  [facility] Pager on [email or web address]  03/29/2022, 10:48 AM

## 2022-04-02 ENCOUNTER — Ambulatory Visit: Payer: Medicare HMO | Admitting: Vascular Surgery

## 2022-04-06 DIAGNOSIS — Z79899 Other long term (current) drug therapy: Secondary | ICD-10-CM | POA: Diagnosis not present

## 2022-04-09 ENCOUNTER — Encounter: Payer: Self-pay | Admitting: Vascular Surgery

## 2022-04-09 ENCOUNTER — Ambulatory Visit: Payer: Medicare HMO | Admitting: Vascular Surgery

## 2022-04-09 VITALS — BP 160/104 | HR 75 | Temp 98.2°F | Resp 20 | Ht 70.0 in | Wt 271.0 lb

## 2022-04-09 DIAGNOSIS — I6521 Occlusion and stenosis of right carotid artery: Secondary | ICD-10-CM | POA: Diagnosis not present

## 2022-04-09 NOTE — Progress Notes (Signed)
ASSESSMENT & PLAN   MILD RIGHT CAROTID STENOSIS: This patient has a mild right carotid stenosis secondary to a focal calcific plaque.  By CT angiogram the stenosis looks fairly smooth.  By duplex end-diastolic velocity criteria the stenosis is less than 30%.  He did have some slurred speech and left-sided weakness.  The left-sided weakness has resolved.  Given that there is really currently no significant stenosis on the right I would not recommend considering surgery unless the stenosis progressed significantly or he developed recurrent right hemispheric symptoms.  I have ordered a follow-up carotid duplex scan in 6 months and I will see him back at that time.  He is on aspirin and is on a statin.  We have also discussed the importance of nutrition and exercise.  He is also on Eliquis for A-fib.  DIZZINESS: He does have issues with dizziness but both vertebral arteries are patent with antegrade flow and there is no evidence of vertebrobasilar insufficiency.  Likewise he has no significant extracranial carotid disease which would be responsible for his dizziness.  REASON FOR CONSULT:    Right carotid stenosis.  The consult is requested by [facility] hospitalist.  HPI:   William Matthews is a 72 y.o. male who was hospitalized from 03/26/2022 to 03/28/2022 after he presented with left facial droop and slurred speech.   My history the patient had been having some issues with slurred speech intermittently.  He presented to the emergency department on 03/26/2022 with some left facial droop.  His CT angiogram showed a mild stenosis in the right ICA secondary to a focal calcific plaque.  The stenosis was fairly smooth.  Duplex scan showed mild stenosis on the right.  He has an MRI showed no acute intracranial process with no evidence of acute or subacute infarct.  He comes in for an outpatient visit.  The patient today complains of some persistent issues with dizziness which she has had for some time.  He said no  focal weakness.  He states that his speech really has not changed much.  He is on aspirin and is on a statin.  He is on Eliquis for A-fib.  His past medical history is otherwise significant for hypertension, hypercholesterolemia, coronary artery disease, previous CABG, CKD 3, and a history of congestive heart failure.  He is also on Eliquis for paroxysmal atrial fibrillation.  Past Medical History:  Diagnosis Date   Anxiety    CAD (coronary artery disease)    a. s/p CABG 1994 with LIMA to D2 and LAD, SVG to D1 and ramus intermedius. b. acute inferior MI 2001 s/p rescue PTCA/stent to [facility].  b. NSTEMI 02/2014: s/p LHC with DES to oLCx c. 09/2015 PCI with DES to left main/ostial LCx   Chronic bronchitis ([place])    "they say I get it q yr" (10/03/2015)   CKD (chronic kidney disease), stage II    Depression    ED (erectile dysfunction)    GERD (gastroesophageal reflux disease)    Hyperlipidemia    Hypertension    Ischemic cardiomyopathy    a. EF 48% by nuc in 2012.   Myocardial infarction [facility]) "several"   Nephrolithiasis    Obesity    Prediabetes     Family History  Problem Relation Age of Onset   Coronary artery disease Mother        Died at age 21 of MI   Coronary artery disease Father        MI age 14  SOCIAL HISTORY: Social History   Tobacco Use   Smoking status: Never   Smokeless tobacco: Never  Substance Use Topics   Alcohol use: No    Allergies  Allergen Reactions   Ciprofloxacin Hcl Other (See Comments)    Unknown    Peanut-Containing Drug Products Other (See Comments)    Unknown     Current Outpatient Medications  Medication Sig Dispense Refill   albuterol (VENTOLIN HFA) 108 (90 Base) MCG/ACT inhaler SMARTSIG:1-2 Puff(s) By Mouth Every 6 Hours PRN     amiodarone (PACERONE) 200 MG tablet Take by mouth.     apixaban (ELIQUIS) 2.5 MG TABS tablet Take by mouth.     aspirin EC 81 MG tablet Take 1 tablet (81 mg total) by mouth daily. 90 tablet 3   atorvastatin  (LIPITOR) 20 MG tablet Take 1 tablet (20 mg total) by mouth at bedtime. 30 tablet 2   clotrimazole-betamethasone (LOTRISONE) cream Apply 1 application topically 2 (two) times daily. 60 g 0   esomeprazole (NEXIUM) 40 MG capsule Take 40 mg by mouth daily.     furosemide (LASIX) 40 MG tablet Take by mouth.     triamcinolone cream (KENALOG) 0.1 % Apply topically 2 (two) times daily.     carvedilol (COREG) 3.125 MG tablet Take 1 tablet (3.125 mg total) by mouth 2 (two) times daily. 180 tablet 4   No current facility-administered medications for this visit.    REVIEW OF SYSTEMS:  '[X]'$  denotes positive finding, '[ ]'$  denotes negative finding Cardiac  Comments:  Chest pain or chest pressure:    Shortness of breath upon exertion: x   Short of breath when lying flat: x   Irregular heart rhythm:        Vascular    Pain in calf, thigh, or hip brought on by ambulation: x   Pain in feet at night that wakes you up from your sleep:     Blood clot in your veins:    Leg swelling:         Pulmonary    Oxygen at home:    Productive cough:     Wheezing:         Neurologic    Sudden weakness in arms or legs:     Sudden numbness in arms or legs:     Sudden onset of difficulty speaking or slurred speech:    Temporary loss of vision in one eye:     Problems with dizziness:         Gastrointestinal    Blood in stool:     Vomited blood:         Genitourinary    Burning when urinating:     Blood in urine:        Psychiatric    Major depression:         Hematologic    Bleeding problems:    Problems with blood clotting too easily:        Skin    Rashes or ulcers:        Constitutional    Fever or chills:    -  PHYSICAL EXAM:   Vitals:   04/09/22 0928 04/09/22 0930  BP: (!) 156/95 (!) 160/104  Pulse: 75   Resp: 20   Temp: 98.2 F (36.8 C)   SpO2: 94%   Weight: 271 lb (122.9 kg)   Height: '5\' 10"'$  (1.778 m)    Body mass index is 38.88 kg/m. GENERAL: The patient is a well-nourished  male, in no acute distress. The vital signs are documented above. CARDIAC: There is a regular rate and rhythm.  VASCULAR: I do not detect carotid bruits. He has palpable femoral, popliteal, dorsalis pedis, and posterior tibial pulses bilaterally. He has some hyperpigmentation consistent with chronic venous insufficiency. PULMONARY: There is good air exchange bilaterally without wheezing or rales. ABDOMEN: Soft and non-tender with normal pitched bowel sounds.  MUSCULOSKELETAL: There are no major deformities. NEUROLOGIC: No focal weakness or paresthesias are detected. SKIN: There are no ulcers or rashes noted. PSYCHIATRIC: The patient has a normal affect.  DATA:    MRI: His MRI does not show an acute stroke.  CAROTID DUPLEX: I reviewed carotid duplex scan that was done while he was in the hospital.  He had a less than 39% carotid stenosis bilaterally.  Both vertebral arteries were patent with antegrade flow.  CT ANGIO NECK: I reviewed the CT angio of the neck that was done during his hospitalization.  This showed a focal calcific plaque that produced a mild stenosis in the proximal right ICA.  The stenosis was fairly smooth.  Deitra Mayo [facility]

## 2022-04-10 ENCOUNTER — Other Ambulatory Visit: Payer: Self-pay

## 2022-04-10 DIAGNOSIS — I6521 Occlusion and stenosis of right carotid artery: Secondary | ICD-10-CM

## 2022-04-13 DIAGNOSIS — N529 Male erectile dysfunction, unspecified: Secondary | ICD-10-CM | POA: Diagnosis not present

## 2022-04-13 DIAGNOSIS — R3129 Other microscopic hematuria: Secondary | ICD-10-CM | POA: Diagnosis not present

## 2022-04-13 DIAGNOSIS — R3915 Urgency of urination: Secondary | ICD-10-CM | POA: Diagnosis not present

## 2022-04-13 DIAGNOSIS — R338 Other retention of urine: Secondary | ICD-10-CM | POA: Diagnosis not present

## 2022-04-13 DIAGNOSIS — N401 Enlarged prostate with lower urinary tract symptoms: Secondary | ICD-10-CM | POA: Diagnosis not present

## 2022-04-13 DIAGNOSIS — C61 Malignant neoplasm of prostate: Secondary | ICD-10-CM | POA: Diagnosis not present

## 2022-04-13 DIAGNOSIS — R350 Frequency of micturition: Secondary | ICD-10-CM | POA: Diagnosis not present

## 2022-04-13 NOTE — Therapy (Incomplete)
OUTPATIENT OCCUPATIONAL THERAPY NEURO EVALUATION  Patient Name: William Matthews MRN: [id number] DOB:07/14/1950, 72 y.o., male Today's Date: 04/13/2022  PCP: Marland Kitchen REFERRING PROVIDER: Bonnielee Haff, MD   END OF SESSION:   Past Medical History:  Diagnosis Date   Anxiety    CAD (coronary artery disease)    a. s/p CABG 1994 with LIMA to D2 and LAD, SVG to D1 and ramus intermedius. b. acute inferior MI 2001 s/p rescue PTCA/stent to [facility].  b. NSTEMI 02/2014: s/p LHC with DES to oLCx c. 09/2015 PCI with DES to left main/ostial LCx   Chronic bronchitis ([place])    "they say I get it q yr" (10/03/2015)   CKD (chronic kidney disease), stage II    Depression    ED (erectile dysfunction)    GERD (gastroesophageal reflux disease)    Hyperlipidemia    Hypertension    Ischemic cardiomyopathy    a. EF 48% by nuc in 2012.   Myocardial infarction [facility]) "several"   Nephrolithiasis    Obesity    Prediabetes    Past Surgical History:  Procedure Laterality Date   CARDIAC CATHETERIZATION N/A 02/19/2015   Procedure: Left Heart Cath and Cors/Grafts Angiography;  Surgeon: Burnell Blanks, MD;  LAD & RCA 100%, LIMA-LAD 50%, SVG-OM-D1 100% stenosis, distal limb D1-OM ok, CFX 99%   CARDIAC CATHETERIZATION  02/19/2015   Procedure: Coronary Stent Intervention;  Surgeon: Burnell Blanks, MD; 3.5 x 16 mm Promus Premier DES to the ostial CFX    CARDIAC CATHETERIZATION N/A 10/03/2015   Procedure: Left Heart Cath and Cors/Grafts Angiography;  Surgeon: Peter M Martinique, MD;  Location: [place] [facility];  Service: Cardiovascular;  Laterality: N/A;   CARDIAC CATHETERIZATION N/A 10/03/2015   Procedure: Coronary Stent Intervention;  Surgeon: Peter M Martinique, MD;  Location: [place] [facility];  Service: Cardiovascular;  Laterality: N/A;  DES- Resolute 4.0x15 to distal left main into the ostial circumflex artery   CARDIAC CATHETERIZATION N/A 10/03/2015   Procedure: Intravascular Ultrasound/IVUS;  Surgeon: Peter M  Martinique, MD;  Location: [place] [facility];  Service: Cardiovascular;  Laterality: N/A;   CORONARY ANGIOPLASTY WITH STENT PLACEMENT  1994 - 2001 X 5   "stents each time"   CORONARY ANGIOPLASTY WITH STENT PLACEMENT  10/03/2015   CORONARY ARTERY BYPASS GRAFT  1994   LIMA-D2-LAD, SVG-D1-OM2   CYSTOSCOPY W/ STONE MANIPULATION     Patient Active Problem List   Diagnosis Date Noted   Slurred speech 03/26/2022   Chronic kidney disease, stage 3a ([place]) 03/26/2022   Hypothyroidism 03/26/2022   Paroxysmal atrial fibrillation ([place]) 03/26/2022   Heart failure with mildly reduced ejection fraction (HFmrEF) ([place]) 03/26/2022   Pain due to onychomycosis of toenails of both feet 08/23/2020   BPH with obstruction/lower urinary tract symptoms 12/18/2019   Anticoagulant long-term use 09/07/2019   Acute on chronic systolic congestive heart failure ([place]) 09/07/2019   Mitral regurgitation 09/07/2019   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   Atrial flutter ([place]) 08/15/2019   Unstable angina ([place]) 10/03/2015   NSTEMI (non-ST elevated myocardial infarction) ([place]) 02/20/2015   Hyperlipidemia    Prediabetes    Obesity    ED (erectile dysfunction)    CAD (coronary artery disease)    CKD (chronic kidney disease), stage II 02/18/2015   Nonspecific abnormal unspecified cardiovascular function study 08/11/2010   Unstable angina pectoris ([place]) 07/02/2010   Essential hypertension 07/02/2010   Erectile dysfunction 07/02/2010    ONSET DATE: 03/28/22 (referral date)   REFERRING  DIAG: R42 (ICD-10-CM) - Dizziness   THERAPY DIAG:  No diagnosis found.  Rationale for Evaluation and Treatment: Rehabilitation  SUBJECTIVE:   SUBJECTIVE STATEMENT: *** Pt accompanied by: {accompnied:27141}  PERTINENT HISTORY: 72 y.o. male with medical history significant for PAF on Eliquis, CAD s/p CABG and DES, CHF, CKD stage IIIa, HTN, HLD, hypothyroidism, BPH who presented to the ED 03/26/22 for evaluation of left  facial droop and slurred speech. Patient states he initially noted left-sided facial droop beginning at the end of December associated with slurred speech.  These changes resolved the next day.   ICA stenosis  MRI IMPRESSION: 1. No acute intracranial process. No evidence of acute or subacute infarct. 2. Ventriculomegaly, with crowding of the sulci near the vertex and an acute callosum angle, which can be seen in the setting of normal pressure hydrocephalus.  PRECAUTIONS: {Therapy precautions:24002}  WEIGHT BEARING RESTRICTIONS: {Yes ***/No:24003}  PAIN:  Are you having pain? {OPRCPAIN:27236}  FALLS: Has patient fallen in last 6 months? {fallsyesno:27318}  LIVING ENVIRONMENT: Lives with: {OPRC lives with:25569::"lives with their family"} Lives in: {Lives in:25570} Stairs: {opstairs:27293} Has following equipment at home: {Assistive devices:23999}  PLOF: {PLOF:24004}  PATIENT GOALS: ***  OBJECTIVE:   HAND DOMINANCE: {MISC; OT HAND [phone]}  ADLs: Overall ADLs: *** Transfers/ambulation related to ADLs: Eating: *** Grooming: *** UB Dressing: *** LB Dressing: *** Toileting: *** Bathing: *** Tub Shower transfers: *** Equipment: {equipment:25573}  IADLs: Shopping: *** Light housekeeping: *** Meal Prep: *** Community mobility: *** Medication management: *** Financial management: *** Handwriting: {OTWRITTENEXPRESSION:25361}  MOBILITY STATUS: {OTMOBILITY:25360}  POSTURE COMMENTS:  {posture:25561} Sitting balance: {sitting balance:25483}  ACTIVITY TOLERANCE: Activity tolerance: ***  FUNCTIONAL OUTCOME MEASURES: {OTFUNCTIONALMEASURES:27238}  UPPER EXTREMITY ROM:    {AROM/PROM:27142} ROM Right eval Left eval  Shoulder flexion    Shoulder abduction    Shoulder adduction    Shoulder extension    Shoulder internal rotation    Shoulder external rotation    Elbow flexion    Elbow extension    Wrist flexion    Wrist extension    Wrist ulnar  deviation    Wrist radial deviation    Wrist pronation    Wrist supination    (Blank rows = not tested)  UPPER EXTREMITY MMT:     MMT Right eval Left eval  Shoulder flexion    Shoulder abduction    Shoulder adduction    Shoulder extension    Shoulder internal rotation    Shoulder external rotation    Middle trapezius    Lower trapezius    Elbow flexion    Elbow extension    Wrist flexion    Wrist extension    Wrist ulnar deviation    Wrist radial deviation    Wrist pronation    Wrist supination    (Blank rows = not tested)  HAND FUNCTION: {handfunction:27230}  COORDINATION: {otcoordination:27237}  SENSATION: {sensation:27233}  EDEMA: ***  MUSCLE TONE: {UETONE:25567}  COGNITION: Overall cognitive status: {cognition:24006}  VISION: Subjective report: *** Baseline vision: {OTBASELINEVISION:25363} Visual history: {OTVISUALHISTORY:25364}  VISION ASSESSMENT: {visionassessment:27231}  Patient has difficulty with following activities due to following visual impairments: ***  PERCEPTION: {Perception:25564}  PRAXIS: {Praxis:25565}  OBSERVATIONS: ***   TODAY'S TREATMENT:  DATE: ***   PATIENT EDUCATION: Education details: *** Person educated: {Person educated:25204} Education method: {Education Method:25205} Education comprehension: {Education Comprehension:25206}  HOME EXERCISE PROGRAM: ***   GOALS: Goals reviewed with patient? {yes/no:20286}  SHORT TERM GOALS: Target date: ***  *** Baseline: Goal status: {GOALSTATUS:25110}  2.  *** Baseline:  Goal status: {GOALSTATUS:25110}  3.  *** Baseline:  Goal status: {GOALSTATUS:25110}  4.  *** Baseline:  Goal status: {GOALSTATUS:25110}  5.  *** Baseline:  Goal status: {GOALSTATUS:25110}  6.  *** Baseline:  Goal status: {GOALSTATUS:25110}  LONG TERM GOALS: Target  date: ***  *** Baseline:  Goal status: {GOALSTATUS:25110}  2.  *** Baseline:  Goal status: {GOALSTATUS:25110}  3.  *** Baseline:  Goal status: {GOALSTATUS:25110}  4.  *** Baseline:  Goal status: {GOALSTATUS:25110}  5.  *** Baseline:  Goal status: {GOALSTATUS:25110}  6.  *** Baseline:  Goal status: {GOALSTATUS:25110}  ASSESSMENT:  CLINICAL IMPRESSION: Patient is a *** y.o. *** who was seen today for occupational therapy evaluation for ***.   PERFORMANCE DEFICITS: in functional skills including {OT physical skills:25468}, cognitive skills including {OT cognitive skills:25469}, and psychosocial skills including {OT psychosocial skills:25470}.   IMPAIRMENTS: are limiting patient from {OT performance deficits:25471}.   CO-MORBIDITIES: {Comorbidities:25485} that affects occupational performance. Patient will benefit from skilled OT to address above impairments and improve overall function.  MODIFICATION OR ASSISTANCE TO COMPLETE EVALUATION: {OT modification:25474}  OT OCCUPATIONAL PROFILE AND HISTORY: {OT PROFILE AND HISTORY:25484}  CLINICAL DECISION MAKING: {OT CDM:25475}  REHAB POTENTIAL: {rehabpotential:25112}  EVALUATION COMPLEXITY: {Evaluation complexity:25115}    PLAN:  OT FREQUENCY: {rehab frequency:25116}  OT DURATION: {rehab duration:25117}  PLANNED INTERVENTIONS: {OT Interventions:25467}  RECOMMENDED OTHER SERVICES: ***  CONSULTED AND AGREED WITH PLAN OF CARE: [id number]  PLAN FOR NEXT SESSION: ***   Hans Eden, OT 04/13/2022, 2:57 PM

## 2022-04-15 ENCOUNTER — Ambulatory Visit: Payer: Medicare HMO | Attending: Internal Medicine | Admitting: Physical Therapy

## 2022-04-15 ENCOUNTER — Ambulatory Visit: Payer: Medicare HMO | Admitting: Rehabilitative and Restorative Service Providers"

## 2022-04-15 ENCOUNTER — Other Ambulatory Visit: Payer: Self-pay

## 2022-04-15 ENCOUNTER — Encounter: Payer: Self-pay | Admitting: Rehabilitative and Restorative Service Providers"

## 2022-04-15 DIAGNOSIS — R2681 Unsteadiness on feet: Secondary | ICD-10-CM | POA: Diagnosis present

## 2022-04-15 DIAGNOSIS — M6281 Muscle weakness (generalized): Secondary | ICD-10-CM | POA: Diagnosis not present

## 2022-04-15 DIAGNOSIS — I69354 Hemiplegia and hemiparesis following cerebral infarction affecting left non-dominant side: Secondary | ICD-10-CM

## 2022-04-15 DIAGNOSIS — R2689 Other abnormalities of gait and mobility: Secondary | ICD-10-CM | POA: Diagnosis present

## 2022-04-15 DIAGNOSIS — R42 Dizziness and giddiness: Secondary | ICD-10-CM | POA: Diagnosis present

## 2022-04-15 NOTE — Therapy (Signed)
OUTPATIENT OCCUPATIONAL THERAPY NEURO EVALUATION  Patient Name: William Matthews MRN: [id number] DOB:01/08/1951, 72 y.o., male Today's Date: 04/15/2022  PCP: N/A REFERRING PROVIDER: Bonnielee Haff, MD   END OF SESSION:  OT End of Session - 04/15/22 1404     Visit Number 1    Authorization Type Aetna Medicare    OT Start Time 1404    OT Stop Time 1444    OT Time Calculation (min) 40 min    Activity Tolerance Patient tolerated treatment well;Patient limited by fatigue;Patient limited by lethargy    Behavior During Therapy [facility] for tasks assessed/performed;Impulsive             Past Medical History:  Diagnosis Date   Anxiety    CAD (coronary artery disease)    a. s/p CABG 1994 with LIMA to D2 and LAD, SVG to D1 and ramus intermedius. b. acute inferior MI 2001 s/p rescue PTCA/stent to [facility].  b. NSTEMI 02/2014: s/p LHC with DES to oLCx c. 09/2015 PCI with DES to left main/ostial LCx   Chronic bronchitis ([place])    "they say I get it q yr" (10/03/2015)   CKD (chronic kidney disease), stage II    Depression    ED (erectile dysfunction)    GERD (gastroesophageal reflux disease)    Hyperlipidemia    Hypertension    Ischemic cardiomyopathy    a. EF 48% by nuc in 2012.   Myocardial infarction [facility]) "several"   Nephrolithiasis    Obesity    Prediabetes    Past Surgical History:  Procedure Laterality Date   CARDIAC CATHETERIZATION N/A 02/19/2015   Procedure: Left Heart Cath and Cors/Grafts Angiography;  Surgeon: Burnell Blanks, MD;  LAD & RCA 100%, LIMA-LAD 50%, SVG-OM-D1 100% stenosis, distal limb D1-OM ok, CFX 99%   CARDIAC CATHETERIZATION  02/19/2015   Procedure: Coronary Stent Intervention;  Surgeon: Burnell Blanks, MD; 3.5 x 16 mm Promus Premier DES to the ostial CFX    CARDIAC CATHETERIZATION N/A 10/03/2015   Procedure: Left Heart Cath and Cors/Grafts Angiography;  Surgeon: Peter M Martinique, MD;  Location: [place] CV LAB;  Service: Cardiovascular;  Laterality: N/A;    CARDIAC CATHETERIZATION N/A 10/03/2015   Procedure: Coronary Stent Intervention;  Surgeon: Peter M Martinique, MD;  Location: [place] CV LAB;  Service: Cardiovascular;  Laterality: N/A;  DES- Resolute 4.0x15 to distal left main into the ostial circumflex artery   CARDIAC CATHETERIZATION N/A 10/03/2015   Procedure: Intravascular Ultrasound/IVUS;  Surgeon: Peter M Martinique, MD;  Location: [place] CV LAB;  Service: Cardiovascular;  Laterality: N/A;   CORONARY ANGIOPLASTY WITH STENT PLACEMENT  1994 - 2001 X 5   "stents each time"   CORONARY ANGIOPLASTY WITH STENT PLACEMENT  10/03/2015   CORONARY ARTERY BYPASS GRAFT  1994   LIMA-D2-LAD, SVG-D1-OM2   CYSTOSCOPY W/ STONE MANIPULATION     Patient Active Problem List   Diagnosis Date Noted   Slurred speech 03/26/2022   Chronic kidney disease, stage 3a ([place]) 03/26/2022   Hypothyroidism 03/26/2022   Paroxysmal atrial fibrillation ([place]) 03/26/2022   Heart failure with mildly reduced ejection fraction (HFmrEF) ([place]) 03/26/2022   Pain due to onychomycosis of toenails of both feet 08/23/2020   BPH with obstruction/lower urinary tract symptoms 12/18/2019   Anticoagulant long-term use 09/07/2019   Acute on chronic systolic congestive heart failure ([place]) 09/07/2019   Mitral regurgitation 09/07/2019   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   Atrial flutter ([place]) 08/15/2019   Unstable angina ([place]) 10/03/2015  NSTEMI (non-ST elevated myocardial infarction) ([place]) 02/20/2015   Hyperlipidemia    Prediabetes    Obesity    ED (erectile dysfunction)    CAD (coronary artery disease)    CKD (chronic kidney disease), stage II 02/18/2015   Nonspecific abnormal unspecified cardiovascular function study 08/11/2010   Unstable angina pectoris ([place]) 07/02/2010   Essential hypertension 07/02/2010   Erectile dysfunction 07/02/2010    ONSET DATE: 03/26/22 (ED admit)   REFERRING DIAG: G45.9 (ICD-10-CM) - TIA (transient ischemic attack)   THERAPY  DIAG:  Hemiplegia and hemiparesis following cerebral infarction affecting left non-dominant side ([place])  Rationale for Evaluation and Treatment: Rehabilitation  SUBJECTIVE:   SUBJECTIVE STATEMENT: He states noticing having facial droop but going to sleep and not getting better.  He did check himself into the emergency room on 03/26/2022 where he was found to have a TIA.  Additionally he is supposed to follow-up with outpatient vascular surgery for ICA stenosis.  He states that he has been home from the hospital for a week and he cannot remember much before his hospitalization or during his hospitalization.  He drove himself here today.  He missed his PT appointment and states getting confused and not finding the correct building.  He states for the most part doing his basic ADLs since being home but has not done any IADLs except driving.  He states dizziness when he closes his eyes a left foot drop balance problems speech and memory problems.  Denies problems in his upper extremities.  PERTINENT HISTORY: medical history significant for PAF on Eliquis, CAD s/p CABG and DES, CHF, CKD stage IIIa, HTN, HLD, hypothyroidism, BPH   PRECAUTIONS: Fall and Other: poor cognition  WEIGHT BEARING RESTRICTIONS: No  PAIN:  Are you having pain? No  FALLS: Has patient fallen in last 6 months? Yes. Number of falls at least 1 or 2, cannot recall  LIVING ENVIRONMENT: Lives with: lives with their family Lives in: House/apartment Has following equipment at home: None  PLOF: Independent  PATIENT GOALS: He states that he gets dizzy with his eyes closed, his speech is poor, and his walking is poor.  He has no OT related goals today.   OBJECTIVE:   HAND DOMINANCE: Right  ADLs: Overall ADLs: Largely modified independent but also supervision for some poor gait/balance Transfers/ambulation related to ADLs: Eating: Mod I Grooming: Mod I UB Dressing: Mod I LB Dressing: needs help with socks, shoes due to  tight LEs Toileting: no help in past week Bathing: Mod I Equipment: none  IADLs: IADL status was difficult to determine as he states his family has been helping him in the past week and he does not remember what he used to be able to do before his stroke with some items.  He has not been out shopping or cooking his own meals but he has been driving himself which is likely unsafe.  MOBILITY STATUS: Hx of falls and decreased  POSTURE COMMENTS:  Leans right in seat today, also decreased standing/walking balance   ACTIVITY TOLERANCE: Activity tolerance: decreased in general and does fatigue   FUNCTIONAL OUTCOME MEASURES: Quick DASH was attempted, but he could not recall certain info. Generally he stated "mild" to "no" problems with upper extremity use today,but could not complete the exam due to memory issues.   UPPER EXTREMITY ROM:   Eval: No b/l UE overt loss of motion, no pain, no lack of sensation, etc.   UPPER EXTREMITY MMT:    Eval: b/l UEs tested [facility]  for strength, though the left was mildly weaker. The weakness was not a significant functional deficit today. Similar findings for b/l coordination and hand strength today  HAND FUNCTION: 72# Rt hand grip, 78# Lt hand grip   COORDINATION: 9 Hole Peg test: Right: 32 sec; Left: 30 sec  SENSATION: WFL  EDEMA: none  MUSCLE TONE: WFL b/l UEs  COGNITION: Overall cognitive status: Impaired OT proctors the SLUMS examination and he scores 18/30 "Dementia" level cognition, though he was "borderline" on some items, he at least likely has a mild cognitive impairment, if not worse.   VISION ASSESSMENT: WFL for visual fields, tracking, scanning, convergence, divergence, but he does have a "lazy eye" on Lt side of face (CVA side) that he claims is chronic today.   PERCEPTION: WFL  PRAXIS: WFL  OBSERVATIONS: Overall he seems to have some age-related stiffness bilaterally, his left side is slightly weaker than his right but not a  significant deficit now.  His coordination is a bit decreased bilaterally but not a significant safety concern or functional problem at this point.  Biggest concerns today are cognition related, gait and balance related, dizziness when he closes his eyes, slurred speech memory and speech problems.  These things would be better managed by other professionals   TODAY'S TREATMENT:                                                                                                                              DATE:  NONE   GOALS: N/A for OT    ASSESSMENT:  CLINICAL IMPRESSION: Patient is a 72 y.o. male who was seen today for occupational therapy evaluation for hemiparesis after stroke and he had apparent cognitive problems as well.  These cognition issues would be better treated by speech-language pathology as he also has speech-language difficulties.  His decreased gait and balance and vestibular complaints would be better managed by physical therapist.  He was recommended for safety to stop driving until cleared by the [facility] or a physician.  As he was showing signs of decreased cognition and memory as well as problem solving skills, OT contacts his son out of concern and relays this information to him as well.  PERFORMANCE DEFICITS: in functional skills including IADLs, mobility, balance, endurance, cardiopulmonary status limiting function, and vestibular, cognitive skills including attention, emotional, energy/drive, memory, problem solving, and safety awareness, and psychosocial skills including coping strategies, environmental adaptation, and routines and behaviors.   IMPAIRMENTS: are limiting patient from IADLs.   CO-MORBIDITIES: has co-morbidities such as PAF on Eliquis, CAD s/p CABG and DES, CHF, CKD stage IIIa, HTN, HLD, hypothyroidism, BPH  that affects occupational performance.   MODIFICATION OR ASSISTANCE TO COMPLETE EVALUATION: Min-Moderate modification of tasks or assist with assess  necessary to complete an evaluation.  OT OCCUPATIONAL PROFILE AND HISTORY: Problem focused assessment: Including review of records relating to presenting problem.  CLINICAL DECISION MAKING: LOW - limited treatment options, no task modification necessary  REHAB POTENTIAL: Poor OT rehab potential   EVALUATION COMPLEXITY: Low    PLAN:  RECOMMENDED OTHER SERVICES: SLP for speech and cognition as well as PT for gait and vestibular therapy highly recommended for his safety and function. He should also establish care with a primary care physician and potentially seek help with a cognitive specialist of some sort.  CONSULTED AND AGREED WITH PLAN OF CARE: Patient  PLAN FOR NEXT SESSION:  OT will not see for treatment, as SLP and PT are needed services.    Benito Mccreedy, OT 04/15/2022, 4:53 PM

## 2022-04-16 ENCOUNTER — Ambulatory Visit: Payer: Medicare HMO | Admitting: Physical Therapy

## 2022-04-16 DIAGNOSIS — R2681 Unsteadiness on feet: Secondary | ICD-10-CM

## 2022-04-16 DIAGNOSIS — I69354 Hemiplegia and hemiparesis following cerebral infarction affecting left non-dominant side: Secondary | ICD-10-CM

## 2022-04-16 DIAGNOSIS — R42 Dizziness and giddiness: Secondary | ICD-10-CM

## 2022-04-16 DIAGNOSIS — M6281 Muscle weakness (generalized): Secondary | ICD-10-CM

## 2022-04-16 DIAGNOSIS — R2689 Other abnormalities of gait and mobility: Secondary | ICD-10-CM

## 2022-04-16 NOTE — Therapy (Signed)
OUTPATIENT PHYSICAL THERAPY NEURO EVALUATION   Patient Name: William Matthews MRN: [id number] DOB:05-26-1950, 72 y.o., male Today's Date: 04/17/2022   PCP: Arthur Holms, NP REFERRING PROVIDER: Bonnielee Haff, MD  END OF SESSION:  PT End of Session - 04/17/22 1201     Visit Number 1    Number of Visits 9    Date for PT Re-Evaluation 05/15/22    Authorization Type Aetna Medicare    Authorization Time Period 04-16-22 - 06-09-22    PT Start Time 1103    PT Stop Time 1146    PT Time Calculation (min) 43 min    Equipment Utilized During Treatment Gait belt    Activity Tolerance Patient tolerated treatment well    Behavior During Therapy WFL for tasks assessed/performed             Past Medical History:  Diagnosis Date   Anxiety    CAD (coronary artery disease)    a. s/p CABG 1994 with LIMA to D2 and LAD, SVG to D1 and ramus intermedius. b. acute inferior MI 2001 s/p rescue PTCA/stent to [facility].  b. NSTEMI 02/2014: s/p LHC with DES to oLCx c. 09/2015 PCI with DES to left main/ostial LCx   Chronic bronchitis ([place])    "they say I get it q yr" (10/03/2015)   CKD (chronic kidney disease), stage II    Depression    ED (erectile dysfunction)    GERD (gastroesophageal reflux disease)    Hyperlipidemia    Hypertension    Ischemic cardiomyopathy    a. EF 48% by nuc in 2012.   Myocardial infarction [facility]) "several"   Nephrolithiasis    Obesity    Prediabetes    Past Surgical History:  Procedure Laterality Date   CARDIAC CATHETERIZATION N/A 02/19/2015   Procedure: Left Heart Cath and Cors/Grafts Angiography;  Surgeon: Burnell Blanks, MD;  LAD & RCA 100%, LIMA-LAD 50%, SVG-OM-D1 100% stenosis, distal limb D1-OM ok, CFX 99%   CARDIAC CATHETERIZATION  02/19/2015   Procedure: Coronary Stent Intervention;  Surgeon: Burnell Blanks, MD; 3.5 x 16 mm Promus Premier DES to the ostial CFX    CARDIAC CATHETERIZATION N/A 10/03/2015   Procedure: Left Heart Cath and Cors/Grafts Angiography;   Surgeon: Peter M Martinique, MD;  Location: [place] CV LAB;  Service: Cardiovascular;  Laterality: N/A;   CARDIAC CATHETERIZATION N/A 10/03/2015   Procedure: Coronary Stent Intervention;  Surgeon: Peter M Martinique, MD;  Location: [place] CV LAB;  Service: Cardiovascular;  Laterality: N/A;  DES- Resolute 4.0x15 to distal left main into the ostial circumflex artery   CARDIAC CATHETERIZATION N/A 10/03/2015   Procedure: Intravascular Ultrasound/IVUS;  Surgeon: Peter M Martinique, MD;  Location: [place] CV LAB;  Service: Cardiovascular;  Laterality: N/A;   CORONARY ANGIOPLASTY WITH STENT PLACEMENT  1994 - 2001 X 5   "stents each time"   CORONARY ANGIOPLASTY WITH STENT PLACEMENT  10/03/2015   CORONARY ARTERY BYPASS GRAFT  1994   LIMA-D2-LAD, SVG-D1-OM2   CYSTOSCOPY W/ STONE MANIPULATION     Patient Active Problem List   Diagnosis Date Noted   Slurred speech 03/26/2022   Chronic kidney disease, stage 3a ([place]) 03/26/2022   Hypothyroidism 03/26/2022   Paroxysmal atrial fibrillation ([place]) 03/26/2022   Heart failure with mildly reduced ejection fraction (HFmrEF) ([place]) 03/26/2022   Pain due to onychomycosis of toenails of both feet 08/23/2020   BPH with obstruction/lower urinary tract symptoms 12/18/2019   Anticoagulant long-term use 09/07/2019   Acute on chronic  systolic congestive heart failure ([place]) 09/07/2019   Mitral regurgitation 09/07/2019   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   Atrial flutter ([place]) 08/15/2019   Unstable angina ([place]) 10/03/2015   NSTEMI (non-ST elevated myocardial infarction) ([place]) 02/20/2015   Hyperlipidemia    Prediabetes    Obesity    ED (erectile dysfunction)    CAD (coronary artery disease)    CKD (chronic kidney disease), stage II 02/18/2015   Nonspecific abnormal unspecified cardiovascular function study 08/11/2010   Unstable angina pectoris ([place]) 07/02/2010   Essential hypertension 07/02/2010   Erectile dysfunction 07/02/2010    ONSET DATE:  03-26-22  REFERRING DIAG:  Diagnosis  G45.9 (ICD-10-CM) - TIA (transient ischemic attack)    THERAPY DIAG:  Hemiplegia and hemiparesis following cerebral infarction affecting left non-dominant side ([place]) - Plan: PT plan of care cert/re-cert  Other abnormalities of gait and mobility - Plan: PT plan of care cert/re-cert  Dizziness and giddiness - Plan: PT plan of care cert/re-cert  Unsteadiness on feet - Plan: PT plan of care cert/re-cert  Muscle weakness (generalized) - Plan: PT plan of care cert/re-cert  Rationale for Evaluation and Treatment: Rehabilitation  SUBJECTIVE:                                                                                                                                                                                             SUBJECTIVE STATEMENT: Pt reports dizziness daily; reports he really has dizziness when he showers and closes eyes  Pt accompanied by: family member daughter, Claiborne Billings   PERTINENT HISTORY:  72 y.o. male with medical history significant for PAF on Eliquis, CAD s/p CABG and DES, CHF, CKD stage IIIa, HTN, HLD, hypothyroidism, BPH who presented to the ED for evaluation of left facial droop and slurred speech. Patient states he initially noted left-sided facial droop beginning at the end of December associated with slurred speech.  These changes resolved the next day.  Since then he has had intermittent episodes of dizziness without syncope.  Patient was hospitalized for further management. (2-15 - 03-28-22)    PAIN:  Are you having pain? No  PRECAUTIONS: intermittent dizziness due to stenosis: Fall   WEIGHT BEARING RESTRICTIONS: No  FALLS: Has patient fallen in last 6 months? Yes. Number of falls approx. 15  LIVING ENVIRONMENT: Lives with: lives alone Lives in: House/apartment Stairs: No Has following equipment at home: None  PLOF: Independent  PATIENT GOALS: Improve walking and balance   OBJECTIVE:   DIAGNOSTIC FINDINGS: MRI  negative for acute stroke. Echocardiogram shows normal systolic function.  No significant valvular abnormalities noted.  COGNITION: Overall cognitive status: Impaired  SENSATION: WFL  COORDINATION: WFL's bil. Ue's and LE's   POSTURE: rounded shoulders, forward head, increased thoracic kyphosis, and flexed trunk   LOWER EXTREMITY ROM:   WFL's bil. LE's   LOWER EXTREMITY MMT:    MMT Right Eval Left Eval  Hip flexion  4  Hip extension    Hip abduction  3+  Hip adduction    Hip internal rotation    Hip external rotation    Knee flexion  4  Knee extension  5  Ankle dorsiflexion  4  Ankle plantarflexion  3+  Ankle inversion    Ankle eversion    (Blank rows = not tested   BED MOBILITY:  Independent  TRANSFERS: Assistive device utilized: None  Sit to stand: Modified independence Stand to sit: Modified independence   GAIT: Gait pattern: step through pattern, decreased ankle dorsiflexion- Left, and Left foot flat Distance walked: 100' Assistive device utilized: None Level of assistance: SBA Comments: pt had one occurrence of tripping due to incomplete Lt foot clearance when walking from gym to restroom - pt able to recover balance independently  FUNCTIONAL TESTS:  5 times sit to stand: pt performed 4 reps of sit to stand 19.5 secs without UE support from chair Timed up and go (TUG): 13.5 secs 10 meter walk test: 11.78 secs without device = 2.78 ft/sec without device  PATIENT SURVEYS:  N/A  for diagnosis of TIA  TODAY'S TREATMENT:                                                                                                                              DATE: 04-16-22  Access Code: [facility] URL: [email or web address] Date: 04/17/2022 Prepared by: Ethelene Browns  Exercises - Sidelying Hip Abduction  - 1 x daily - 7 x weekly - 3 sets - 10 reps - Standing Heel Raise with Support  - 1 x daily - 7 x weekly - 3 sets - 10 reps - Single Leg Heel Raise  -  1 x daily - 7 x weekly - 3 sets - 10 reps - Heel Toe Raises with Counter Support  - 1 x daily - 7 x weekly - 3 sets - 10 reps   PATIENT EDUCATION: Education details: eval results and POC:  Medbridge HEP issued Person educated: Patient and Child(ren) Education method: Explanation, Demonstration, and Handouts Education comprehension: verbalized understanding and returned demonstration  HOME EXERCISE PROGRAM: Med[employer] HEP  [place]   GOALS: Goals reviewed with patient? Yes  SHORT TERM GOALS: Same as LTG's   LONG TERM GOALS: Target date: 05-15-22  Perform 5x sit to stand transfers in </= 15 secs to demo increased LE strength and improved balance upon initial standing.  Baseline: 19.5 secs (4 reps) Goal status: INITIAL  2.  Improve TUG score to </= 12.5 secs to reduce fall risk and demo improved functional mobility. Baseline:  13.5 secs with no device Goal status: INITIAL  3.  Increase gait speed to >/=  3.2 ft/sec without device for increased gait efficiency. Baseline: 11.78 secs = 2.78 ft/sec no device Goal status: INITIAL  4.  Amb.  500' on flat, even surface without LOB and without Lt foot catching for improved safety with community ambulation/accessibility. Baseline:  Goal status: INITIAL  5.  Independent in HEP for LLE strengthening and balance. Baseline:  Goal status: INITIAL  ASSESSMENT:  CLINICAL IMPRESSION: Patient is a 72 y.o. gentleman who was seen today for physical therapy evaluation and treatment for LLE weakness, gait and balance impairments due to TIA on 03-25-22. Pt's TUG score = 13.5 secs without use of assistive device, borderline for fall risk.  Pt is currently not using assistive device for ambulation but had one occurrence during initial eval of incomplete Lt foot clearance, resulting in stumbling gait - pt did recover balance independently.  Pt presents with weakness in Lt hip abductors and plantarflexors.  Pt will benefit from PT to address balance, gait  and strength impairments.     OBJECTIVE IMPAIRMENTS: Abnormal gait, decreased activity tolerance, decreased balance, decreased cognition, and decreased strength.   ACTIVITY LIMITATIONS: bending, squatting, stairs, transfers, and locomotion level  PARTICIPATION LIMITATIONS: cleaning, laundry, driving, shopping, community activity, and yard work  PERSONAL FACTORS: Fitness, Past/current experiences, and 1 comorbidity: carotid stenosis (dizziness)  are also affecting patient's functional outcome.   REHAB POTENTIAL: Good  CLINICAL DECISION MAKING: Stable/uncomplicated  EVALUATION COMPLEXITY: Low  PLAN:  PT FREQUENCY: 2x/week  PT DURATION: 4 weeks  PLANNED INTERVENTIONS: Therapeutic exercises, Therapeutic activity, Neuromuscular re-education, Balance training, Gait training, Patient/Family education, Self Care, Stair training, and DME instructions  PLAN FOR NEXT SESSION: check HEP for any questions; cont. Balance, strengthening and gait; SLS on each leg   Avik Leoni, [place], PT 04/17/2022, 6:18 PM

## 2022-04-17 ENCOUNTER — Encounter: Payer: Self-pay | Admitting: Physical Therapy

## 2022-04-20 ENCOUNTER — Ambulatory Visit: Payer: Medicare HMO | Admitting: Physical Therapy

## 2022-04-20 DIAGNOSIS — I69354 Hemiplegia and hemiparesis following cerebral infarction affecting left non-dominant side: Secondary | ICD-10-CM

## 2022-04-20 DIAGNOSIS — R2681 Unsteadiness on feet: Secondary | ICD-10-CM

## 2022-04-20 DIAGNOSIS — M6281 Muscle weakness (generalized): Secondary | ICD-10-CM | POA: Diagnosis not present

## 2022-04-20 DIAGNOSIS — R2689 Other abnormalities of gait and mobility: Secondary | ICD-10-CM

## 2022-04-20 NOTE — Therapy (Signed)
OUTPATIENT PHYSICAL THERAPY NEURO TREATMENT NOTE   Patient Name: William Matthews MRN: [id number] DOB:November 22, 1950, 72 y.o., male Today's Date: 04/21/2022   PCP: William Holms, NP REFERRING PROVIDER: Bonnielee Haff, MD  END OF SESSION:  PT End of Session - 04/21/22 1635     Visit Number 2    Number of Visits 9    Date for PT Re-Evaluation 05/15/22    Authorization Type [employer]na Medicare    Authorization Time Period 04-16-22 - 06-09-22    PT Start Time 0935    PT Stop Time 1015    PT Time Calculation (min) 40 min    Equipment Utilized During Treatment Gait belt    Activity Tolerance Patient tolerated treatment well    Behavior During Therapy WFL for tasks assessed/performed              Past Medical History:  Diagnosis Date   Anxiety    CAD (coronary artery disease)    a. s/p CABG 1994 with LIMA to D2 and LAD, SVG to D1 and ramus intermedius. b. acute inferior MI 2001 s/p rescue PTCA/stent to [facility].  b. NSTEMI 02/2014: s/p LHC with DES to oLCx c. 09/2015 PCI with DES to left main/ostial LCx   Chronic bronchitis ([place])    "they say I get it q yr" (10/03/2015)   CKD (chronic kidney disease), stage II    Depression    ED (erectile dysfunction)    GERD (gastroesophageal reflux disease)    Hyperlipidemia    Hypertension    Ischemic cardiomyopathy    a. EF 48% by nuc in 2012.   Myocardial infarction [facility]) "several"   Nephrolithiasis    Obesity    Prediabetes    Past Surgical History:  Procedure Laterality Date   CARDIAC CATHETERIZATION N/A 02/19/2015   Procedure: Left Heart Cath and Cors/Grafts Angiography;  Surgeon: William Blanks, MD;  LAD & RCA 100%, LIMA-LAD 50%, SVG-OM-D1 100% stenosis, distal limb D1-OM ok, CFX 99%   CARDIAC CATHETERIZATION  02/19/2015   Procedure: Coronary Stent Intervention;  Surgeon: William Blanks, MD; 3.5 x 16 mm Promus Premier DES to the ostial CFX    CARDIAC CATHETERIZATION N/A 10/03/2015   Procedure: Left Heart Cath and Cors/Grafts  Angiography;  Surgeon: William M Martinique, MD;  Location: [place] [facility];  Service: Cardiovascular;  Laterality: N/A;   CARDIAC CATHETERIZATION N/A 10/03/2015   Procedure: Coronary Stent Intervention;  Surgeon: William M Martinique, MD;  Location: [place] [facility];  Service: Cardiovascular;  Laterality: N/A;  DES- Resolute 4.0x15 to distal left main into the ostial circumflex artery   CARDIAC CATHETERIZATION N/A 10/03/2015   Procedure: Intravascular Ultrasound/IVUS;  Surgeon: William M Martinique, MD;  Location: [place] [facility];  Service: Cardiovascular;  Laterality: N/A;   CORONARY ANGIOPLASTY WITH STENT PLACEMENT  1994 - 2001 X 5   "stents each time"   CORONARY ANGIOPLASTY WITH STENT PLACEMENT  10/03/2015   CORONARY ARTERY BYPASS GRAFT  1994   LIMA-D2-LAD, SVG-D1-OM2   CYSTOSCOPY W/ STONE MANIPULATION     Patient Active Problem List   Diagnosis Date Noted   Slurred speech 03/26/2022   Chronic kidney disease, stage 3a ([place]) 03/26/2022   Hypothyroidism 03/26/2022   Paroxysmal atrial fibrillation ([place]) 03/26/2022   Heart failure with mildly reduced ejection fraction (HFmrEF) ([place]) 03/26/2022   Pain due to onychomycosis of toenails of both feet 08/23/2020   BPH with obstruction/lower urinary tract symptoms 12/18/2019   Anticoagulant long-term use 09/07/2019   Acute  on chronic systolic congestive heart failure ([place]) 09/07/2019   Mitral regurgitation 09/07/2019   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   Atrial flutter ([place]) 08/15/2019   Unstable angina ([place]) 10/03/2015   NSTEMI (non-ST elevated myocardial infarction) ([place]) 02/20/2015   Hyperlipidemia    Prediabetes    Obesity    ED (erectile dysfunction)    CAD (coronary artery disease)    CKD (chronic kidney disease), stage II 02/18/2015   Nonspecific abnormal unspecified cardiovascular function study 08/11/2010   Unstable angina pectoris ([place]) 07/02/2010   Essential hypertension 07/02/2010   Erectile dysfunction 07/02/2010     ONSET DATE: 03-26-22  REFERRING DIAG:  Diagnosis  G45.9 (ICD-10-CM) - TIA (transient ischemic attack)    THERAPY DIAG:  Hemiplegia and hemiparesis following cerebral infarction affecting left non-dominant side (HCC)  Other abnormalities of gait and mobility  Unsteadiness on feet  Rationale for Evaluation and Treatment: Rehabilitation  SUBJECTIVE:                                                                                                                                                                                             SUBJECTIVE STATEMENT: Pt reports he has not done the exercises yet - hasn't taken time to do them, but plans to do so.  Pt reports he has stumbled a few times but has caught himself - has not fallen  Pt accompanied by: family member William Matthews  PERTINENT HISTORY:  72 y.o. male with medical history significant for PAF on Eliquis, CAD s/p CABG and DES, CHF, CKD stage IIIa, HTN, HLD, hypothyroidism, BPH who presented to the ED for evaluation of left facial droop and slurred speech. Patient states he initially noted left-sided facial droop beginning at the end of December associated with slurred speech.  These changes resolved the next day.  Since then he has had intermittent episodes of dizziness without syncope.  Patient was hospitalized for further management. (2-15 - 03-28-22)    PAIN:  Are you having pain? No  PRECAUTIONS: intermittent dizziness due to stenosis: Fall   WEIGHT BEARING RESTRICTIONS: No  FALLS: Has patient fallen in last 6 months? Yes. Number of falls approx. 15  LIVING ENVIRONMENT: Lives with: lives alone Lives in: House/apartment Stairs: No Has following equipment at home: None  PLOF: Independent  PATIENT GOALS: Improve walking and balance   OBJECTIVE:   GAIT: Gait pattern: step through pattern, decreased ankle dorsiflexion- Left, and Left foot flat Distance walked: 100' Assistive device utilized: None Level of assistance:  SBA Comments: pt had one occurrence of tripping due to incomplete Lt foot clearance when walking from gym to restroom -  pt able to recover balance independently  TODAY'S TREATMENT:                                                                                                                              DATE: 04-21-22   THEREX: Sit to stand from mat 5 reps without UE support  Pt performed LLE strengthening exercises - bridging x 5 reps;  SLR LLE 10 reps;  hip abduction in Rt sidelying 10 reps; clamshell with red theraband 10 reps Standing hip extension LLE 10 reps; hip abduction 10 reps; (both with red theraband); LLE fatigued so hip flexion in standing was not performed Standing marching without resistance 10 reps each leg Stretches at counter - runner's stretch for hamstring/heel cord stretching bil. LE's 20 sec hold:  gastroc stretch with Lt forefoot placed on bottom shelf of cabinet 20 sec hold x 1 rep Seated LAQ LLE 10 reps 3 sec hold; seated Lt hamstring strengthening with red theraband 10 reps with 3 sec hold  Pt instructed in HEP for LLE strengthening; Medbridge Access Code: [email or web address] URL: [email or web address] Date: 04/21/2022 Prepared by: Ethelene Browns  Exercises - Sit to Stand  - 1 x daily - 7 x weekly - 1 sets - 10 reps - Clamshell with Resistance  - 1 x daily - 7 x weekly - 1 sets - 10 reps - 3 sec hold - Standing Hip Abduction with Resistance at Ankles and Counter Support  - 1 x daily - 7 x weekly - 1 sets - 10 reps - Standing Hip Extension with Resistance at Ankles and Unilateral Counter Support  - 1 x daily - 7 x weekly - 1 sets - 10 reps - Standing Hip Flexion with Resistance Loop  - 1 x daily - 7 x weekly - 1 sets - 10 reps - 3 sec hold - Standing March with Counter Support  - 1 x daily - 7 x weekly - 1 sets - 10 reps - 3 hold - Standing Gastroc Stretch at Counter  - 1 x daily - 7 x weekly - 1 sets - 1 reps - 30 sec hold - Standing Bilateral Gastroc Stretch  with Step  - 1 x daily - 7 x weekly - 1 sets - 1 reps - 20-30 hold   Access Code: [facility] URL: [email or web address] Date: 04/17/2022 Prepared by: Ethelene Browns  Exercises - Sidelying Hip Abduction  - 1 x daily - 7 x weekly - 3 sets - 10 reps - Standing Heel Raise with Support  - 1 x daily - 7 x weekly - 3 sets - 10 reps - Single Leg Heel Raise  - 1 x daily - 7 x weekly - 3 sets - 10 reps - Heel Toe Raises with Counter Support  - 1 x daily - 7 x weekly - 3 sets - 10 reps   PATIENT EDUCATION: Education details: eval results and POC:  Medbridge HEP issued Person educated: Patient and Child(ren) Education  method: Explanation, Demonstration, and Handouts Education comprehension: verbalized understanding and returned demonstration  HOME EXERCISE PROGRAM: Medbridge HEP  [place]   GOALS: Goals reviewed with patient? Yes  SHORT TERM GOALS: Same as LTG's   LONG TERM GOALS: Target date: 05-15-22  Perform 5x sit to stand transfers in </= 15 secs to demo increased LE strength and improved balance upon initial standing.  Baseline: 19.5 secs (4 reps) Goal status: INITIAL  2.  Improve TUG score to </= 12.5 secs to reduce fall risk and demo improved functional mobility. Baseline:  13.5 secs with no device Goal status: INITIAL  3.  Increase gait speed to >/= 3.2 ft/sec without device for increased gait efficiency. Baseline: 11.78 secs = 2.78 ft/sec no device Goal status: INITIAL  4.  Amb.  500' on flat, even surface without LOB and without Lt foot catching for improved safety with community ambulation/accessibility. Baseline:  Goal status: INITIAL  5.  Independent in HEP for LLE strengthening and balance. Baseline:  Goal status: INITIAL  ASSESSMENT:  CLINICAL IMPRESSION: PT session focused on establishing HEP with review of previously issued exercises for LLE strengthening and stretching, as pt reported he had not yet performed these exercises at home.  Pt fatigued  with performing standing hip exercises with use of red theraband for resistance, therefore, standing hip flexion with resistance was not performed as pt needed seated rest period.  Pt has decreased muscle endurance LLE and decreased activity tolerance.  Cont with POC.     OBJECTIVE IMPAIRMENTS: Abnormal gait, decreased activity tolerance, decreased balance, decreased cognition, and decreased strength.   ACTIVITY LIMITATIONS: bending, squatting, stairs, transfers, and locomotion level  PARTICIPATION LIMITATIONS: cleaning, laundry, driving, shopping, community activity, and yard work  PERSONAL FACTORS: Fitness, Past/current experiences, and 1 comorbidity: carotid stenosis (dizziness)  are also affecting patient's functional outcome.   REHAB POTENTIAL: Good  CLINICAL DECISION MAKING: Stable/uncomplicated  EVALUATION COMPLEXITY: Low  PLAN:  PT FREQUENCY: 2x/week  PT DURATION: 4 weeks  PLANNED INTERVENTIONS: Therapeutic exercises, Therapeutic activity, Neuromuscular re-education, Balance training, Gait training, Patient/Family education, Self Care, Stair training, and DME instructions  PLAN FOR NEXT SESSION: check HEP for any questions; cont. Balance, strengthening and gait; SLS on each leg   Laurali Goddard, [place], PT 04/21/2022, 4:43 PM

## 2022-04-21 ENCOUNTER — Encounter: Payer: Self-pay | Admitting: Physical Therapy

## 2022-04-22 DIAGNOSIS — Z79899 Other long term (current) drug therapy: Secondary | ICD-10-CM | POA: Diagnosis not present

## 2022-04-22 DIAGNOSIS — E559 Vitamin D deficiency, unspecified: Secondary | ICD-10-CM | POA: Diagnosis not present

## 2022-04-24 ENCOUNTER — Encounter: Payer: Self-pay | Admitting: Neurology

## 2022-04-28 ENCOUNTER — Ambulatory Visit: Payer: Medicare HMO | Admitting: Physical Therapy

## 2022-04-28 DIAGNOSIS — M6281 Muscle weakness (generalized): Secondary | ICD-10-CM | POA: Diagnosis not present

## 2022-04-28 NOTE — Therapy (Unsigned)
OUTPATIENT PHYSICAL THERAPY NEURO TREATMENT NOTE   Patient Name: William Matthews: [id number] DOB:[birth year], 72 y.o., male Today's Date: 04/29/2022   PCP: Arthur Holms, NP REFERRING PROVIDER: Bonnielee Haff, MD  END OF SESSION:  PT End of Session - 04/29/22 1728     Visit Number 3    Number of Visits 9    Date for PT Re-Evaluation 05/15/22    Authorization Type Aetna Medicare    Authorization Time Period 04-16-22 - 06-09-22    PT Start Time 1146    PT Stop Time 1235    PT Time Calculation (min) 49 min    Equipment Utilized During Treatment Gait belt    Activity Tolerance Patient tolerated treatment well    Behavior During Therapy WFL for tasks assessed/performed               Past Medical History:  Diagnosis Date   Anxiety    CAD (coronary artery disease)    a. s/p CABG 1994 with LIMA to D2 and LAD, SVG to D1 and ramus intermedius. b. acute inferior MI 2001 s/p rescue PTCA/stent to [facility].  b. NSTEMI 02/2014: s/p LHC with DES to oLCx c. 09/2015 PCI with DES to left main/ostial LCx   Chronic bronchitis ([place])    "they say I get it q yr" (10/03/2015)   CKD (chronic kidney disease), stage II    Depression    ED (erectile dysfunction)    GERD (gastroesophageal reflux disease)    Hyperlipidemia    Hypertension    Ischemic cardiomyopathy    a. EF 48% by nuc in 2012.   Myocardial infarction [facility]) "several"   Nephrolithiasis    Obesity    Prediabetes    Past Surgical History:  Procedure Laterality Date   CARDIAC CATHETERIZATION N/A 02/19/2015   Procedure: Left Heart Cath and Cors/Grafts Angiography;  Surgeon: Burnell Blanks, MD;  LAD & RCA 100%, LIMA-LAD 50%, SVG-OM-D1 100% stenosis, distal limb D1-OM ok, CFX 99%   CARDIAC CATHETERIZATION  02/19/2015   Procedure: Coronary Stent Intervention;  Surgeon: Burnell Blanks, MD; 3.5 x 16 mm Promus Premier DES to the ostial CFX    CARDIAC CATHETERIZATION N/A 10/03/2015   Procedure: Left Heart Cath and Cors/Grafts  Angiography;  Surgeon: Peter M Martinique, MD;  Location: [place] [facility];  Service: Cardiovascular;  Laterality: N/A;   CARDIAC CATHETERIZATION N/A 10/03/2015   Procedure: Coronary Stent Intervention;  Surgeon: Peter M Martinique, MD;  Location: [place] [facility];  Service: Cardiovascular;  Laterality: N/A;  DES- Resolute 4.0x15 to distal left main into the ostial circumflex artery   CARDIAC CATHETERIZATION N/A 10/03/2015   Procedure: Intravascular Ultrasound/IVUS;  Surgeon: Peter M Martinique, MD;  Location: [place] [facility];  Service: Cardiovascular;  Laterality: N/A;   CORONARY ANGIOPLASTY WITH STENT PLACEMENT  1994 - 2001 X 5   "stents each time"   CORONARY ANGIOPLASTY WITH STENT PLACEMENT  10/03/2015   CORONARY ARTERY BYPASS GRAFT  1994   LIMA-D2-LAD, SVG-D1-OM2   CYSTOSCOPY W/ STONE MANIPULATION     Patient Active Problem List   Diagnosis Date Noted   Slurred speech 03/26/2022   Chronic kidney disease, stage 3a ([place]) 03/26/2022   Hypothyroidism 03/26/2022   Paroxysmal atrial fibrillation ([place]) 03/26/2022   Heart failure with mildly reduced ejection fraction (HFmrEF) ([place]) 03/26/2022   Pain due to onychomycosis of toenails of both feet 08/23/2020   BPH with obstruction/lower urinary tract symptoms 12/18/2019   Anticoagulant long-term use 09/07/2019  Acute on chronic systolic congestive heart failure ([place]) 09/07/2019   Mitral regurgitation 09/07/2019   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   Atrial flutter ([place]) 08/15/2019   Unstable angina ([place]) 10/03/2015   NSTEMI (non-ST elevated myocardial infarction) ([place]) 02/20/2015   Hyperlipidemia    Prediabetes    Obesity    ED (erectile dysfunction)    CAD (coronary artery disease)    CKD (chronic kidney disease), stage II 02/18/2015   Nonspecific abnormal unspecified cardiovascular function study 08/11/2010   Unstable angina pectoris ([place]) 07/02/2010   Essential hypertension 07/02/2010   Erectile dysfunction 07/02/2010     ONSET DATE: 03-26-22  REFERRING DIAG:  Diagnosis  G45.9 (ICD-10-CM) - TIA (transient ischemic attack)    THERAPY DIAG:  Muscle weakness (generalized)  Rationale for Evaluation and Treatment: Rehabilitation  SUBJECTIVE:                                                                                                                                                                                             SUBJECTIVE STATEMENT: Pt reports he has not done the exercises at home - "been busy" - pt states he has mostly been walking a lot around his house; pt's son, Efford Denio., accompanying pt to PT stating he wants to see the exercises being done so that he can help pt do them at home; son asks for another printout of HEP  Pt accompanied by: family member son and Daughter  PERTINENT HISTORY:  72 y.o. male with medical history significant for PAF on Eliquis, CAD s/p CABG and DES, CHF, CKD stage IIIa, HTN, HLD, hypothyroidism, BPH who presented to the ED for evaluation of left facial droop and slurred speech. Patient states he initially noted left-sided facial droop beginning at the end of December associated with slurred speech.  These changes resolved the next day.  Since then he has had intermittent episodes of dizziness without syncope.  Patient was hospitalized for further management. (2-15 - 03-28-22)    PAIN:  Are you having pain? No  PRECAUTIONS: intermittent dizziness due to stenosis: Fall   WEIGHT BEARING RESTRICTIONS: No  FALLS: Has patient fallen in last 6 months? Yes. Number of falls approx. 15  LIVING ENVIRONMENT: Lives with: lives alone Lives in: House/apartment Stairs: No Has following equipment at home: None  PLOF: Independent  PATIENT GOALS: Improve walking and balance   OBJECTIVE:   GAIT: Gait pattern: step through pattern, decreased ankle dorsiflexion- Left, and Left foot flat Distance walked: 100' Assistive device utilized: None Level of assistance:  SBA Comments: improved Lt foot clearance noted during swing phase of gait  TODAY'S TREATMENT:  DATE: 04-28-22   THEREX: Sit to stand from mat 5 reps without UE support  Pt performed LLE strengthening exercises -  SLR LLE 10 reps;  hip abduction in Rt sidelying 10 reps; clamshell with green theraband 10 reps Standing hip extension LLE 10 reps; hip abduction 10 reps; (both with green theraband); LLE hip flexion with knee flexed with green theraband 10 reps; pt then performed these same exercises with RLE with green theraband  Stretches at counter - runner's stretch for hamstring/heel cord stretching bil. LE's 20 sec hold:  gastroc stretch with Lt forefoot placed on bottom shelf of cabinet 20 sec hold x 1 rep Seated LAQ LLE & RLE 10 reps 5 sec hold with green theraband; seated Lt & Rt hamstring strengthening with green theraband 10 reps each with 3 sec hold  Seated dorsiflexion LLE with green theraband 10 reps with 3 sec hold  Step ups bil. LE's 10 reps onto 6" step with RUE support on hand rail to simulate performing this ex. At home   Pt instructed in HEP for LLE strengthening; [place] Access Code: [email or web address] URL: [email or web address] Date: 04/21/2022 Prepared by: Ethelene Browns  Exercises - Sit to Stand  - 1 x daily - 7 x weekly - 1 sets - 10 reps - Clamshell with Resistance  - 1 x daily - 7 x weekly - 1 sets - 10 reps - 3 sec hold - Standing Hip Abduction with Resistance at Ankles and Counter Support  - 1 x daily - 7 x weekly - 1 sets - 10 reps - Standing Hip Extension with Resistance at Ankles and Unilateral Counter Support  - 1 x daily - 7 x weekly - 1 sets - 10 reps - Standing Hip Flexion with Resistance Loop  - 1 x daily - 7 x weekly - 1 sets - 10 reps - 3 sec hold - Standing March with Counter Support  - 1 x daily - 7 x weekly - 1 sets - 10 reps  - 3 hold - Standing Gastroc Stretch at Counter  - 1 x daily - 7 x weekly - 1 sets - 1 reps - 30 sec hold - Standing Bilateral Gastroc Stretch with Step  - 1 x daily - 7 x weekly - 1 sets - 1 reps - 20-30 hold   Access Code: [facility] URL: [email or web address] Date: 04/17/2022 Prepared by: Ethelene Browns  Exercises - Sidelying Hip Abduction  - 1 x daily - 7 x weekly - 3 sets - 10 reps - Standing Heel Raise with Support  - 1 x daily - 7 x weekly - 3 sets - 10 reps - Single Leg Heel Raise  - 1 x daily - 7 x weekly - 3 sets - 10 reps - Heel Toe Raises with Counter Support  - 1 x daily - 7 x weekly - 3 sets - 10 reps   PATIENT EDUCATION: Education details: eval results and POC:  Medbridge HEP issued Person educated: Patient and Child(ren) Education method: Explanation, Demonstration, and Handouts Education comprehension: verbalized understanding and returned demonstration  HOME EXERCISE PROGRAM: Medbridge HEP  [place]   GOALS: Goals reviewed with patient? Yes  SHORT TERM GOALS: Same as LTG's   LONG TERM GOALS: Target date: 05-15-22  Perform 5x sit to stand transfers in </= 15 secs to demo increased LE strength and improved balance upon initial standing.  Baseline: 19.5 secs (4 reps) Goal status: INITIAL  2.  Improve TUG score to </= 12.5 secs to reduce fall risk  and demo improved functional mobility. Baseline:  13.5 secs with no device Goal status: INITIAL  3.  Increase gait speed to >/= 3.2 ft/sec without device for increased gait efficiency. Baseline: 11.78 secs = 2.78 ft/sec no device Goal status: INITIAL  4.  Amb.  500' on flat, even surface without LOB and without Lt foot catching for improved safety with community ambulation/accessibility. Baseline:  Goal status: INITIAL  5.  Independent in HEP for LLE strengthening and balance. Baseline:  Goal status: INITIAL  ASSESSMENT:  CLINICAL IMPRESSION: PT session focused on review of HEP for bil. LE  strengthening with use of green theraband in today's session; pt used red therband for PRE's in previous session last week.  Pt demonstrated increased activity tolerance with pt performing standing hip exercises for bil. LE's, including hip flexion with knee flexed.  Pt fatigued prior to performing this exercise in previous session last week.  Pt's gait noted to be improved with increased Lt foot clearance in swing phase of gait noted.  Cont with POC.     OBJECTIVE IMPAIRMENTS: Abnormal gait, decreased activity tolerance, decreased balance, decreased cognition, and decreased strength.   ACTIVITY LIMITATIONS: bending, squatting, stairs, transfers, and locomotion level  PARTICIPATION LIMITATIONS: cleaning, laundry, driving, shopping, community activity, and yard work  PERSONAL FACTORS: Fitness, Past/current experiences, and 1 comorbidity: carotid stenosis (dizziness)  are also affecting patient's functional outcome.   REHAB POTENTIAL: Good  CLINICAL DECISION MAKING: Stable/uncomplicated  EVALUATION COMPLEXITY: Low  PLAN:  PT FREQUENCY: 2x/week  PT DURATION: 4 weeks  PLANNED INTERVENTIONS: Therapeutic exercises, Therapeutic activity, Neuromuscular re-education, Balance training, Gait training, Patient/Family education, Self Care, Stair training, and DME instructions  PLAN FOR NEXT SESSION: check HEP for any questions; cont. Balance, strengthening and gait; SLS on each leg; how did step up exercise go?   Alda Lea, PT 04/29/2022, 5:30 PM

## 2022-04-29 ENCOUNTER — Encounter: Payer: Self-pay | Admitting: Physical Therapy

## 2022-04-30 ENCOUNTER — Ambulatory Visit: Payer: Medicare HMO | Admitting: Physical Therapy

## 2022-05-05 ENCOUNTER — Ambulatory Visit: Payer: Medicare HMO | Admitting: Physical Therapy

## 2022-05-05 DIAGNOSIS — I69354 Hemiplegia and hemiparesis following cerebral infarction affecting left non-dominant side: Secondary | ICD-10-CM

## 2022-05-05 DIAGNOSIS — M6281 Muscle weakness (generalized): Secondary | ICD-10-CM | POA: Diagnosis not present

## 2022-05-05 DIAGNOSIS — R2689 Other abnormalities of gait and mobility: Secondary | ICD-10-CM

## 2022-05-05 NOTE — Therapy (Unsigned)
OUTPATIENT PHYSICAL THERAPY NEURO TREATMENT NOTE   Patient Name: William Matthews MRN: [id number] DOB:07/19/1950, 72 y.o., male Today's Date: 05/06/2022   PCP: Arthur Holms, NP REFERRING PROVIDER: Bonnielee Haff, MD  END OF SESSION:  PT End of Session - 05/06/22 1035     Visit Number 4    Number of Visits 9    Date for PT Re-Evaluation 05/15/22    Authorization Type A[employer]na Medicare    Authorization Time Period 04-16-22 - 06-09-22    PT Start Time 1147    PT Stop Time [id number]    PT Time Calculation (min) 48 min    Equipment Utilized During Treatment Gait belt    Activity Tolerance Patient tolerated treatment well    Behavior During Therapy WFL for tasks assessed/performed                Past Medical History:  Diagnosis Date   Anxiety    CAD (coronary artery disease)    a. s/p CABG 1994 with LIMA to D2 and LAD, SVG to D1 and ramus intermedius. b. acute inferior MI 2001 s/p rescue PTCA/stent to [facility].  b. NSTEMI 02/2014: s/p LHC with DES to oLCx c. 09/2015 PCI with DES to left main/ostial LCx   Chronic bronchitis ([place])    "they say I get it q yr" (10/03/2015)   CKD (chronic kidney disease), stage II    Depression    ED (erectile dysfunction)    GERD (gastroesophageal reflux disease)    Hyperlipidemia    Hypertension    Ischemic cardiomyopathy    a. EF 48% by nuc in 2012.   Myocardial infarction [facility]) "several"   Nephrolithiasis    Obesity    Prediabetes    Past Surgical History:  Procedure Laterality Date   CARDIAC CATHETERIZATION N/A 02/19/2015   Procedure: Left Heart Cath and Cors/Grafts Angiography;  Surgeon: Burnell Blanks, MD;  LAD & RCA 100%, LIMA-LAD 50%, SVG-OM-D1 100% stenosis, distal limb D1-OM ok, CFX 99%   CARDIAC CATHETERIZATION  02/19/2015   Procedure: Coronary Stent Intervention;  Surgeon: Burnell Blanks, MD; 3.5 x 16 mm Promus Premier DES to the ostial CFX    CARDIAC CATHETERIZATION N/A 10/03/2015   Procedure: Left Heart Cath and Cors/Grafts  Angiography;  Surgeon: Peter M Martinique, MD;  Location: [place] CV [facility];  Service: Cardiovascular;  Laterality: N/A;   CARDIAC CATHETERIZATION N/A 10/03/2015   Procedure: Coronary Stent Intervention;  Surgeon: Peter M Martinique, MD;  Location: [place] CV LAB;  Service: Cardiovascular;  Laterality: N/A;  DES- Resolute 4.0x15 to distal left main into the ostial circumflex artery   CARDIAC CATHETERIZATION N/A 10/03/2015   Procedure: Intravascular Ultrasound/IVUS;  Surgeon: Peter M Martinique, MD;  Location: [place] CV LAB;  Service: Cardiovascular;  Laterality: N/A;   CORONARY ANGIOPLASTY WITH STENT PLACEMENT  1994 - 2001 X 5   "stents each time"   CORONARY ANGIOPLASTY WITH STENT PLACEMENT  10/03/2015   CORONARY ARTERY BYPASS GRAFT  1994   LIMA-D2-LAD, SVG-D1-OM2   CYSTOSCOPY W/ STONE MANIPULATION     Patient Active Problem List   Diagnosis Date Noted   Slurred speech 03/26/2022   Chronic kidney disease, stage 3a ([place]) 03/26/2022   Hypothyroidism 03/26/2022   Paroxysmal atrial fibrillation ([place]) 03/26/2022   Heart failure with mildly reduced ejection fraction (HFmrEF) ([place]) 03/26/2022   Pain due to onychomycosis of toenails of both feet 08/23/2020   BPH with obstruction/lower urinary tract symptoms 12/18/2019   Anticoagulant long-term use 09/07/2019  Acute on chronic systolic congestive heart failure ([place]) 09/07/2019   Mitral regurgitation 09/07/2019   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   Atrial flutter ([place]) 08/15/2019   Unstable angina ([place]) 10/03/2015   NSTEMI (non-ST elevated myocardial infarction) ([place]) 02/20/2015   Hyperlipidemia    Prediabetes    Obesity    ED (erectile dysfunction)    CAD (coronary artery disease)    CKD (chronic kidney disease), stage II 02/18/2015   Nonspecific abnormal unspecified cardiovascular function study 08/11/2010   Unstable angina pectoris ([place]) 07/02/2010   Essential hypertension 07/02/2010   Erectile dysfunction 07/02/2010     ONSET DATE: 03-26-22  REFERRING DIAG:  Diagnosis  G45.9 (ICD-10-CM) - TIA (transient ischemic attack)    THERAPY DIAG:  Muscle weakness (generalized)  Hemiplegia and hemiparesis following cerebral infarction affecting left non-dominant side (HCC)  Other abnormalities of gait and mobility  Rationale for Evaluation and Treatment: Rehabilitation  SUBJECTIVE:                                                                                                                                                                                             SUBJECTIVE STATEMENT: Pt reports he hasn't done the exercises at home - says space is limited; has mostly done walking around the house  Pt accompanied by: family member son and Daughter  PERTINENT HISTORY:  72 y.o. male with medical history significant for PAF on Eliquis, CAD s/p CABG and DES, CHF, CKD stage IIIa, HTN, HLD, hypothyroidism, BPH who presented to the ED for evaluation of left facial droop and slurred speech. Patient states he initially noted left-sided facial droop beginning at the end of December associated with slurred speech.  These changes resolved the next day.  Since then he has had intermittent episodes of dizziness without syncope.  Patient was hospitalized for further management. (2-15 - 03-28-22)    PAIN:  Are you having pain? No  PRECAUTIONS: intermittent dizziness due to stenosis: Fall   WEIGHT BEARING RESTRICTIONS: No  FALLS: Has patient fallen in last 6 months? Yes. Number of falls approx. 15  LIVING ENVIRONMENT: Lives with: lives alone Lives in: House/apartment Stairs: No Has following equipment at home: None  PLOF: Independent  PATIENT GOALS: Improve walking and balance   OBJECTIVE:   GAIT: Gait pattern: step through pattern, decreased ankle dorsiflexion- Left, and Left foot flat Distance walked: 100' Assistive device utilized: None Level of assistance: SBA Comments: improved Lt foot clearance  noted during swing phase of gait  TODAY'S TREATMENT:  DATE: 05-05-22   THEREX: Sit to stand from mat 5 reps without UE support  Pt performed LLE strengthening exercises - Lt step up exercise onto 6" step 10 reps with UE support on hand rail Tapping 1st step with LLE and with RLE - 5 reps; tapping 2nd step 5 reps with each foot with UE support prn  Stretches on step - runner's stretch for hamstring/heel cord stretching bil. LE's 20 sec hold: Seated LAQ LLE & RLE with 5# weight 10 reps each with 3 sec hold Lateral step ups to 6" step inside // bars 5# weight used for standing exercises - Lt hip flexion, extension and abduction 10 reps; standing marching 5# LLE 10 reps Standing heel raises bil. LE's 10 reps with UE support  SciFit level 4.0 x 6" with bil. Ue's and LE's   Pt instructed in HEP for LLE strengthening; Medbridge Access Code: [email or web address] URL: [email or web address] Date: 04/21/2022 Prepared by: Ethelene Browns  Exercises - Sit to Stand  - 1 x daily - 7 x weekly - 1 sets - 10 reps - Clamshell with Resistance  - 1 x daily - 7 x weekly - 1 sets - 10 reps - 3 sec hold - Standing Hip Abduction with Resistance at Ankles and Counter Support  - 1 x daily - 7 x weekly - 1 sets - 10 reps - Standing Hip Extension with Resistance at Ankles and Unilateral Counter Support  - 1 x daily - 7 x weekly - 1 sets - 10 reps - Standing Hip Flexion with Resistance Loop  - 1 x daily - 7 x weekly - 1 sets - 10 reps - 3 sec hold - Standing March with Counter Support  - 1 x daily - 7 x weekly - 1 sets - 10 reps - 3 hold - Standing Gastroc Stretch at Counter  - 1 x daily - 7 x weekly - 1 sets - 1 reps - 30 sec hold - Standing Bilateral Gastroc Stretch with Step  - 1 x daily - 7 x weekly - 1 sets - 1 reps - 20-30 hold   Access Code: [facility] URL:  [email or web address] Date: 04/17/2022 Prepared by: Ethelene Browns  Exercises - Sidelying Hip Abduction  - 1 x daily - 7 x weekly - 3 sets - 10 reps - Standing Heel Raise with Support  - 1 x daily - 7 x weekly - 3 sets - 10 reps - Single Leg Heel Raise  - 1 x daily - 7 x weekly - 3 sets - 10 reps - Heel Toe Raises with Counter Support  - 1 x daily - 7 x weekly - 3 sets - 10 reps   PATIENT EDUCATION: Education details: eval results and POC:  Medbridge HEP issued Person educated: Patient and Child(ren) Education method: Explanation, Demonstration, and Handouts Education comprehension: verbalized understanding and returned demonstration  HOME EXERCISE PROGRAM: Medbridge HEP  [place]   GOALS: Goals reviewed with patient? Yes  SHORT TERM GOALS: Same as LTG's   LONG TERM GOALS: Target date: 05-15-22  Perform 5x sit to stand transfers in </= 15 secs to demo increased LE strength and improved balance upon initial standing.  Baseline: 19.5 secs (4 reps) Goal status: INITIAL  2.  Improve TUG score to </= 12.5 secs to reduce fall risk and demo improved functional mobility. Baseline:  13.5 secs with no device Goal status: INITIAL  3.  Increase gait speed to >/= 3.2 ft/sec without device for increased gait efficiency. Baseline: 11.78 secs =  2.78 ft/sec no device Goal status: INITIAL  4.  Amb.  500' on flat, even surface without LOB and without Lt foot catching for improved safety with community ambulation/accessibility. Baseline:  Goal status: INITIAL  5.  Independent in HEP for LLE strengthening and balance. Baseline:  Goal status: INITIAL  ASSESSMENT:  CLINICAL IMPRESSION: PT session focused on LLE strengthening exercises with use of 5# weight and also on standing balance exercises to improve LLE SLS.  Pt's LLE remains slightly weaker than RLE.  Pt tolerated exercises well with short seated rest breaks needed during session.  Pt continues to report occasional  catching of Lt foot but declines trial of orthosis (AFO) at this time.   Cont with POC.     OBJECTIVE IMPAIRMENTS: Abnormal gait, decreased activity tolerance, decreased balance, decreased cognition, and decreased strength.   ACTIVITY LIMITATIONS: bending, squatting, stairs, transfers, and locomotion level  PARTICIPATION LIMITATIONS: cleaning, laundry, driving, shopping, community activity, and yard work  PERSONAL FACTORS: Fitness, Past/current experiences, and 1 comorbidity: carotid stenosis (dizziness)  are also affecting patient's functional outcome.   REHAB POTENTIAL: Good  CLINICAL DECISION MAKING: Stable/uncomplicated  EVALUATION COMPLEXITY: Low  PLAN:  PT FREQUENCY: 2x/week  PT DURATION: 4 weeks  PLANNED INTERVENTIONS: Therapeutic exercises, Therapeutic activity, Neuromuscular re-education, Balance training, Gait training, Patient/Family education, Self Care, Stair training, and DME instructions  PLAN FOR NEXT SESSION:  Check LTG's - renew;  Cont. Balance, strengthening and gait; SLS on each leg   Demetrio Leighty, [place], PT 05/06/2022, 10:36 AM

## 2022-05-06 ENCOUNTER — Encounter: Payer: Self-pay | Admitting: Physical Therapy

## 2022-05-12 ENCOUNTER — Ambulatory Visit: Payer: Medicare HMO | Attending: Internal Medicine | Admitting: Physical Therapy

## 2022-05-12 DIAGNOSIS — M6281 Muscle weakness (generalized): Secondary | ICD-10-CM | POA: Diagnosis not present

## 2022-05-12 DIAGNOSIS — R2689 Other abnormalities of gait and mobility: Secondary | ICD-10-CM | POA: Diagnosis not present

## 2022-05-12 DIAGNOSIS — R2681 Unsteadiness on feet: Secondary | ICD-10-CM | POA: Diagnosis not present

## 2022-05-12 NOTE — Therapy (Signed)
OUTPATIENT PHYSICAL THERAPY NEURO TREATMENT NOTE   Patient Name: William Matthews MRN: [id number] DOB:1950/10/14, 72 y.o., male Today's Date: 05/13/2022   PCP: Arthur Holms, NP REFERRING PROVIDER: Bonnielee Haff, MD  END OF SESSION:  PT End of Session - 05/13/22 1536     Visit Number 5    Number of Visits 9    Date for PT Re-Evaluation 05/15/22    Authorization Type Aetna Medicare    Authorization Time Period 04-16-22 - 06-09-22    PT Start Time 1146    PT Stop Time 1230    PT Time Calculation (min) 44 min    Equipment Utilized During Treatment Gait belt    Activity Tolerance Patient tolerated treatment well    Behavior During Therapy WFL for tasks assessed/performed                 Past Medical History:  Diagnosis Date   Anxiety    CAD (coronary artery disease)    a. s/p CABG 1994 with LIMA to D2 and LAD, SVG to D1 and ramus intermedius. b. acute inferior MI 2001 s/p rescue PTCA/stent to [facility].  b. NSTEMI 02/2014: s/p LHC with DES to oLCx c. 09/2015 PCI with DES to left main/ostial LCx   Chronic bronchitis    "they say I get it q yr" (10/03/2015)   CKD (chronic kidney disease), stage II    Depression    ED (erectile dysfunction)    GERD (gastroesophageal reflux disease)    Hyperlipidemia    Hypertension    Ischemic cardiomyopathy    a. EF 48% by nuc in 2012.   Myocardial infarction "several"   Nephrolithiasis    Obesity    Prediabetes    Past Surgical History:  Procedure Laterality Date   CARDIAC CATHETERIZATION N/A 02/19/2015   Procedure: Left Heart Cath and Cors/Grafts Angiography;  Surgeon: Burnell Blanks, MD;  LAD & RCA 100%, LIMA-LAD 50%, SVG-OM-D1 100% stenosis, distal limb D1-OM ok, CFX 99%   CARDIAC CATHETERIZATION  02/19/2015   Procedure: Coronary Stent Intervention;  Surgeon: Burnell Blanks, MD; 3.5 x 16 mm Promus Premier DES to the ostial CFX    CARDIAC CATHETERIZATION N/A 10/03/2015   Procedure: Left Heart Cath and Cors/Grafts Angiography;   Surgeon: Peter M Martinique, MD;  Location: [place] CV LAB;  Service: Cardiovascular;  Laterality: N/A;   CARDIAC CATHETERIZATION N/A 10/03/2015   Procedure: Coronary Stent Intervention;  Surgeon: Peter M Martinique, MD;  Location: [place] CV [facility];  Service: Cardiovascular;  Laterality: N/A;  DES- Resolute 4.0x15 to distal left main into the ostial circumflex artery   CARDIAC CATHETERIZATION N/A 10/03/2015   Procedure: Intravascular Ultrasound/IVUS;  Surgeon: Peter M Martinique, MD;  Location: [place] CV [facility];  Service: Cardiovascular;  Laterality: N/A;   CORONARY ANGIOPLASTY WITH STENT PLACEMENT  1994 - 2001 X 5   "stents each time"   CORONARY ANGIOPLASTY WITH STENT PLACEMENT  10/03/2015   CORONARY ARTERY BYPASS GRAFT  1994   LIMA-D2-LAD, SVG-D1-OM2   CYSTOSCOPY W/ STONE MANIPULATION     Patient Active Problem List   Diagnosis Date Noted   Slurred speech 03/26/2022   Chronic kidney disease, stage 3a 03/26/2022   Hypothyroidism 03/26/2022   Paroxysmal atrial fibrillation 03/26/2022   Heart failure with mildly reduced ejection fraction (HFmrEF) 03/26/2022   Pain due to onychomycosis of toenails of both feet 08/23/2020   BPH with obstruction/lower urinary tract symptoms 12/18/2019   Anticoagulant long-term use 09/07/2019   Acute on chronic  systolic congestive heart failure 09/07/2019   Mitral regurgitation 09/07/2019   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   Atrial flutter 08/15/2019   Unstable angina 10/03/2015   NSTEMI (non-ST elevated myocardial infarction) 02/20/2015   Hyperlipidemia    Prediabetes    Obesity    ED (erectile dysfunction)    CAD (coronary artery disease)    CKD (chronic kidney disease), stage II 02/18/2015   Nonspecific abnormal unspecified cardiovascular function study 08/11/2010   Unstable angina pectoris ([place]) 07/02/2010   Essential hypertension 07/02/2010   Erectile dysfunction 07/02/2010    ONSET DATE: 03-26-22  REFERRING DIAG:  Diagnosis   G45.9 (ICD-10-CM) - TIA (transient ischemic attack)    THERAPY DIAG:  Muscle weakness (generalized)  Other abnormalities of gait and mobility  Rationale for Evaluation and Treatment: Rehabilitation  SUBJECTIVE:                                                                                                                                                                                             SUBJECTIVE STATEMENT: Pt reports he has done the exercises some at home - says they are going OK  Pt accompanied by: family member - wife, [name]  PERTINENT HISTORY:  72 y.o. male with medical history significant for PAF on Eliquis, CAD s/p CABG and DES, CHF, CKD stage IIIa, HTN, HLD, hypothyroidism, BPH who presented to the ED for evaluation of left facial droop and slurred speech. Patient states he initially noted left-sided facial droop beginning at the end of December associated with slurred speech.  These changes resolved the next day.  Since then he has had intermittent episodes of dizziness without syncope.  Patient was hospitalized for further management. (2-15 - 03-28-22)    PAIN:  Are you having pain? No  PRECAUTIONS: intermittent dizziness due to stenosis: Fall   WEIGHT BEARING RESTRICTIONS: No  FALLS: Has patient fallen in last 6 months? Yes. Number of falls approx. 15  LIVING ENVIRONMENT: Lives with: lives alone Lives in: House/apartment Stairs: No Has following equipment at home: None  PLOF: Independent  PATIENT GOALS: Improve walking and balance   OBJECTIVE:   GAIT: Gait pattern: step through pattern, decreased ankle dorsiflexion- Left, and Left foot flat Distance walked: 100' Assistive device utilized: None Level of assistance: SBA Comments: improved Lt foot clearance noted during swing phase of gait  TODAY'S TREATMENT:  DATE:  05-12-22  THEREX: Sit to stand from mat 5 reps without UE support; 17.75 secs 1st rep;  12.6 secs 2nd rep  TUG score 9.94 secs without device Gait velocity 10.88 secs, 10.31 secs = 3.18 ft/sec without device  Pt amb. 575' without device on flat, even surface - cues for increased initial heel contact at stance; (distance for LTG assessment/for endurance training)  Pt performed LLE strengthening exercises - Lt step up exercise onto 6" step 10 reps with UE support on hand rail Tapping 1st step with LLE and with RLE - 5 reps; tapping 2nd step 5 reps with each foot with UE support prn  Stretches on step - runner's stretch for hamstring/heel cord stretching bil. LE's 20 sec hold: Lateral step ups to 6" step inside // bars  Standing heel raises bil. LE's 10 reps with UE support;  RLE 10 reps;  LLE 10 reps with bil. UE support on counter Standing hip exercises - 3# weight used on each leg; hip flexion with knee extended and also with knee flexed 10 reps each; hip abduction and hip extension 3# weight on each leg - 10 reps each  Standing on inverted Bosu inside // bars - bil. UE support used; pt performed weight shifts anterior/posteriorly 10 reps and then laterally 10 reps each    Pt instructed in HEP for LLE strengthening; Medbridge Access Code: [email or web address] URL: [email or web address] Date: 04/21/2022 Prepared by: Ethelene Browns  Exercises - Sit to Stand  - 1 x daily - 7 x weekly - 1 sets - 10 reps - Clamshell with Resistance  - 1 x daily - 7 x weekly - 1 sets - 10 reps - 3 sec hold - Standing Hip Abduction with Resistance at Ankles and Counter Support  - 1 x daily - 7 x weekly - 1 sets - 10 reps - Standing Hip Extension with Resistance at Ankles and Unilateral Counter Support  - 1 x daily - 7 x weekly - 1 sets - 10 reps - Standing Hip Flexion with Resistance Loop  - 1 x daily - 7 x weekly - 1 sets - 10 reps - 3 sec hold - Standing March with Counter Support  - 1 x daily - 7 x weekly  - 1 sets - 10 reps - 3 hold - Standing Gastroc Stretch at Counter  - 1 x daily - 7 x weekly - 1 sets - 1 reps - 30 sec hold - Standing Bilateral Gastroc Stretch with Step  - 1 x daily - 7 x weekly - 1 sets - 1 reps - 20-30 hold   Access Code: [facility] URL: [email or web address] Date: 04/17/2022 Prepared by: Ethelene Browns  Exercises - Sidelying Hip Abduction  - 1 x daily - 7 x weekly - 3 sets - 10 reps - Standing Heel Raise with Support  - 1 x daily - 7 x weekly - 3 sets - 10 reps - Single Leg Heel Raise  - 1 x daily - 7 x weekly - 3 sets - 10 reps - Heel Toe Raises with Counter Support  - 1 x daily - 7 x weekly - 3 sets - 10 reps   PATIENT EDUCATION: Education details: eval results and POC:  Medbridge HEP issued Person educated: Patient and Child(ren) Education method: Explanation, Demonstration, and Handouts Education comprehension: verbalized understanding and returned demonstration  HOME EXERCISE PROGRAM: Medbridge HEP  [place]   GOALS: Goals reviewed with patient? Yes  SHORT TERM GOALS: Same as LTG's   LONG  TERM GOALS: Target date: 05-15-22  Perform 5x sit to stand transfers in </= 15 secs to demo increased LE strength and improved balance upon initial standing.  Baseline: 19.5 secs (4 reps); 17.75; 05-12-22  12.60 secs Goal status: Goal met 05-12-22  2.  Improve TUG score to </= 12.5 secs to reduce fall risk and demo improved functional mobility. Baseline:  13.5 secs with no device;  9.94 secs Goal status: Goal met 05-12-22  3.  Increase gait speed to >/= 3.2 ft/sec without device for increased gait efficiency. Baseline: 11.78 secs = 2.78 ft/sec no device;   05-12-22:  10.88, 10.31 = 3.18 ft/sec without device Goal status: Goal met 05-12-22  4.  Amb.  500' on flat, even surface without LOB and without Lt foot catching for improved safety with community ambulation/accessibility. Baseline:  Goal status: Goal met - 83' with no device   5.  Independent in HEP  for LLE strengthening and balance. Baseline:  Goal status: Goal met 05-12-22  ASSESSMENT:  CLINICAL IMPRESSION: PT session focused on LTG assessment for renewal and also on strengthening exercises for bil. LE's.  Pt has met 5/5 LTG's.  Pt continues to ambulate without assistive device and has increased foot flat and decreased step length bil. LE's with fatigue.  Gait pattern improved with verbal cues to increase heel contact in stance phase of gait.    Cont with POC.     OBJECTIVE IMPAIRMENTS: Abnormal gait, decreased activity tolerance, decreased balance, decreased cognition, and decreased strength.   ACTIVITY LIMITATIONS: bending, squatting, stairs, transfers, and locomotion level  PARTICIPATION LIMITATIONS: cleaning, laundry, driving, shopping, community activity, and yard work  PERSONAL FACTORS: Fitness, Past/current experiences, and 1 comorbidity: carotid stenosis (dizziness)  are also affecting patient's functional outcome.   REHAB POTENTIAL: Good  CLINICAL DECISION MAKING: Stable/uncomplicated  EVALUATION COMPLEXITY: Low  PLAN:  PT FREQUENCY: 2x/week  PT DURATION: 4 weeks  PLANNED INTERVENTIONS: Therapeutic exercises, Therapeutic activity, Neuromuscular re-education, Balance training, Gait training, Patient/Family education, Self Care, Stair training, and DME instructions  PLAN FOR NEXT SESSION:  Renew next session -  Cont. Balance, strengthening and gait; SLS on each leg   Linzey Ramser, [place], PT 05/13/2022, 5:19 PM

## 2022-05-13 ENCOUNTER — Encounter: Payer: Self-pay | Admitting: Physical Therapy

## 2022-05-14 ENCOUNTER — Ambulatory Visit: Payer: Medicare HMO | Admitting: Physical Therapy

## 2022-05-14 DIAGNOSIS — M6281 Muscle weakness (generalized): Secondary | ICD-10-CM

## 2022-05-14 DIAGNOSIS — R2689 Other abnormalities of gait and mobility: Secondary | ICD-10-CM

## 2022-05-14 DIAGNOSIS — R2681 Unsteadiness on feet: Secondary | ICD-10-CM | POA: Diagnosis not present

## 2022-05-14 NOTE — Therapy (Signed)
OUTPATIENT PHYSICAL THERAPY NEURO TREATMENT NOTE   Patient Name: William Matthews MRN: [id number] DOB:13-Dec-1950, 72 y.o., male Today's Date: 05/15/2022   PCP: Loura Back, NP REFERRING PROVIDER: Osvaldo Shipper, MD  END OF SESSION:  PT End of Session - 05/15/22 1700     Visit Number 6    Number of Visits 14   renewal completed for 8 additional visits   Date for PT Re-Evaluation 05/15/22    Authorization Type [employer]    Authorization Time Period 04-16-22 - 06-09-22;  05-14-22 - 07-10-22    PT Start Time 1147    PT Stop Time 1231    PT Time Calculation (min) 44 min    Equipment Utilized During Treatment Gait belt    Activity Tolerance Patient tolerated treatment well    Behavior During Therapy WFL for tasks assessed/performed                  Past Medical History:  Diagnosis Date   Anxiety    CAD (coronary artery disease)    a. s/p CABG 1994 with LIMA to D2 and LAD, SVG to D1 and ramus intermedius. b. acute inferior MI 2001 s/p rescue PTCA/stent to [facility].  b. NSTEMI 02/2014: s/p LHC with DES to oLCx c. 09/2015 PCI with DES to left main/ostial LCx   Chronic bronchitis    "they say I get it q yr" (10/03/2015)   CKD (chronic kidney disease), stage II    Depression    ED (erectile dysfunction)    GERD (gastroesophageal reflux disease)    Hyperlipidemia    Hypertension    Ischemic cardiomyopathy    a. EF 48% by nuc in 2012.   Myocardial infarction "several"   Nephrolithiasis    Obesity    Prediabetes    Past Surgical History:  Procedure Laterality Date   CARDIAC CATHETERIZATION N/A 02/19/2015   Procedure: Left Heart Cath and Cors/Grafts Angiography;  Surgeon: Kathleene Hazel, MD;  LAD & RCA 100%, LIMA-LAD 50%, SVG-OM-D1 100% stenosis, distal limb D1-OM ok, CFX 99%   CARDIAC CATHETERIZATION  02/19/2015   Procedure: Coronary Stent Intervention;  Surgeon: Kathleene Hazel, MD; 3.5 x 16 mm Promus Premier DES to the ostial CFX    CARDIAC CATHETERIZATION N/A  10/03/2015   Procedure: Left Heart Cath and Cors/Grafts Angiography;  Surgeon: Peter M Swaziland, MD;  Location: [facility] INVASIVE CV LAB;  Service: Cardiovascular;  Laterality: N/A;   CARDIAC CATHETERIZATION N/A 10/03/2015   Procedure: Coronary Stent Intervention;  Surgeon: Peter M Swaziland, MD;  Location: [facility] INVASIVE CV LAB;  Service: Cardiovascular;  Laterality: N/A;  DES- Resolute 4.0x15 to distal left main into the ostial circumflex artery   CARDIAC CATHETERIZATION N/A 10/03/2015   Procedure: Intravascular Ultrasound/IVUS;  Surgeon: Peter M Swaziland, MD;  Location: [facility] INVASIVE CV LAB;  Service: Cardiovascular;  Laterality: N/A;   CORONARY ANGIOPLASTY WITH STENT PLACEMENT  1994 - 2001 X 5   "stents each time"   CORONARY ANGIOPLASTY WITH STENT PLACEMENT  10/03/2015   CORONARY ARTERY BYPASS GRAFT  1994   LIMA-D2-LAD, SVG-D1-OM2   CYSTOSCOPY W/ STONE MANIPULATION     Patient Active Problem List   Diagnosis Date Noted   Slurred speech 03/26/2022   Chronic kidney disease, stage 3a 03/26/2022   Hypothyroidism 03/26/2022   Paroxysmal atrial fibrillation 03/26/2022   Heart failure with mildly reduced ejection fraction (HFmrEF) 03/26/2022   Pain due to onychomycosis of toenails of both feet 08/23/2020   BPH with obstruction/lower urinary tract symptoms  12/18/2019   Anticoagulant long-term use 09/07/2019   Acute on chronic systolic congestive heart failure 09/07/2019   Mitral regurgitation 09/07/2019   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   Atrial flutter 08/15/2019   Unstable angina 10/03/2015   NSTEMI (non-ST elevated myocardial infarction) 02/20/2015   Hyperlipidemia    Prediabetes    Obesity    ED (erectile dysfunction)    CAD (coronary artery disease)    CKD (chronic kidney disease), stage II 02/18/2015   Nonspecific abnormal unspecified cardiovascular function study 08/11/2010   Unstable angina pectoris (HCC) 07/02/2010   Essential hypertension 07/02/2010   Erectile dysfunction  07/02/2010    ONSET DATE: 03-26-22  REFERRING DIAG:  Diagnosis  G45.9 (ICD-10-CM) - TIA (transient ischemic attack)    THERAPY DIAG:  Muscle weakness (generalized)  Other abnormalities of gait and mobility  Unsteadiness on feet  Rationale for Evaluation and Treatment: Rehabilitation  SUBJECTIVE:                                                                                                                                                                                             SUBJECTIVE STATEMENT: Pt reports he was really tired after PT session on Tuesday - says he went home and took a nap  Pt accompanied by: family member - daughter, [name]  PERTINENT HISTORY:  72 y.o. male with medical history significant for PAF on Eliquis, CAD s/p CABG and DES, CHF, CKD stage IIIa, HTN, HLD, hypothyroidism, BPH who presented to the ED for evaluation of left facial droop and slurred speech. Patient states he initially noted left-sided facial droop beginning at the end of December associated with slurred speech.  These changes resolved the next day.  Since then he has had intermittent episodes of dizziness without syncope.  Patient was hospitalized for further management. (2-15 - 03-28-22)    PAIN:  Are you having pain? No  PRECAUTIONS: intermittent dizziness due to stenosis: Fall   WEIGHT BEARING RESTRICTIONS: No  FALLS: Has patient fallen in last 6 months? Yes. Number of falls approx. 15  LIVING ENVIRONMENT: Lives with: lives alone Lives in: House/apartment Stairs: No Has following equipment at home: None  PLOF: Independent  PATIENT GOALS: Improve walking and balance   OBJECTIVE:   GAIT: Gait pattern: step through pattern, decreased ankle dorsiflexion- Left, and Left foot flat Distance walked: 100' Assistive device utilized: None Level of assistance: SBA Comments: improved Lt foot clearance noted during swing phase of gait  TODAY'S TREATMENT:  DATE: 05-14-22  THEREX: Sit to stand from mat 5 reps without UE support with feet on Airex Pt performed LLE strengthening exercises - LLE & RLE step up exercise onto 6" step 10 reps with UE support on hand rail Tapping 1st step with LLE and with RLE - 5 reps; tapping 2nd step 5 reps with each foot with UE support prn; tapped 3rd step 5 reps with each foot with UE support prn with CGA  Stretches on step - runner's stretch for hamstring/heel cord stretching bil. LE's 20 sec hold:   Standing heel raises bil. LE's 10 reps with UE support;  RLE 10 reps;  LLE 10 reps with bil. UE support on // bars; amb. 10' forward on tiptoes 2 reps without UE support Standing hip exercises - 3# weight used on each leg; hip flexion with knee extended and also with knee flexed 10 reps each; hip abduction and hip extension 3# weight on each leg - 10 reps each  Seated LAQ's with 3# weight - 3-4 sec hold - RLE and LLE 10 reps each  Balance exercises- pt performed SLS on each leg - touching 3 balance bubbles 5 reps each leg  SLS - at counter - pt able to stand approx. 2 secs on RLE and approx. 1.2 secs on LLE - added this exercise to HEP   Medbridge HEP - 05-14-22:   Access Code: [email or web address] URL: [email or web address] Date: 05/15/2022 Prepared by: Maebelle Munroe  Exercises - Single Leg Stance with Support  - 1 x daily - 7 x weekly - 1 sets - 2-3 reps - 10 sec hold    Pt instructed in HEP for LLE strengthening; Medbridge Access Code: [email or web address] URL: [email or web address] Date: 04/21/2022 Prepared by: Maebelle Munroe  Exercises - Sit to Stand  - 1 x daily - 7 x weekly - 1 sets - 10 reps - Clamshell with Resistance  - 1 x daily - 7 x weekly - 1 sets - 10 reps - 3 sec hold - Standing Hip Abduction with Resistance at Ankles and Counter Support  - 1 x daily - 7 x weekly - 1 sets - 10 reps - Standing  Hip Extension with Resistance at Ankles and Unilateral Counter Support  - 1 x daily - 7 x weekly - 1 sets - 10 reps - Standing Hip Flexion with Resistance Loop  - 1 x daily - 7 x weekly - 1 sets - 10 reps - 3 sec hold - Standing March with Counter Support  - 1 x daily - 7 x weekly - 1 sets - 10 reps - 3 hold - Standing Gastroc Stretch at Counter  - 1 x daily - 7 x weekly - 1 sets - 1 reps - 30 sec hold - Standing Bilateral Gastroc Stretch with Step  - 1 x daily - 7 x weekly - 1 sets - 1 reps - 20-30 hold   Access Code: [facility] URL: [email or web address] Date: 04/17/2022 Prepared by: Maebelle Munroe  Exercises - Sidelying Hip Abduction  - 1 x daily - 7 x weekly - 3 sets - 10 reps - Standing Heel Raise with Support  - 1 x daily - 7 x weekly - 3 sets - 10 reps - Single Leg Heel Raise  - 1 x daily - 7 x weekly - 3 sets - 10 reps - Heel Toe Raises with Counter Support  - 1 x daily - 7 x weekly - 3 sets - 10 reps   PATIENT EDUCATION: 05-14-22 Education details:  added SLS to HEP  Person educated: Patient and Child(ren) Education method: Explanation, Demonstration, and Handouts Education comprehension: verbalized understanding and returned demonstration  HOME EXERCISE PROGRAM: [employer] HEP  [email or web address]   GOALS: Goals reviewed with patient? Yes  SHORT TERM GOALS: Same as LTG's   LONG TERM GOALS: Target date: 05-15-22  Perform 5x sit to stand transfers in </= 15 secs to demo increased LE strength and improved balance upon initial standing.  Baseline: 19.5 secs (4 reps); 17.75; 05-12-22  12.60 secs Goal status: Goal met 05-12-22  2.  Improve TUG score to </= 12.5 secs to reduce fall risk and demo improved functional mobility. Baseline:  13.5 secs with no device;  9.94 secs Goal status: Goal met 05-12-22  3.  Increase gait speed to >/= 3.2 ft/sec without device for increased gait efficiency. Baseline: 11.78 secs = 2.78 ft/sec no device;   05-12-22:  10.88, 10.31 = 3.18 ft/sec without  device Goal status: Goal met 05-12-22  4.  Amb.  500' on flat, even surface without LOB and without Lt foot catching for improved safety with community ambulation/accessibility. Baseline:  Goal status: Goal met - 45' with no device   5.  Independent in HEP for LLE strengthening and balance. Baseline:  Goal status: Goal met 05-12-22  UPDATED LTG's:  TARGET DATE 06-26-22 (extended 1 week due to primary PT on vacation)              1.  Perform 5x sit to stand transfers in </= 10 secs to demo increased LE strength and improved balance upon initial standing.  Baseline: 19.5 secs (4 reps); 17.75; 05-12-22  12.60 secs Goal status: UPDATED GOAL  2.  Increase gait speed to >/= 3.5 ft/sec without device for increased gait efficiency. Baseline: 11.78 secs = 2.78 ft/sec no device;   05-12-22:  10.88, 10.31 = 3.18 ft/sec without device Goal status: UPDATED GOAL  3.  Amb.  700' (6 laps)  on flat, even surface without LOB and without Lt foot catching for improved safety with community ambulation/accessibility.  Baseline: 15' with no device with cues to increase initial heel contact to decrease shuffling   Goal status:  UPDATED GOAL  4.  Transfer floor to stand with UE support on object with supervision.  Baseline:  TBA  Goal status:  NEW  5.  Independent in updated HEP for balance and strengthening exercises.              Baseline:  TBA             Goal status:  NEW  ASSESSMENT:  CLINICAL IMPRESSION: PT session focused on exercises for LE strengthening and balance training to improve SLS on each leg.  Pt noted to have increased endurance as pt required few seated rest breaks in today's session compared to performance in previous sessions.  Pt tolerated exercises well - able to perform PRE's with 3# weight in standing position.  Pt has decreased SLS on each leg - approx. 2 secs on RLE and approx. 1.2 secs on LLE.  Cont with POC.     OBJECTIVE IMPAIRMENTS: Abnormal gait, decreased activity tolerance,  decreased balance, decreased cognition, and decreased strength.   ACTIVITY LIMITATIONS: bending, squatting, stairs, transfers, and locomotion level  PARTICIPATION LIMITATIONS: cleaning, laundry, driving, shopping, community activity, and yard work  PERSONAL FACTORS: Fitness, Past/current experiences, and 1 comorbidity: carotid stenosis (dizziness)  are also affecting patient's functional outcome.   REHAB POTENTIAL: Good  CLINICAL DECISION MAKING: Stable/uncomplicated  EVALUATION  COMPLEXITY: Low  PLAN:  PT FREQUENCY: 2x/week  PT DURATION: 4 weeks  PLANNED INTERVENTIONS: Therapeutic exercises, Therapeutic activity, Neuromuscular re-education, Balance training, Gait training, Patient/Family education, Self Care, Stair training, and DME instructions  PLAN FOR NEXT SESSION: Cont. Balance, strengthening and gait; SLS on each leg   Marshia Tropea, Donavan Burnet, PT 05/15/2022, 5:03 PM

## 2022-05-15 ENCOUNTER — Encounter: Payer: Self-pay | Admitting: Physical Therapy

## 2022-05-18 ENCOUNTER — Encounter: Payer: Self-pay | Admitting: Neurology

## 2022-05-18 ENCOUNTER — Ambulatory Visit: Payer: Medicare HMO | Admitting: Neurology

## 2022-05-18 DIAGNOSIS — Z029 Encounter for administrative examinations, unspecified: Secondary | ICD-10-CM

## 2022-05-19 ENCOUNTER — Ambulatory Visit: Payer: Medicare HMO | Admitting: Physical Therapy

## 2022-05-19 DIAGNOSIS — M6281 Muscle weakness (generalized): Secondary | ICD-10-CM

## 2022-05-19 DIAGNOSIS — R2689 Other abnormalities of gait and mobility: Secondary | ICD-10-CM | POA: Diagnosis not present

## 2022-05-19 DIAGNOSIS — R2681 Unsteadiness on feet: Secondary | ICD-10-CM

## 2022-05-19 NOTE — Therapy (Signed)
OUTPATIENT PHYSICAL THERAPY NEURO TREATMENT NOTE   Patient Name: William Matthews MRN: [id number] DOB:07/19/1950, 72 y.o., male Today's Date: 05/19/2022   PCP: Loura Back, NP REFERRING PROVIDER: Osvaldo Shipper, MD  END OF SESSION:         Past Medical History:  Diagnosis Date   Anxiety    CAD (coronary artery disease)    a. s/p CABG 1994 with LIMA to D2 and LAD, SVG to D1 and ramus intermedius. b. acute inferior MI 2001 s/p rescue PTCA/stent to [facility].  b. NSTEMI 02/2014: s/p LHC with DES to oLCx c. 09/2015 PCI with DES to left main/ostial LCx   Chronic bronchitis    "they say I get it q yr" (10/03/2015)   CKD (chronic kidney disease), stage II    Depression    ED (erectile dysfunction)    GERD (gastroesophageal reflux disease)    Hyperlipidemia    Hypertension    Ischemic cardiomyopathy    a. EF 48% by nuc in 2012.   Myocardial infarction "several"   Nephrolithiasis    Obesity    Prediabetes    Past Surgical History:  Procedure Laterality Date   CARDIAC CATHETERIZATION N/A 02/19/2015   Procedure: Left Heart Cath and Cors/Grafts Angiography;  Surgeon: Kathleene Hazel, MD;  LAD & RCA 100%, LIMA-LAD 50%, SVG-OM-D1 100% stenosis, distal limb D1-OM ok, CFX 99%   CARDIAC CATHETERIZATION  02/19/2015   Procedure: Coronary Stent Intervention;  Surgeon: Kathleene Hazel, MD; 3.5 x 16 mm Promus Premier DES to the ostial CFX    CARDIAC CATHETERIZATION N/A 10/03/2015   Procedure: Left Heart Cath and Cors/Grafts Angiography;  Surgeon: Peter M Swaziland, MD;  Location: [facility] INVASIVE CV LAB;  Service: Cardiovascular;  Laterality: N/A;   CARDIAC CATHETERIZATION N/A 10/03/2015   Procedure: Coronary Stent Intervention;  Surgeon: Peter M Swaziland, MD;  Location: [facility] INVASIVE CV LAB;  Service: Cardiovascular;  Laterality: N/A;  DES- Resolute 4.0x15 to distal left main into the ostial circumflex artery   CARDIAC CATHETERIZATION N/A 10/03/2015   Procedure: Intravascular Ultrasound/IVUS;   Surgeon: Peter M Swaziland, MD;  Location: [facility] INVASIVE CV LAB;  Service: Cardiovascular;  Laterality: N/A;   CORONARY ANGIOPLASTY WITH STENT PLACEMENT  1994 - 2001 X 5   "stents each time"   CORONARY ANGIOPLASTY WITH STENT PLACEMENT  10/03/2015   CORONARY ARTERY BYPASS GRAFT  1994   LIMA-D2-LAD, SVG-D1-OM2   CYSTOSCOPY W/ STONE MANIPULATION     Patient Active Problem List   Diagnosis Date Noted   Slurred speech 03/26/2022   Chronic kidney disease, stage 3a 03/26/2022   Hypothyroidism 03/26/2022   Paroxysmal atrial fibrillation 03/26/2022   Heart failure with mildly reduced ejection fraction (HFmrEF) 03/26/2022   Pain due to onychomycosis of toenails of both feet 08/23/2020   BPH with obstruction/lower urinary tract symptoms 12/18/2019   Anticoagulant long-term use 09/07/2019   Acute on chronic systolic congestive heart failure 09/07/2019   Mitral regurgitation 09/07/2019   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   Atrial flutter 08/15/2019   Unstable angina 10/03/2015   NSTEMI (non-ST elevated myocardial infarction) 02/20/2015   Hyperlipidemia    Prediabetes    Obesity    ED (erectile dysfunction)    CAD (coronary artery disease)    CKD (chronic kidney disease), stage II 02/18/2015   Nonspecific abnormal unspecified cardiovascular function study 08/11/2010   Unstable angina pectoris (HCC) 07/02/2010   Essential hypertension 07/02/2010   Erectile dysfunction 07/02/2010    ONSET DATE: 03-26-22  REFERRING DIAG:  Diagnosis  G45.9 (ICD-10-CM) - TIA (transient ischemic attack)    THERAPY DIAG:  No diagnosis found.  Rationale for Evaluation and Treatment: Rehabilitation  SUBJECTIVE:                                                                                                                                                                                             SUBJECTIVE STATEMENT: Pt reports he was really tired after PT session on Tuesday - says he went home  and took a nap  Pt accompanied by: family member - daughter, William Matthews  PERTINENT HISTORY:  72 y.o. male with medical history significant for PAF on Eliquis, CAD s/p CABG and DES, CHF, CKD stage IIIa, HTN, HLD, hypothyroidism, BPH who presented to the ED for evaluation of left facial droop and slurred speech. Patient states he initially noted left-sided facial droop beginning at the end of December associated with slurred speech.  These changes resolved the next day.  Since then he has had intermittent episodes of dizziness without syncope.  Patient was hospitalized for further management. (2-15 - 03-28-22)    PAIN:  Are you having pain? No  PRECAUTIONS: intermittent dizziness due to stenosis: Fall   WEIGHT BEARING RESTRICTIONS: No  FALLS: Has patient fallen in last 6 months? Yes. Number of falls approx. 15  LIVING ENVIRONMENT: Lives with: lives alone Lives in: House/apartment Stairs: No Has following equipment at home: None  PLOF: Independent  PATIENT GOALS: Improve walking and balance   OBJECTIVE:   GAIT: Gait pattern: step through pattern, decreased ankle dorsiflexion- Left, and Left foot flat Distance walked: 100' Assistive device utilized: None Level of assistance: SBA Comments: improved Lt foot clearance noted during swing phase of gait  TODAY'S TREATMENT:                                                                                                                              DATE: 05-14-22  THEREX: Sit to stand from mat 5 reps without UE support with feet on Airex Pt performed LLE strengthening exercises - LLE & RLE step  up exercise onto 6" step 10 reps with UE support on hand rail Tapping 1st step with LLE and with RLE - 5 reps; tapping 2nd step 5 reps with each foot with UE support prn; tapped 3rd step 5 reps with each foot with UE support prn with CGA  Stretches on step - runner's stretch for hamstring/heel cord stretching bil. LE's 20 sec hold:   Standing heel  raises bil. LE's 10 reps with UE support;  RLE 10 reps;  LLE 10 reps with bil. UE support on // bars; amb. 10' forward on tiptoes 2 reps without UE support Standing hip exercises - 3# weight used on each leg; hip flexion with knee extended and also with knee flexed 10 reps each; hip abduction and hip extension 3# weight on each leg - 10 reps each  Seated LAQ's with 3# weight - 3-4 sec hold - RLE and LLE 10 reps each  Balance exercises- pt performed SLS on each leg - touching 3 balance bubbles 5 reps each leg  SLS - at counter - pt able to stand approx. 2 secs on RLE and approx. 1.2 secs on LLE - added this exercise to HEP   Medbridge HEP - 05-14-22:   Access Code: [email or web address] URL: [email or web address] Date: 05/15/2022 Prepared by: Maebelle Munroe  Exercises - Single Leg Stance with Support  - 1 x daily - 7 x weekly - 1 sets - 2-3 reps - 10 sec hold    Pt instructed in HEP for LLE strengthening; Medbridge Access Code: [email or web address] URL: [email or web address] Date: 04/21/2022 Prepared by: Maebelle Munroe  Exercises - Sit to Stand  - 1 x daily - 7 x weekly - 1 sets - 10 reps - Clamshell with Resistance  - 1 x daily - 7 x weekly - 1 sets - 10 reps - 3 sec hold - Standing Hip Abduction with Resistance at Ankles and Counter Support  - 1 x daily - 7 x weekly - 1 sets - 10 reps - Standing Hip Extension with Resistance at Ankles and Unilateral Counter Support  - 1 x daily - 7 x weekly - 1 sets - 10 reps - Standing Hip Flexion with Resistance Loop  - 1 x daily - 7 x weekly - 1 sets - 10 reps - 3 sec hold - Standing March with Counter Support  - 1 x daily - 7 x weekly - 1 sets - 10 reps - 3 hold - Standing Gastroc Stretch at Counter  - 1 x daily - 7 x weekly - 1 sets - 1 reps - 30 sec hold - Standing Bilateral Gastroc Stretch with Step  - 1 x daily - 7 x weekly - 1 sets - 1 reps - 20-30 hold   Access Code: [facility] URL: [email or web address] Date:  04/17/2022 Prepared by: Maebelle Munroe  Exercises - Sidelying Hip Abduction  - 1 x daily - 7 x weekly - 3 sets - 10 reps - Standing Heel Raise with Support  - 1 x daily - 7 x weekly - 3 sets - 10 reps - Single Leg Heel Raise  - 1 x daily - 7 x weekly - 3 sets - 10 reps - Heel Toe Raises with Counter Support  - 1 x daily - 7 x weekly - 3 sets - 10 reps   PATIENT EDUCATION: 05-14-22 Education details: added SLS to HEP  Person educated: Patient and Child(ren) Education method: Explanation, Demonstration, and Handouts Education comprehension: verbalized understanding and returned demonstration  HOME EXERCISE PROGRAM:  [employer] HEP  [email or web address]   GOALS: Goals reviewed with patient? Yes  SHORT TERM GOALS: Same as LTG's   LONG TERM GOALS: Target date: 05-15-22  Perform 5x sit to stand transfers in </= 15 secs to demo increased LE strength and improved balance upon initial standing.  Baseline: 19.5 secs (4 reps); 17.75; 05-12-22  12.60 secs Goal status: Goal met 05-12-22  2.  Improve TUG score to </= 12.5 secs to reduce fall risk and demo improved functional mobility. Baseline:  13.5 secs with no device;  9.94 secs Goal status: Goal met 05-12-22  3.  Increase gait speed to >/= 3.2 ft/sec without device for increased gait efficiency. Baseline: 11.78 secs = 2.78 ft/sec no device;   05-12-22:  10.88, 10.31 = 3.18 ft/sec without device Goal status: Goal met 05-12-22  4.  Amb.  500' on flat, even surface without LOB and without Lt foot catching for improved safety with community ambulation/accessibility. Baseline:  Goal status: Goal met - 93' with no device   5.  Independent in HEP for LLE strengthening and balance. Baseline:  Goal status: Goal met 05-12-22  UPDATED LTG's:  TARGET DATE 06-26-22 (extended 1 week due to primary PT on vacation)              1.  Perform 5x sit to stand transfers in </= 10 secs to demo increased LE strength and improved balance upon initial standing.  Baseline: 19.5 secs  (4 reps); 17.75; 05-12-22  12.60 secs Goal status: UPDATED GOAL  2.  Increase gait speed to >/= 3.5 ft/sec without device for increased gait efficiency. Baseline: 11.78 secs = 2.78 ft/sec no device;   05-12-22:  10.88, 10.31 = 3.18 ft/sec without device Goal status: UPDATED GOAL  3.  Amb.  700' (6 laps)  on flat, even surface without LOB and without Lt foot catching for improved safety with community ambulation/accessibility.  Baseline: 80' with no device with cues to increase initial heel contact to decrease shuffling   Goal status:  UPDATED GOAL  4.  Transfer floor to stand with UE support on object with supervision.  Baseline:  TBA  Goal status:  NEW  5.  Independent in updated HEP for balance and strengthening exercises.              Baseline:  TBA             Goal status:  NEW  ASSESSMENT:  CLINICAL IMPRESSION: PT session focused on exercises for LE strengthening and balance training to improve SLS on each leg.  Pt noted to have increased endurance as pt required few seated rest breaks in today's session compared to performance in previous sessions.  Pt tolerated exercises well - able to perform PRE's with 3# weight in standing position.  Pt has decreased SLS on each leg - approx. 2 secs on RLE and approx. 1.2 secs on LLE.  Cont with POC.     OBJECTIVE IMPAIRMENTS: Abnormal gait, decreased activity tolerance, decreased balance, decreased cognition, and decreased strength.   ACTIVITY LIMITATIONS: bending, squatting, stairs, transfers, and locomotion level  PARTICIPATION LIMITATIONS: cleaning, laundry, driving, shopping, community activity, and yard work  PERSONAL FACTORS: Fitness, Past/current experiences, and 1 comorbidity: carotid stenosis (dizziness)  are also affecting patient's functional outcome.   REHAB POTENTIAL: Good  CLINICAL DECISION MAKING: Stable/uncomplicated  EVALUATION COMPLEXITY: Low  PLAN:  PT FREQUENCY: 2x/week  PT DURATION: 4 weeks  PLANNED  INTERVENTIONS: Therapeutic exercises, Therapeutic activity, Neuromuscular re-education, Balance training, Gait training, Patient/Family  education, Self Care, Stair training, and DME instructions  PLAN FOR NEXT SESSION: Cont. Balance, strengthening and gait; SLS on each leg   Vestal Crandall, Donavan BurnetLinda Suzanne, PT 05/19/2022, 2:57 PM

## 2022-05-20 ENCOUNTER — Encounter: Payer: Self-pay | Admitting: Physical Therapy

## 2022-05-20 DIAGNOSIS — H35373 Puckering of macula, bilateral: Secondary | ICD-10-CM | POA: Diagnosis not present

## 2022-05-20 DIAGNOSIS — H2513 Age-related nuclear cataract, bilateral: Secondary | ICD-10-CM | POA: Diagnosis not present

## 2022-05-20 DIAGNOSIS — I679 Cerebrovascular disease, unspecified: Secondary | ICD-10-CM | POA: Diagnosis not present

## 2022-05-21 ENCOUNTER — Ambulatory Visit: Payer: Medicare HMO | Admitting: Physical Therapy

## 2022-05-21 DIAGNOSIS — M6281 Muscle weakness (generalized): Secondary | ICD-10-CM | POA: Diagnosis not present

## 2022-05-21 DIAGNOSIS — R2689 Other abnormalities of gait and mobility: Secondary | ICD-10-CM | POA: Diagnosis not present

## 2022-05-21 DIAGNOSIS — R2681 Unsteadiness on feet: Secondary | ICD-10-CM | POA: Diagnosis not present

## 2022-05-21 NOTE — Therapy (Signed)
OUTPATIENT PHYSICAL THERAPY NEURO TREATMENT NOTE   Patient Name: William Matthews MRN: [id number] DOB:November 19, 1950, 72 y.o., male Today's Date: 05/22/2022   PCP: Loura Back, NP REFERRING PROVIDER: Osvaldo Shipper, MD  END OF SESSION:  PT End of Session - 05/22/22 1428     Visit Number 8    Number of Visits 14   renewal completed for 8 additional visits   Date for PT Re-Evaluation 06/19/22    Authorization Type [employer]    Authorization Time Period 04-16-22 - 06-09-22;  05-14-22 - 07-10-22    PT Start Time 1450    PT Stop Time 1533    PT Time Calculation (min) 43 min    Equipment Utilized During Treatment Gait belt    Activity Tolerance Patient tolerated treatment well    Behavior During Therapy WFL for tasks assessed/performed                    Past Medical History:  Diagnosis Date   Anxiety    CAD (coronary artery disease)    a. s/p CABG 1994 with LIMA to D2 and LAD, SVG to D1 and ramus intermedius. b. acute inferior MI 2001 s/p rescue PTCA/stent to [facility].  b. NSTEMI 02/2014: s/p LHC with DES to oLCx c. 09/2015 PCI with DES to left main/ostial LCx   Chronic bronchitis    "they say I get it q yr" (10/03/2015)   CKD (chronic kidney disease), stage II    Depression    ED (erectile dysfunction)    GERD (gastroesophageal reflux disease)    Hyperlipidemia    Hypertension    Ischemic cardiomyopathy    a. EF 48% by nuc in 2012.   Myocardial infarction "several"   Nephrolithiasis    Obesity    Prediabetes    Past Surgical History:  Procedure Laterality Date   CARDIAC CATHETERIZATION N/A 02/19/2015   Procedure: Left Heart Cath and Cors/Grafts Angiography;  Surgeon: Kathleene Hazel, MD;  LAD & RCA 100%, LIMA-LAD 50%, SVG-OM-D1 100% stenosis, distal limb D1-OM ok, CFX 99%   CARDIAC CATHETERIZATION  02/19/2015   Procedure: Coronary Stent Intervention;  Surgeon: Kathleene Hazel, MD; 3.5 x 16 mm Promus Premier DES to the ostial CFX    CARDIAC CATHETERIZATION N/A  10/03/2015   Procedure: Left Heart Cath and Cors/Grafts Angiography;  Surgeon: Peter M Swaziland, MD;  Location: [facility] INVASIVE CV LAB;  Service: Cardiovascular;  Laterality: N/A;   CARDIAC CATHETERIZATION N/A 10/03/2015   Procedure: Coronary Stent Intervention;  Surgeon: Peter M Swaziland, MD;  Location: [facility] INVASIVE CV LAB;  Service: Cardiovascular;  Laterality: N/A;  DES- Resolute 4.0x15 to distal left main into the ostial circumflex artery   CARDIAC CATHETERIZATION N/A 10/03/2015   Procedure: Intravascular Ultrasound/IVUS;  Surgeon: Peter M Swaziland, MD;  Location: [facility] INVASIVE CV LAB;  Service: Cardiovascular;  Laterality: N/A;   CORONARY ANGIOPLASTY WITH STENT PLACEMENT  1994 - 2001 X 5   "stents each time"   CORONARY ANGIOPLASTY WITH STENT PLACEMENT  10/03/2015   CORONARY ARTERY BYPASS GRAFT  1994   LIMA-D2-LAD, SVG-D1-OM2   CYSTOSCOPY W/ STONE MANIPULATION     Patient Active Problem List   Diagnosis Date Noted   Slurred speech 03/26/2022   Chronic kidney disease, stage 3a 03/26/2022   Hypothyroidism 03/26/2022   Paroxysmal atrial fibrillation 03/26/2022   Heart failure with mildly reduced ejection fraction (HFmrEF) 03/26/2022   Pain due to onychomycosis of toenails of both feet 08/23/2020   BPH with obstruction/lower urinary  tract symptoms 12/18/2019   Anticoagulant long-term use 09/07/2019   Acute on chronic systolic congestive heart failure 09/07/2019   Mitral regurgitation 09/07/2019   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   Atrial flutter 08/15/2019   Unstable angina 10/03/2015   NSTEMI (non-ST elevated myocardial infarction) 02/20/2015   Hyperlipidemia    Prediabetes    Obesity    ED (erectile dysfunction)    CAD (coronary artery disease)    CKD (chronic kidney disease), stage II 02/18/2015   Nonspecific abnormal unspecified cardiovascular function study 08/11/2010   Unstable angina pectoris (HCC) 07/02/2010   Essential hypertension 07/02/2010   Erectile dysfunction  07/02/2010    ONSET DATE: 03-26-22  REFERRING DIAG:  Diagnosis  G45.9 (ICD-10-CM) - TIA (transient ischemic attack)    THERAPY DIAG:  Muscle weakness (generalized)  Unsteadiness on feet  Rationale for Evaluation and Treatment: Rehabilitation  SUBJECTIVE:                                                                                                                                                                                             SUBJECTIVE STATEMENT: Pt reports no changes since previous visit - has not done the balance exercise on pillow at home yet but plans to  Pt accompanied by: family member - daughter, [name]  PERTINENT HISTORY:  72 y.o. male with medical history significant for PAF on Eliquis, CAD s/p CABG and DES, CHF, CKD stage IIIa, HTN, HLD, hypothyroidism, BPH who presented to the ED for evaluation of left facial droop and slurred speech. Patient states he initially noted left-sided facial droop beginning at the end of December associated with slurred speech.  These changes resolved the next day.  Since then he has had intermittent episodes of dizziness without syncope.  Patient was hospitalized for further management. (2-15 - 03-28-22)    PAIN:  Are you having pain? No  PRECAUTIONS: intermittent dizziness due to stenosis: Fall   WEIGHT BEARING RESTRICTIONS: No  FALLS: Has patient fallen in last 6 months? Yes. Number of falls approx. 15  LIVING ENVIRONMENT: Lives with: lives alone Lives in: House/apartment Stairs: No Has following equipment at home: None  PLOF: Independent  PATIENT GOALS: Improve walking and balance   OBJECTIVE:   GAIT: Gait pattern: step through pattern, decreased ankle dorsiflexion- Left, and Left foot flat Distance walked: 115' Assistive device utilized: None Level of assistance: SBA Comments: improved Lt foot clearance noted during swing phase of gait  TODAY'S TREATMENT:  DATE: 05-21-22  THEREX:  Pt performed LLE strengthening exercises - LLE & RLE step up exercise onto 6" step 10 reps with UE support on hand rail Tapping 1st step with LLE and with RLE - 5 reps; tapping 2nd step 5 reps with each foot with UE support prn  Stretches on step - runner's stretch for hamstring/heel cord stretching bil. LE's 20 sec hold:  Standing heel raises bil. LE's 10 reps with UE support;  RLE 10 reps;  LLE 10 reps with bil. UE support on // bars;  Standing hip exercises - 4# weight used on each leg; hip flexion with knee flexed 10 reps each; hip abduction and hip extension 4# weight on each leg - 10 reps each  Standing Balance: Surface: Airex Position: Feet Hip Width Apart Completed with: Eyes Open and Eyes Closed; Head Turns x 5 Reps and Head Nods x 5 Reps  Marching on Airex in corner - 10 reps with EO; 10 reps with EC with CGA to min assist for balance recovery; standing on Airex - EO - performed head turns horizontally 5 reps and then vertically 5 reps with CGA for balance  Alternating stepping down to floor 5 reps each LE from Airex    Rockerboard - placed inside // bars - 10 reps EO without UE support; 1 finger support on each hand - 10 reps with EC x 2 reps Pt held board steady - performed horizontal head turns 5 reps each with UE support prn and then vertical head turns 5 reps with UE support with EO with targets for improved gaze stabilization  Stepping over and back of black balance beam 5 reps each LE without UE support on // bars - standing on floor  05-19-22 Added standing on pillow or blankets at home with EO and EC to HEP MedbridgeAccess Code: [email or web address] URL: [email or web address] Date: 05/20/2022 Prepared by: Maebelle MunroeLinda Khylee Algeo  Exercises - Standing Balance with Eyes Closed on Foam  - 1 x daily - 7 x weekly - 3 sets - 10 reps   Medbridge HEP - 05-14-22:   Access Code:  [email or web address] URL: [email or web address] Date: 05/15/2022 Prepared by: Maebelle MunroeLinda Hyrum Shaneyfelt  Exercises - Single Leg Stance with Support  - 1 x daily - 7 x weekly - 1 sets - 2-3 reps - 10 sec hold    Pt instructed in HEP for LLE strengthening; Medbridge Access Code: [email or web address] URL: [email or web address] Date: 04/21/2022 Prepared by: Maebelle MunroeLinda Agam Davenport  Exercises - Sit to Stand  - 1 x daily - 7 x weekly - 1 sets - 10 reps - Clamshell with Resistance  - 1 x daily - 7 x weekly - 1 sets - 10 reps - 3 sec hold - Standing Hip Abduction with Resistance at Ankles and Counter Support  - 1 x daily - 7 x weekly - 1 sets - 10 reps - Standing Hip Extension with Resistance at Ankles and Unilateral Counter Support  - 1 x daily - 7 x weekly - 1 sets - 10 reps - Standing Hip Flexion with Resistance Loop  - 1 x daily - 7 x weekly - 1 sets - 10 reps - 3 sec hold - Standing March with Counter Support  - 1 x daily - 7 x weekly - 1 sets - 10 reps - 3 hold - Standing Gastroc Stretch at Counter  - 1 x daily - 7 x weekly - 1 sets - 1 reps - 30 sec hold - Standing Bilateral Gastroc Stretch with Step  - 1  x daily - 7 x weekly - 1 sets - 1 reps - 20-30 hold   Access Code: [facility] URL: [email or web address] Date: 04/17/2022 Prepared by: Maebelle Munroe  Exercises - Sidelying Hip Abduction  - 1 x daily - 7 x weekly - 3 sets - 10 reps - Standing Heel Raise with Support  - 1 x daily - 7 x weekly - 3 sets - 10 reps - Single Leg Heel Raise  - 1 x daily - 7 x weekly - 3 sets - 10 reps - Heel Toe Raises with Counter Support  - 1 x daily - 7 x weekly - 3 sets - 10 reps   PATIENT EDUCATION: 05-14-22 Education details: added SLS to HEP  Person educated: Patient and Child(ren) Education method: Explanation, Demonstration, and Handouts Education comprehension: verbalized understanding and returned demonstration  HOME EXERCISE PROGRAM: Medbridge HEP  [email or web address]   GOALS: Goals reviewed with  patient? Yes  SHORT TERM GOALS: Same as LTG's   LONG TERM GOALS: Target date: 05-15-22  Perform 5x sit to stand transfers in </= 15 secs to demo increased LE strength and improved balance upon initial standing.  Baseline: 19.5 secs (4 reps); 17.75; 05-12-22  12.60 secs Goal status: Goal met 05-12-22  2.  Improve TUG score to </= 12.5 secs to reduce fall risk and demo improved functional mobility. Baseline:  13.5 secs with no device;  9.94 secs Goal status: Goal met 05-12-22  3.  Increase gait speed to >/= 3.2 ft/sec without device for increased gait efficiency. Baseline: 11.78 secs = 2.78 ft/sec no device;   05-12-22:  10.88, 10.31 = 3.18 ft/sec without device Goal status: Goal met 05-12-22  4.  Amb.  500' on flat, even surface without LOB and without Lt foot catching for improved safety with community ambulation/accessibility. Baseline:  Goal status: Goal met - 60' with no device   5.  Independent in HEP for LLE strengthening and balance. Baseline:  Goal status: Goal met 05-12-22  UPDATED LTG's:  TARGET DATE 06-26-22 (extended 1 week due to primary PT on vacation)              1.  Perform 5x sit to stand transfers in </= 10 secs to demo increased LE strength and improved balance upon initial standing.  Baseline: 19.5 secs (4 reps); 17.75; 05-12-22  12.60 secs Goal status: UPDATED GOAL  2.  Increase gait speed to >/= 3.5 ft/sec without device for increased gait efficiency. Baseline: 11.78 secs = 2.78 ft/sec no device;   05-12-22:  10.88, 10.31 = 3.18 ft/sec without device Goal status: UPDATED GOAL  3.  Amb.  700' (6 laps)  on flat, even surface without LOB and without Lt foot catching for improved safety with community ambulation/accessibility.  Baseline: 25' with no device with cues to increase initial heel contact to decrease shuffling   Goal status:  UPDATED GOAL  4.  Transfer floor to stand with UE support on object with supervision.  Baseline:  TBA  Goal status:  NEW  5.  Independent  in updated HEP for balance and strengthening exercises.              Baseline:  TBA             Goal status:  NEW  ASSESSMENT:  CLINICAL IMPRESSION: PT session focused on exercises for LE strengthening and balance exercises on compliant surface to increase vestibular input in maintaining balance.  Weight for PRE's increased from 3# to 4# for standing hip  exercises bil. LE's.  Pt is progressing well towards LTG's.   Cont with POC.     OBJECTIVE IMPAIRMENTS: Abnormal gait, decreased activity tolerance, decreased balance, decreased cognition, and decreased strength.   ACTIVITY LIMITATIONS: bending, squatting, stairs, transfers, and locomotion level  PARTICIPATION LIMITATIONS: cleaning, laundry, driving, shopping, community activity, and yard work  PERSONAL FACTORS: Fitness, Past/current experiences, and 1 comorbidity: carotid stenosis (dizziness)  are also affecting patient's functional outcome.   REHAB POTENTIAL: Good  CLINICAL DECISION MAKING: Stable/uncomplicated  EVALUATION COMPLEXITY: Low  PLAN:  PT FREQUENCY: 2x/week  PT DURATION: 4 weeks  PLANNED INTERVENTIONS: Therapeutic exercises, Therapeutic activity, Neuromuscular re-education, Balance training, Gait training, Patient/Family education, Self Care, Stair training, and DME instructions  PLAN FOR NEXT SESSION: Cont. Balance, strengthening and gait; SLS on each leg   Hensley Treat, Donavan Burnet, PT 05/22/2022, 2:30 PM

## 2022-05-22 ENCOUNTER — Encounter: Payer: Self-pay | Admitting: Physical Therapy

## 2022-05-25 ENCOUNTER — Ambulatory Visit (INDEPENDENT_AMBULATORY_CARE_PROVIDER_SITE_OTHER): Payer: Medicare HMO

## 2022-05-25 ENCOUNTER — Encounter (HOSPITAL_COMMUNITY): Payer: Self-pay

## 2022-05-25 ENCOUNTER — Ambulatory Visit (HOSPITAL_COMMUNITY)
Admission: EM | Admit: 2022-05-25 | Discharge: 2022-05-25 | Disposition: A | Discharge status: Home / Self Care | Payer: Medicare HMO | Attending: Physician Assistant | Admitting: Physician Assistant

## 2022-05-25 DIAGNOSIS — S20212A Contusion of left front wall of thorax, initial encounter: Secondary | ICD-10-CM | POA: Diagnosis not present

## 2022-05-25 DIAGNOSIS — W19XXXA Unspecified fall, initial encounter: Secondary | ICD-10-CM

## 2022-05-25 DIAGNOSIS — S61011A Laceration without foreign body of right thumb without damage to nail, initial encounter: Secondary | ICD-10-CM | POA: Diagnosis not present

## 2022-05-25 DIAGNOSIS — I10 Essential (primary) hypertension: Secondary | ICD-10-CM

## 2022-05-25 DIAGNOSIS — R0782 Intercostal pain: Secondary | ICD-10-CM

## 2022-05-25 DIAGNOSIS — S20219A Contusion of unspecified front wall of thorax, initial encounter: Secondary | ICD-10-CM | POA: Diagnosis not present

## 2022-05-25 MED ORDER — MUPIROCIN 2 % EX OINT: 1.0000 | TOPICAL_OINTMENT | Freq: Two times a day (BID) | CUTANEOUS | 0 refills | Status: DC

## 2022-05-25 MED ORDER — HYDROCODONE-ACETAMINOPHEN 5-325 MG PO TABS: 0.5000 | ORAL_TABLET | Freq: Two times a day (BID) | ORAL | 0 refills | Status: AC | PRN

## 2022-05-25 MED ORDER — LIDOCAINE 5 % EX PTCH: 1.0000 | MEDICATED_PATCH | CUTANEOUS | 0 refills | Status: AC

## 2022-05-25 NOTE — Discharge Instructions (Signed)
They did not see any fractures on your ribs.  If you are still having pain in a few weeks I recommend that you have repeat x-ray.  Use lidocaine patches over this area.  Leave these on during the day and then remove them at night.  Take hydrocodone 0.5 to 1 tablet up to twice a day.  This make you sleepy to make sure you are not driving or drinking alcohol with it.  You can use Tylenol for breakthrough pain.  Use the incentive spirometer as we discussed.  If you have any worsening symptoms you need to be seen immediately including increasing chest pain, shortness of breath, heart racing.  Keep your wound clean.  Apply Bactroban ointment twice daily.  We did not give you a tetanus shot so if you change your mind please return and we will provide this.  Your blood pressure is elevated.  Please monitor this at home.  Follow-up with your primary care to ensure that this normalizes.  If you develop any chest pain, shortness of breath, headache, vision change, dizziness in setting of high blood pressure you need to go to the emergency room.

## 2022-05-25 NOTE — ED Triage Notes (Signed)
Pt states that he fells going down steps. Hurt left side of ribs. Happened at 9 am. Hasn't taken anything to help with pain.
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
## 2022-05-26 ENCOUNTER — Ambulatory Visit: Payer: Medicare HMO | Admitting: Physical Therapy

## 2022-05-26 DIAGNOSIS — M6281 Muscle weakness (generalized): Secondary | ICD-10-CM | POA: Diagnosis not present

## 2022-05-26 DIAGNOSIS — R2689 Other abnormalities of gait and mobility: Secondary | ICD-10-CM

## 2022-05-26 DIAGNOSIS — R2681 Unsteadiness on feet: Secondary | ICD-10-CM | POA: Diagnosis not present

## 2022-05-26 NOTE — Therapy (Signed)
OUTPATIENT PHYSICAL THERAPY NEURO TREATMENT NOTE   Patient Name: William Matthews MRN: [id number] DOB:Jun 10, 1950, 72 y.o., male Today's Date: 05/27/2022   PCP: Loura Back, NP REFERRING PROVIDER: Osvaldo Shipper, MD  END OF SESSION:  PT End of Session - 05/27/22 1858     Visit Number 9    Number of Visits 14   renewal completed for 8 additional visits   Date for PT Re-Evaluation 06/19/22    Authorization Type [employer]    Authorization Time Period 04-16-22 - 06-09-22;  05-14-22 - 07-10-22    PT Start Time 0847    PT Stop Time 0931    PT Time Calculation (min) 44 min    Equipment Utilized During Treatment Gait belt    Activity Tolerance Patient tolerated treatment well    Behavior During Therapy WFL for tasks assessed/performed                     Past Medical History:  Diagnosis Date   Anxiety    CAD (coronary artery disease)    a. s/p CABG 1994 with LIMA to D2 and LAD, SVG to D1 and ramus intermedius. b. acute inferior MI 2001 s/p rescue PTCA/stent to [facility].  b. NSTEMI 02/2014: s/p LHC with DES to oLCx c. 09/2015 PCI with DES to left main/ostial LCx   Chronic bronchitis    "they say I get it q yr" (10/03/2015)   CKD (chronic kidney disease), stage II    Depression    ED (erectile dysfunction)    GERD (gastroesophageal reflux disease)    Hyperlipidemia    Hypertension    Ischemic cardiomyopathy    a. EF 48% by nuc in 2012.   Myocardial infarction "several"   Nephrolithiasis    Obesity    Prediabetes    Past Surgical History:  Procedure Laterality Date   CARDIAC CATHETERIZATION N/A 02/19/2015   Procedure: Left Heart Cath and Cors/Grafts Angiography;  Surgeon: Kathleene Hazel, MD;  LAD & RCA 100%, LIMA-LAD 50%, SVG-OM-D1 100% stenosis, distal limb D1-OM ok, CFX 99%   CARDIAC CATHETERIZATION  02/19/2015   Procedure: Coronary Stent Intervention;  Surgeon: Kathleene Hazel, MD; 3.5 x 16 mm Promus Premier DES to the ostial CFX    CARDIAC CATHETERIZATION  N/A 10/03/2015   Procedure: Left Heart Cath and Cors/Grafts Angiography;  Surgeon: Peter M Swaziland, MD;  Location[facility] INVASIVE CV LAB;  Service: Cardiovascular;  Laterality: N/A;   CARDIAC CATHETERIZATION N/A 10/03/2015   Procedure: Coronary Stent Intervention;  Surgeon: Peter M Swaziland, MD;  Location: [facility] INVASIVE CV LAB;  Service: Cardiovascular;  Laterality: N/A;  DES- Resolute 4.0x15 to distal left main into the ostial circumflex artery   CARDIAC CATHETERIZATION N/A 10/03/2015   Procedure: Intravascular Ultrasound/IVUS;  Surgeon: Peter M Swaziland, MD;  Location: [facility] INVASIVE CV LAB;  Service: Cardiovascular;  Laterality: N/A;   CORONARY ANGIOPLASTY WITH STENT PLACEMENT  1994 - 2001 X 5   "stents each time"   CORONARY ANGIOPLASTY WITH STENT PLACEMENT  10/03/2015   CORONARY ARTERY BYPASS GRAFT  1994   LIMA-D2-LAD, SVG-D1-OM2   CYSTOSCOPY W/ STONE MANIPULATION     Patient Active Problem List   Diagnosis Date Noted   Slurred speech 03/26/2022   Chronic kidney disease, stage 3a 03/26/2022   Hypothyroidism 03/26/2022   Paroxysmal atrial fibrillation 03/26/2022   Heart failure with mildly reduced ejection fraction (HFmrEF) 03/26/2022   Pain due to onychomycosis of toenails of both feet 08/23/2020   BPH with obstruction/lower  urinary tract symptoms 12/18/2019   Anticoagulant long-term use 09/07/2019   Acute on chronic systolic congestive heart failure 09/07/2019   Mitral regurgitation 09/07/2019   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   Atrial flutter 08/15/2019   Unstable angina 10/03/2015   NSTEMI (non-ST elevated myocardial infarction) 02/20/2015   Hyperlipidemia    Prediabetes    Obesity    ED (erectile dysfunction)    CAD (coronary artery disease)    CKD (chronic kidney disease), stage II 02/18/2015   Nonspecific abnormal unspecified cardiovascular function study 08/11/2010   Unstable angina pectoris (HCC) 07/02/2010   Essential hypertension 07/02/2010   Erectile  dysfunction 07/02/2010    ONSET DATE: 03-26-22  REFERRING DIAG:  Diagnosis  G45.9 (ICD-10-CM) - TIA (transient ischemic attack)    THERAPY DIAG:  Muscle weakness (generalized)  Unsteadiness on feet  Other abnormalities of gait and mobility  Rationale for Evaluation and Treatment: Rehabilitation  SUBJECTIVE:                                                                                                                                                                                             SUBJECTIVE STATEMENT: [place] - says he was trying to use his cane in one hand and carry paperwork in his Lt hand and lost his balance and fell; hit the railing on his Lt side - bruised some ribs - went to Urgent Care and had x-rays done - did not show any fractures  Pt accompanied by: family member - daughter, [name]  PERTINENT HISTORY:  72 y.o. male with medical history significant for PAF on Eliquis, CAD s/p CABG and DES, CHF, CKD stage IIIa, HTN, HLD, hypothyroidism, BPH who presented to the ED for evaluation of left facial droop and slurred speech. Patient states he initially noted left-sided facial droop beginning at the end of December associated with slurred speech.  These changes resolved the next day.  Since then he has had intermittent episodes of dizziness without syncope.  Patient was hospitalized for further management. (2-15 - 03-28-22)    PAIN:  Are you having pain? No  PRECAUTIONS: intermittent dizziness due to stenosis: Fall   WEIGHT BEARING RESTRICTIONS: No  FALLS: Has patient fallen in last 6 months? Yes. Number of falls approx. 15  LIVING ENVIRONMENT: Lives with: lives alone Lives in: House/apartment Stairs: No Has following equipment at home: None  PLOF: Independent  PATIENT GOALS: Improve walking and balance   OBJECTIVE:   GAIT: Gait pattern: step through pattern, decreased ankle dorsiflexion- Left, and Left foot  flat Distance walked: 115' Assistive device utilized:  None Level of assistance: SBA Comments: improved Lt foot clearance noted during swing phase of gait  TODAY'S TREATMENT:                                                                                                                              DATE: 05-26-22  THEREX:  Pt performed LLE strengthening exercises - LLE & RLE step up exercise onto 6" step 10 reps with UE support on hand rail Tapping 1st step with LLE and with RLE - 5 reps; tapping 2nd step 5 reps with each foot with UE support prn  Stretches on step - runner's stretch for hamstring/heel cord stretching bil. LE's 20 sec hold:  Standing heel raises bil. LE's 10 reps with UE support;  RLE 10 reps;  LLE 10 reps with bil. UE support on // bars;  SciFit level 3.0 x 8" with bil. Ue's and LE's  Standing Balance: Surface: Airex Position: Feet Hip Width Apart Completed with: Eyes Open and Eyes Closed; Head Turns x 5 Reps and Head Nods x 5 Reps  Standing on floor - 3 balance bubbles used for targets - pt touched each one 5 times with each leg to improve SLS on each leg  Alternating stepping down to floor 5 reps each LE from Airex    Rockerboard - placed inside // bars - 10 reps EO without UE support; 1 finger support on each hand - 10 reps with EC x 2 reps Pt held board steady - performed horizontal head turns 5 reps each with UE support prn and then vertical head turns 5 reps with UE support with EO with targets for improved gaze stabilization  Stepping over and back of black balance beam 5 reps each LE without UE support on // bars - standing on floor  05-19-22 Added standing on pillow or blankets at home with EO and EC to HEP MedbridgeAccess Code: [email or web address] URL: [email or web address] Date: 05/20/2022 Prepared by: Maebelle Munroe  Exercises - Standing Balance with Eyes Closed on Foam  - 1 x daily - 7 x weekly - 3 sets - 10 reps   Medbridge HEP - 05-14-22:    Access Code: [email or web address] URL: [email or web address] Date: 05/15/2022 Prepared by: Maebelle Munroe  Exercises - Single Leg Stance with Support  - 1 x daily - 7 x weekly - 1 sets - 2-3 reps - 10 sec hold    Pt instructed in HEP for LLE strengthening; Medbridge Access Code: [email or web address] URL: [email or web address] Date: 04/21/2022 Prepared by: Maebelle Munroe  Exercises - Sit to Stand  - 1 x daily - 7 x weekly - 1 sets - 10 reps - Clamshell with Resistance  - 1 x daily - 7 x weekly - 1 sets - 10 reps - 3 sec hold - Standing Hip Abduction with Resistance at Ankles and Counter Support  - 1 x daily - 7 x weekly - 1 sets - 10 reps - Standing Hip Extension with Resistance at  Ankles and Unilateral Counter Support  - 1 x daily - 7 x weekly - 1 sets - 10 reps - Standing Hip Flexion with Resistance Loop  - 1 x daily - 7 x weekly - 1 sets - 10 reps - 3 sec hold - Standing March with Counter Support  - 1 x daily - 7 x weekly - 1 sets - 10 reps - 3 hold - Standing Gastroc Stretch at Counter  - 1 x daily - 7 x weekly - 1 sets - 1 reps - 30 sec hold - Standing Bilateral Gastroc Stretch with Step  - 1 x daily - 7 x weekly - 1 sets - 1 reps - 20-30 hold   Access Code: [facility] URL: [email or web address] Date: 04/17/2022 Prepared by: Maebelle Munroe  Exercises - Sidelying Hip Abduction  - 1 x daily - 7 x weekly - 3 sets - 10 reps - Standing Heel Raise with Support  - 1 x daily - 7 x weekly - 3 sets - 10 reps - Single Leg Heel Raise  - 1 x daily - 7 x weekly - 3 sets - 10 reps - Heel Toe Raises with Counter Support  - 1 x daily - 7 x weekly - 3 sets - 10 reps   PATIENT EDUCATION: 05-14-22 Education details: added SLS to HEP  Person educated: Patient and Child(ren) Education method: Explanation, Demonstration, and Handouts Education comprehension: verbalized understanding and returned demonstration  HOME EXERCISE PROGRAM: Medbridge HEP  [email or web address]   GOALS: Goals  reviewed with patient? Yes  SHORT TERM GOALS: Same as LTG's   LONG TERM GOALS: Target date: 05-15-22  Perform 5x sit to stand transfers in </= 15 secs to demo increased LE strength and improved balance upon initial standing.  Baseline: 19.5 secs (4 reps); 17.75; 05-12-22  12.60 secs Goal status: Goal met 05-12-22  2.  Improve TUG score to </= 12.5 secs to reduce fall risk and demo improved functional mobility. Baseline:  13.5 secs with no device;  9.94 secs Goal status: Goal met 05-12-22  3.  Increase gait speed to >/= 3.2 ft/sec without device for increased gait efficiency. Baseline: 11.78 secs = 2.78 ft/sec no device;   05-12-22:  10.88, 10.31 = 3.18 ft/sec without device Goal status: Goal met 05-12-22  4.  Amb.  500' on flat, even surface without LOB and without Lt foot catching for improved safety with community ambulation/accessibility. Baseline:  Goal status: Goal met - 42' with no device   5.  Independent in HEP for LLE strengthening and balance. Baseline:  Goal status: Goal met 05-12-22  UPDATED LTG's:  TARGET DATE 06-19-22 (extended 1 week due to primary PT on vacation)              1.  Perform 5x sit to stand transfers in </= 10 secs to demo increased LE strength and improved balance upon initial standing.  Baseline: 19.5 secs (4 reps); 17.75; 05-12-22  12.60 secs Goal status: UPDATED GOAL  2.  Increase gait speed to >/= 3.5 ft/sec without device for increased gait efficiency. Baseline: 11.78 secs = 2.78 ft/sec no device;   05-12-22:  10.88, 10.31 = 3.18 ft/sec without device Goal status: UPDATED GOAL  3.  Amb.  700' (6 laps)  on flat, even surface without LOB and without Lt foot catching for improved safety with community ambulation/accessibility.  Baseline: 62' with no device with cues to increase initial heel contact to decrease shuffling   Goal status:  UPDATED GOAL  4.  Transfer floor to stand with UE support on object with supervision.  Baseline:  TBA  Goal status:   NEW  5.  Independent in updated HEP for balance and strengthening exercises.              Baseline:  TBA             Goal status:  NEW  ASSESSMENT:  CLINICAL IMPRESSION: PT session focused on balance exercises to improve SLS on each leg and also to increase vestibular input in maintaining balance.  Pt's legs started tremor due to fatigue with standing balance exercises inside // bars.  No PRE's for bil. LE's performed today due to fatigue.  Cont with POC.     OBJECTIVE IMPAIRMENTS: Abnormal gait, decreased activity tolerance, decreased balance, decreased cognition, and decreased strength.   ACTIVITY LIMITATIONS: bending, squatting, stairs, transfers, and locomotion level  PARTICIPATION LIMITATIONS: cleaning, laundry, driving, shopping, community activity, and yard work  PERSONAL FACTORS: Fitness, Past/current experiences, and 1 comorbidity: carotid stenosis (dizziness)  are also affecting patient's functional outcome.   REHAB POTENTIAL: Good  CLINICAL DECISION MAKING: Stable/uncomplicated  EVALUATION COMPLEXITY: Low  PLAN:  PT FREQUENCY: 2x/week  PT DURATION: 4 weeks  PLANNED INTERVENTIONS: Therapeutic exercises, Therapeutic activity, Neuromuscular re-education, Balance training, Gait training, Patient/Family education, Self Care, Stair training, and DME instructions  PLAN FOR NEXT SESSION: 10th visit progress note due next session:  Cont. Balance, strengthening and gait; SLS on each leg   Quaneisha Hanisch, Donavan Burnet, PT 05/27/2022, 7:04 PM

## 2022-05-27 ENCOUNTER — Encounter: Payer: Self-pay | Admitting: Physical Therapy

## 2022-05-28 ENCOUNTER — Ambulatory Visit: Payer: Medicare HMO | Admitting: Physical Therapy

## 2022-05-28 DIAGNOSIS — R2681 Unsteadiness on feet: Secondary | ICD-10-CM | POA: Diagnosis not present

## 2022-05-28 DIAGNOSIS — M6281 Muscle weakness (generalized): Secondary | ICD-10-CM

## 2022-05-28 DIAGNOSIS — R2689 Other abnormalities of gait and mobility: Secondary | ICD-10-CM

## 2022-05-28 NOTE — Therapy (Signed)
OUTPATIENT PHYSICAL THERAPY NEURO TREATMENT NOTE/10th VISIT PROGRESS NOTE    Progress Note Reporting Period 04-16-22 to 05-28-22  See note below for Objective Data and Assessment of Progress/Goals.        Patient Name: William Matthews MRN: [id number] DOB:03/05/1950, 72 y.o., male Today's Date: 05/29/2022   PCP: Loura Back, NP REFERRING PROVIDER: Osvaldo Shipper, MD  END OF SESSION:  PT End of Session - 05/29/22 1631     Visit Number 10    Number of Visits 14   renewal completed for 8 additional visits   Date for PT Re-Evaluation 06/19/22    Authorization Type [employer]    Authorization Time Period 04-16-22 - 06-09-22;  05-14-22 - 07-10-22    PT Start Time 1155   pt arrived 10" late for appt   PT Stop Time 1235    PT Time Calculation (min) 40 min    Equipment Utilized During Treatment --   no gait belt used due to bruised ribs on Rt side   Activity Tolerance Patient tolerated treatment well    Behavior During Therapy WFL for tasks assessed/performed                      Past Medical History:  Diagnosis Date   Anxiety    CAD (coronary artery disease)    a. s/p CABG 1994 with LIMA to D2 and LAD, SVG to D1 and ramus intermedius. b. acute inferior MI 2001 s/p rescue PTCA/stent to [facility].  b. NSTEMI 02/2014: s/p LHC with DES to oLCx c. 09/2015 PCI with DES to left main/ostial LCx   Chronic bronchitis    "they say I get it q yr" (10/03/2015)   CKD (chronic kidney disease), stage II    Depression    ED (erectile dysfunction)    GERD (gastroesophageal reflux disease)    Hyperlipidemia    Hypertension    Ischemic cardiomyopathy    a. EF 48% by nuc in 2012.   Myocardial infarction "several"   Nephrolithiasis    Obesity    Prediabetes    Past Surgical History:  Procedure Laterality Date   CARDIAC CATHETERIZATION N/A 02/19/2015   Procedure: Left Heart Cath and Cors/Grafts Angiography;  Surgeon: Kathleene Hazel, MD;  LAD & RCA 100%, LIMA-LAD 50%, SVG-OM-D1 100%  stenosis, distal limb D1-OM ok, CFX 99%   CARDIAC CATHETERIZATION  02/19/2015   Procedure: Coronary Stent Intervention;  Surgeon: Kathleene Hazel, MD; 3.5 x 16 mm Promus Premier DES to the ostial CFX    CARDIAC CATHETERIZATION N/A 10/03/2015   Procedure: Left Heart Cath and Cors/Grafts Angiography;  Surgeon: Peter M Swaziland, MD;  Location: [facility] INVASIVE CV LAB;  Service: Cardiovascular;  Laterality: N/A;   CARDIAC CATHETERIZATION N/A 10/03/2015   Procedure: Coronary Stent Intervention;  Surgeon: Peter M Swaziland, MD;  Location: [facility] INVASIVE CV LAB;  Service: Cardiovascular;  Laterality: N/A;  DES- Resolute 4.0x15 to distal left main into the ostial circumflex artery   CARDIAC CATHETERIZATION N/A 10/03/2015   Procedure: Intravascular Ultrasound/IVUS;  Surgeon: Peter M Swaziland, MD;  Location: [facility] INVASIVE CV LAB;  Service: Cardiovascular;  Laterality: N/A;   CORONARY ANGIOPLASTY WITH STENT PLACEMENT  1994 - 2001 X 5   "stents each time"   CORONARY ANGIOPLASTY WITH STENT PLACEMENT  10/03/2015   CORONARY ARTERY BYPASS GRAFT  1994   LIMA-D2-LAD, SVG-D1-OM2   CYSTOSCOPY W/ STONE MANIPULATION     Patient Active Problem List   Diagnosis Date Noted   Slurred  speech 03/26/2022   Chronic kidney disease, stage 3a 03/26/2022   Hypothyroidism 03/26/2022   Paroxysmal atrial fibrillation 03/26/2022   Heart failure with mildly reduced ejection fraction (HFmrEF) 03/26/2022   Pain due to onychomycosis of toenails of both feet 08/23/2020   BPH with obstruction/lower urinary tract symptoms 12/18/2019   Anticoagulant long-term use 09/07/2019   Acute on chronic systolic congestive heart failure 09/07/2019   Mitral regurgitation 09/07/2019   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   Atrial flutter 08/15/2019   Unstable angina 10/03/2015   NSTEMI (non-ST elevated myocardial infarction) 02/20/2015   Hyperlipidemia    Prediabetes    Obesity    ED (erectile dysfunction)    CAD (coronary artery disease)     CKD (chronic kidney disease), stage II 02/18/2015   Nonspecific abnormal unspecified cardiovascular function study 08/11/2010   Unstable angina pectoris (HCC) 07/02/2010   Essential hypertension 07/02/2010   Erectile dysfunction 07/02/2010    ONSET DATE: 03-26-22  REFERRING DIAG:  Diagnosis  G45.9 (ICD-10-CM) - TIA (transient ischemic attack)    THERAPY DIAG:  Muscle weakness (generalized)  Unsteadiness on feet  Other abnormalities of gait and mobility  Rationale for Evaluation and Treatment: Rehabilitation  SUBJECTIVE:                                                                                                                                                                                             SUBJECTIVE STATEMENT: Pt reports he drove himself to PT today; reports no changes since previous session; Rt side is still sore due to bruised ribs due to fall   Pt accompanied by: self  PERTINENT HISTORY:  72 y.o. male with medical history significant for PAF on Eliquis, CAD s/p CABG and DES, CHF, CKD stage IIIa, HTN, HLD, hypothyroidism, BPH who presented to the ED for evaluation of left facial droop and slurred speech. Patient states he initially noted left-sided facial droop beginning at the end of December associated with slurred speech.  These changes resolved the next day.  Since then he has had intermittent episodes of dizziness without syncope.  Patient was hospitalized for further management. (2-15 - 03-28-22)    PAIN:  Are you having pain? Pt reports soreness due to bruised ribs on Rt side - no pain, just soreness Relieving factors:  Lidocaine patch helps Aggravating factors: no specific  PRECAUTIONS: intermittent dizziness due to stenosis: Fall   WEIGHT BEARING RESTRICTIONS: No  FALLS: Has patient fallen in last 6 months? Yes. Number of falls approx. 15  LIVING ENVIRONMENT: Lives with: lives alone Lives in: House/apartment Stairs: No Has following  equipment at home: None  PLOF: Independent  PATIENT GOALS: Improve walking and balance   OBJECTIVE:   GAIT: Gait pattern: step through pattern, decreased ankle dorsiflexion- Left, and Left foot flat Distance walked: 115' Assistive device utilized: None Level of assistance: SBA Comments: improved Lt foot clearance noted during swing phase of gait  TODAY'S TREATMENT:                                                                                                                              DATE: 05-28-22  THEREX:  Pt performed LLE strengthening exercises - LLE & RLE step up exercise onto 6" step 10 reps with UE support on hand rail Tapping 1st step with LLE and with RLE - 5 reps; tapping 2nd step 5 reps with each foot with UE support prn  Stretches on step - runner's stretch for hamstring/heel cord stretching bil. LE's 20 sec hold:  Standing heel raises bil. LE's 10 reps with UE support;  RLE 10 reps;  LLE 10 reps with bil. UE support on // bars  Standing Lt hip flexion, extension and abduction with 4# on LLE 10 reps each; standing 1/2 march LLE with 4# weight on leg 10 reps SciFit level 3.0 x 5" with bil. Ue's and LE's  Standing Balance: Surface: Airex Position: Feet Hip Width Apart Completed with: Eyes Open and Eyes Closed; Head Turns x 5 Reps and Head Nods x 5 Reps  Standing on floor - pt performed cone taps to 2 cones; 5 reps each foot straight ahead; then 5 reps diagonal taps with UE support on // bars prn    Rockerboard - placed inside // bars - 10 reps EO without UE support; 1 finger support on each hand - 10 reps with EC x 2 reps Pt held board steady - performed horizontal head turns 5 reps each with UE support prn and then vertical head turns 5 reps with UE support with EO with targets for improved gaze stabilization  Standing on Airex - 10 sec holding steady with EC without UE support with CGA Marching on Airex 10 reps with EO - no head turns  Stepping over and back  of black balance beam 5 reps each LE without UE support on // bars - standing on floor  05-19-22 Added standing on pillow or blankets at home with EO and EC to HEP MedbridgeAccess Code: [email or web address] URL: [email or web address] Date: 05/20/2022 Prepared by: Maebelle Munroe  Exercises - Standing Balance with Eyes Closed on Foam  - 1 x daily - 7 x weekly - 3 sets - 10 reps   Medbridge HEP - 05-14-22:   Access Code: [email or web address] URL: [email or web address] Date: 05/15/2022 Prepared by: Maebelle Munroe  Exercises - Single Leg Stance with Support  - 1 x daily - 7 x weekly - 1 sets - 2-3 reps - 10 sec hold    Pt instructed in HEP for LLE strengthening; Medbridge Access Code: [email or web address] URL: [email or web address] Date: 04/21/2022 Prepared by: Maebelle Munroe  Exercises - Sit to Stand  - 1 x daily - 7 x weekly - 1 sets - 10 reps - Clamshell with Resistance  - 1 x daily - 7 x weekly - 1 sets - 10 reps - 3 sec hold - Standing Hip Abduction with Resistance at Ankles and Counter Support  - 1 x daily - 7 x weekly - 1 sets - 10 reps - Standing Hip Extension with Resistance at Ankles and Unilateral Counter Support  - 1 x daily - 7 x weekly - 1 sets - 10 reps - Standing Hip Flexion with Resistance Loop  - 1 x daily - 7 x weekly - 1 sets - 10 reps - 3 sec hold - Standing March with Counter Support  - 1 x daily - 7 x weekly - 1 sets - 10 reps - 3 hold - Standing Gastroc Stretch at Counter  - 1 x daily - 7 x weekly - 1 sets - 1 reps - 30 sec hold - Standing Bilateral Gastroc Stretch with Step  - 1 x daily - 7 x weekly - 1 sets - 1 reps - 20-30 hold   Access Code: [facility] URL: [email or web address] Date: 04/17/2022 Prepared by: Maebelle Munroe  Exercises - Sidelying Hip Abduction  - 1 x daily - 7 x weekly - 3 sets - 10 reps - Standing Heel Raise with Support  - 1 x daily - 7 x weekly - 3 sets - 10 reps - Single Leg Heel Raise  - 1 x daily - 7 x weekly - 3 sets  - 10 reps - Heel Toe Raises with Counter Support  - 1 x daily - 7 x weekly - 3 sets - 10 reps   PATIENT EDUCATION: 05-14-22 Education details: added SLS to HEP  Person educated: Patient and Child(ren) Education method: Explanation, Demonstration, and Handouts Education comprehension: verbalized understanding and returned demonstration  HOME EXERCISE PROGRAM: Medbridge HEP  [email or web address]   GOALS: Goals reviewed with patient? Yes  SHORT TERM GOALS: Same as LTG's   LONG TERM GOALS: Target date: 05-15-22  Perform 5x sit to stand transfers in </= 15 secs to demo increased LE strength and improved balance upon initial standing.  Baseline: 19.5 secs (4 reps); 17.75; 05-12-22  12.60 secs Goal status: Goal met 05-12-22  2.  Improve TUG score to </= 12.5 secs to reduce fall risk and demo improved functional mobility. Baseline:  13.5 secs with no device;  9.94 secs Goal status: Goal met 05-12-22  3.  Increase gait speed to >/= 3.2 ft/sec without device for increased gait efficiency. Baseline: 11.78 secs = 2.78 ft/sec no device;   05-12-22:  10.88, 10.31 = 3.18 ft/sec without device Goal status: Goal met 05-12-22  4.  Amb.  500' on flat, even surface without LOB and without Lt foot catching for improved safety with community ambulation/accessibility. Baseline:  Goal status: Goal met - 56' with no device   5.  Independent in HEP for LLE strengthening and balance. Baseline:  Goal status: Goal met 05-12-22  UPDATED LTG's:  TARGET DATE 06-19-22 (extended 1 week due to primary PT on vacation)              1.  Perform 5x sit to stand transfers in </= 10 secs to demo increased LE strength and improved balance upon initial standing.  Baseline: 19.5 secs (4 reps); 17.75; 05-12-22  12.60 secs Goal status: UPDATED GOAL  2.  Increase gait speed to >/= 3.5 ft/sec without device for  increased gait efficiency. Baseline: 11.78 secs = 2.78 ft/sec no device;   05-12-22:  10.88, 10.31 = 3.18 ft/sec without device Goal  status: UPDATED GOAL  3.  Amb.  700' (6 laps)  on flat, even surface without LOB and without Lt foot catching for improved safety with community ambulation/accessibility.  Baseline: 84' with no device with cues to increase initial heel contact to decrease shuffling   Goal status:  UPDATED GOAL  4.  Transfer floor to stand with UE support on object with supervision.  Baseline:  TBA  Goal status:  NEW  5.  Independent in updated HEP for balance and strengthening exercises.              Baseline:  TBA             Goal status:  NEW  ASSESSMENT:  CLINICAL IMPRESSION: This 10th visit progress note covers dates 04-16-22 - 05-28-22.  Pt has met 5/5 STG's as assessed on 05-12-22.  Mobility this week has been limited by bruised ribs on Rt side [place], however, pt continues to be modified independent with ambulation without use of assistive device.  Pt's LLE remains slightly weaker than RLE.  Pt is progressing well towards LTG's.  Cont with POC.     OBJECTIVE IMPAIRMENTS: Abnormal gait, decreased activity tolerance, decreased balance, decreased cognition, and decreased strength.   ACTIVITY LIMITATIONS: bending, squatting, stairs, transfers, and locomotion level  PARTICIPATION LIMITATIONS: cleaning, laundry, driving, shopping, community activity, and yard work  PERSONAL FACTORS: Fitness, Past/current experiences, and 1 comorbidity: carotid stenosis (dizziness)  are also affecting patient's functional outcome.   REHAB POTENTIAL: Good  CLINICAL DECISION MAKING: Stable/uncomplicated  EVALUATION COMPLEXITY: Low  PLAN:  PT FREQUENCY: 2x/week  PT DURATION: 4 weeks  PLANNED INTERVENTIONS: Therapeutic exercises, Therapeutic activity, Neuromuscular re-education, Balance training, Gait training, Patient/Family education, Self Care, Stair training, and DME instructions  PLAN FOR NEXT SESSION: Cont. Balance, strengthening and gait; SLS on each  leg   Dora Simeone, Donavan Burnet, PT 05/29/2022, 4:36 PM

## 2022-05-29 ENCOUNTER — Encounter: Payer: Self-pay | Admitting: Physical Therapy

## 2022-06-02 ENCOUNTER — Ambulatory Visit: Payer: Medicare HMO | Admitting: Physical Therapy

## 2022-06-04 ENCOUNTER — Ambulatory Visit: Payer: Medicare HMO | Admitting: Physical Therapy

## 2022-06-04 ENCOUNTER — Encounter: Payer: Self-pay | Admitting: Physical Therapy

## 2022-06-04 DIAGNOSIS — M6281 Muscle weakness (generalized): Secondary | ICD-10-CM | POA: Diagnosis not present

## 2022-06-04 DIAGNOSIS — R2689 Other abnormalities of gait and mobility: Secondary | ICD-10-CM | POA: Diagnosis not present

## 2022-06-04 DIAGNOSIS — R2681 Unsteadiness on feet: Secondary | ICD-10-CM

## 2022-06-04 NOTE — Therapy (Signed)
OUTPATIENT PHYSICAL THERAPY NEURO TREATMENT NOTE       Patient Name: William Matthews MRN: [id number] DOB:1950-03-18, 72 y.o., male Today's Date: 06/04/2022   PCP: Loura Back, NP REFERRING PROVIDER: Osvaldo Shipper, MD  END OF SESSION:  PT End of Session - 06/04/22 1838     Visit Number 11    Number of Visits 14   renewal completed for 8 additional visits   Date for PT Re-Evaluation 06/19/22    Authorization Type [employer]    Authorization Time Period 04-16-22 - 06-09-22;  05-14-22 - 07-10-22    PT Start Time 1106    PT Stop Time 1152    PT Time Calculation (min) 46 min    Equipment Utilized During Treatment --   no gait belt used due to bruised ribs on Rt side   Activity Tolerance Patient tolerated treatment well    Behavior During Therapy WFL for tasks assessed/performed                       Past Medical History:  Diagnosis Date   Anxiety    CAD (coronary artery disease)    a. s/p CABG 1994 with LIMA to D2 and LAD, SVG to D1 and ramus intermedius. b. acute inferior MI 2001 s/p rescue PTCA/stent to [facility].  b. NSTEMI 02/2014: s/p LHC with DES to oLCx c. 09/2015 PCI with DES to left main/ostial LCx   Chronic bronchitis (HCC)    "they say I get it q yr" (10/03/2015)   CKD (chronic kidney disease), stage II    Depression    ED (erectile dysfunction)    GERD (gastroesophageal reflux disease)    Hyperlipidemia    Hypertension    Ischemic cardiomyopathy    a. EF 48% by nuc in 2012.   Myocardial infarction [facility]) "several"   Nephrolithiasis    Obesity    Prediabetes    Past Surgical History:  Procedure Laterality Date   CARDIAC CATHETERIZATION N/A 02/19/2015   Procedure: Left Heart Cath and Cors/Grafts Angiography;  Surgeon: Kathleene Hazel, MD;  LAD & RCA 100%, LIMA-LAD 50%, SVG-OM-D1 100% stenosis, distal limb D1-OM ok, CFX 99%   CARDIAC CATHETERIZATION  02/19/2015   Procedure: Coronary Stent Intervention;  Surgeon: Kathleene Hazel, MD; 3.5 x 16 mm  Promus Premier DES to the ostial CFX    CARDIAC CATHETERIZATION N/A 10/03/2015   Procedure: Left Heart Cath and Cors/Grafts Angiography;  Surgeon: Peter M Swaziland, MD;  Location: [facility] INVASIVE CV LAB;  Service: Cardiovascular;  Laterality: N/A;   CARDIAC CATHETERIZATION N/A 10/03/2015   Procedure: Coronary Stent Intervention;  Surgeon: Peter M Swaziland, MD;  Location: [facility] INVASIVE CV LAB;  Service: Cardiovascular;  Laterality: N/A;  DES- Resolute 4.0x15 to distal left main into the ostial circumflex artery   CARDIAC CATHETERIZATION N/A 10/03/2015   Procedure: Intravascular Ultrasound/IVUS;  Surgeon: Peter M Swaziland, MD;  Location: [facility] INVASIVE CV LAB;  Service: Cardiovascular;  Laterality: N/A;   CORONARY ANGIOPLASTY WITH STENT PLACEMENT  1994 - 2001 X 5   "stents each time"   CORONARY ANGIOPLASTY WITH STENT PLACEMENT  10/03/2015   CORONARY ARTERY BYPASS GRAFT  1994   LIMA-D2-LAD, SVG-D1-OM2   CYSTOSCOPY W/ STONE MANIPULATION     Patient Active Problem List   Diagnosis Date Noted   Slurred speech 03/26/2022   Chronic kidney disease, stage 3a 03/26/2022   Hypothyroidism 03/26/2022   Paroxysmal atrial fibrillation (HCC) 03/26/2022   Heart failure with mildly reduced ejection  fraction (HFmrEF) 03/26/2022   Pain due to onychomycosis of toenails of both feet 08/23/2020   BPH with obstruction/lower urinary tract symptoms 12/18/2019   Anticoagulant long-term use 09/07/2019   Acute on chronic systolic congestive heart failure (HCC) 09/07/2019   Mitral regurgitation 09/07/2019   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   Atrial flutter (HCC) 08/15/2019   Unstable angina (HCC) 10/03/2015   NSTEMI (non-ST elevated myocardial infarction) (HCC) 02/20/2015   Hyperlipidemia    Prediabetes    Obesity    ED (erectile dysfunction)    CAD (coronary artery disease)    CKD (chronic kidney disease), stage II 02/18/2015   Nonspecific abnormal unspecified cardiovascular function study 08/11/2010    Unstable angina pectoris (HCC) 07/02/2010   Essential hypertension 07/02/2010   Erectile dysfunction 07/02/2010    ONSET DATE: 03-26-22  REFERRING DIAG:  Diagnosis  G45.9 (ICD-10-CM) - TIA (transient ischemic attack)    THERAPY DIAG:  Muscle weakness (generalized)  Unsteadiness on feet  Other abnormalities of gait and mobility  Rationale for Evaluation and Treatment: Rehabilitation  SUBJECTIVE:                                                                                                                                                                                             SUBJECTIVE STATEMENT: Pt reports he had another fall earlier this week (reason for cancellation on Tuesday this week) - says he was outside in tall grass and lost his balance and fell - hurt Rt side of rib cage but is doing better now  Pt accompanied by: self  PERTINENT HISTORY:  72 y.o. male with medical history significant for PAF on Eliquis, CAD s/p CABG and DES, CHF, CKD stage IIIa, HTN, HLD, hypothyroidism, BPH who presented to the ED for evaluation of left facial droop and slurred speech. Patient states he initially noted left-sided facial droop beginning at the end of December associated with slurred speech.  These changes resolved the next day.  Since then he has had intermittent episodes of dizziness without syncope.  Patient was hospitalized for further management. (2-15 - 03-28-22)    PAIN:  Are you having pain? Pt reports soreness due to bruised ribs on Rt side - no pain, just soreness Relieving factors:  Lidocaine patch helps Aggravating factors: no specific  PRECAUTIONS: intermittent dizziness due to stenosis: Fall   WEIGHT BEARING RESTRICTIONS: No  FALLS: Has patient fallen in last 6 months? Yes. Number of falls approx. 15  LIVING ENVIRONMENT: Lives with: lives alone Lives in: House/apartment Stairs: No Has following equipment at home: None  PLOF: Independent  PATIENT GOALS:  Improve walking and  balance   OBJECTIVE:   GAIT: Gait pattern: step through pattern, decreased ankle dorsiflexion- Left, and Left foot flat Distance walked: 115' Assistive device utilized: None Level of assistance: SBA Comments: improved Lt foot clearance noted during swing phase of gait  TODAY'S TREATMENT:                                                                                                                              DATE: 06-04-22  THEREX:  Pt performed LLE strengthening exercises - LLE & RLE step up exercise onto 6" step 10 reps with UE support on hand rail Tapping 1st step with LLE and with RLE - 5 reps; tapping 2nd step 5 reps with each foot with UE support prn  Sit to stand from mat table 5 reps without UE support   Standing heel raises bil. LE's 10 reps with UE support;  RLE 10 reps;  LLE 10 reps with bil. UE support on // bars  Standing Lt hip flexion, extension and abduction with 4# on LLE 10 reps each; standing 1/2 march LLE with 4# weight on leg 10 reps   Standing Balance: Surface: Airex Position: Feet Hip Width Apart Completed with: Eyes Open and Eyes Closed; Head Turns x 5 Reps and Head Nods x 5 Reps  Standing on floor - pt performed cone taps to 2 cones; 5 reps each foot straight ahead; then 5 reps diagonal taps with UE support on // bars prn    Rockerboard - placed inside // bars - 10 reps EO with min. UE support, then 10 reps without UE support; Pt performed stepping down to floor with Rt foot 10 reps and pushing back up onto board with LLE with UE support prn  Standing on Airex - 10 sec holding steady  Marching on Airex 10 reps with EO - no head turns  Stepping over and back of black balance beam 5 reps each LE without UE support on // bars - standing on floor - cues to lift Lt leg high for improved clearance  Sidestepping 10' x 4 reps inside // bars with squats without UE support   05-19-22 Added standing on pillow or blankets at home with EO and  EC to HEP MedbridgeAccess Code: [email or web address] URL: [email or web address] Date: 05/20/2022 Prepared by: Maebelle Munroe  Exercises - Standing Balance with Eyes Closed on Foam  - 1 x daily - 7 x weekly - 3 sets - 10 reps   Medbridge HEP - 05-14-22:   Access Code: [email or web address] URL: [email or web address] Date: 05/15/2022 Prepared by: Maebelle Munroe  Exercises - Single Leg Stance with Support  - 1 x daily - 7 x weekly - 1 sets - 2-3 reps - 10 sec hold    Pt instructed in HEP for LLE strengthening; Medbridge Access Code: [email or web address] URL: [email or web address] Date: 04/21/2022 Prepared by: Maebelle Munroe  Exercises - Sit to Stand  - 1 x daily - 7 x weekly - 1 sets -  10 reps - Clamshell with Resistance  - 1 x daily - 7 x weekly - 1 sets - 10 reps - 3 sec hold - Standing Hip Abduction with Resistance at Ankles and Counter Support  - 1 x daily - 7 x weekly - 1 sets - 10 reps - Standing Hip Extension with Resistance at Ankles and Unilateral Counter Support  - 1 x daily - 7 x weekly - 1 sets - 10 reps - Standing Hip Flexion with Resistance Loop  - 1 x daily - 7 x weekly - 1 sets - 10 reps - 3 sec hold - Standing March with Counter Support  - 1 x daily - 7 x weekly - 1 sets - 10 reps - 3 hold - Standing Gastroc Stretch at Counter  - 1 x daily - 7 x weekly - 1 sets - 1 reps - 30 sec hold - Standing Bilateral Gastroc Stretch with Step  - 1 x daily - 7 x weekly - 1 sets - 1 reps - 20-30 hold   Access Code: [facility] URL: [email or web address] Date: 04/17/2022 Prepared by: Maebelle Munroe  Exercises - Sidelying Hip Abduction  - 1 x daily - 7 x weekly - 3 sets - 10 reps - Standing Heel Raise with Support  - 1 x daily - 7 x weekly - 3 sets - 10 reps - Single Leg Heel Raise  - 1 x daily - 7 x weekly - 3 sets - 10 reps - Heel Toe Raises with Counter Support  - 1 x daily - 7 x weekly - 3 sets - 10 reps   PATIENT EDUCATION: 05-14-22 Education details: added  SLS to HEP  Person educated: Patient and Child(ren) Education method: Explanation, Demonstration, and Handouts Education comprehension: verbalized understanding and returned demonstration  HOME EXERCISE PROGRAM: Medbridge HEP  [email or web address]   GOALS: Goals reviewed with patient? Yes  SHORT TERM GOALS: Same as LTG's   LONG TERM GOALS: Target date: 05-15-22  Perform 5x sit to stand transfers in </= 15 secs to demo increased LE strength and improved balance upon initial standing.  Baseline: 19.5 secs (4 reps); 17.75; 05-12-22  12.60 secs Goal status: Goal met 05-12-22  2.  Improve TUG score to </= 12.5 secs to reduce fall risk and demo improved functional mobility. Baseline:  13.5 secs with no device;  9.94 secs Goal status: Goal met 05-12-22  3.  Increase gait speed to >/= 3.2 ft/sec without device for increased gait efficiency. Baseline: 11.78 secs = 2.78 ft/sec no device;   05-12-22:  10.88, 10.31 = 3.18 ft/sec without device Goal status: Goal met 05-12-22  4.  Amb.  500' on flat, even surface without LOB and without Lt foot catching for improved safety with community ambulation/accessibility. Baseline:  Goal status: Goal met - 39' with no device   5.  Independent in HEP for LLE strengthening and balance. Baseline:  Goal status: Goal met 05-12-22  UPDATED LTG's:  TARGET DATE 06-19-22 (extended 1 week due to primary PT on vacation)              1.  Perform 5x sit to stand transfers in </= 10 secs to demo increased LE strength and improved balance upon initial standing.  Baseline: 19.5 secs (4 reps); 17.75; 05-12-22  12.60 secs Goal status: UPDATED GOAL  2.  Increase gait speed to >/= 3.5 ft/sec without device for increased gait efficiency. Baseline: 11.78 secs = 2.78 ft/sec no device;   05-12-22:  10.88, 10.31 =  3.18 ft/sec without device Goal status: UPDATED GOAL  3.  Amb.  700' (6 laps)  on flat, even surface without LOB and without Lt foot catching for improved safety with community  ambulation/accessibility.  Baseline: 36' with no device with cues to increase initial heel contact to decrease shuffling   Goal status:  UPDATED GOAL  4.  Transfer floor to stand with UE support on object with supervision.  Baseline:  TBA  Goal status:  NEW  5.  Independent in updated HEP for balance and strengthening exercises.              Baseline:  TBA             Goal status:  NEW  ASSESSMENT:  CLINICAL IMPRESSION: PT session focused on LLE strengthening and balance training on compliant and noncompliant surfaces.  Pt using SPC for assistance with ambulation to today's appt.  Pt has decreased SLS on bil. Legs with LLE more impaired than RLE.   Pt reports not doing HEP which is impacting pt's progress.  Cont with POC.     OBJECTIVE IMPAIRMENTS: Abnormal gait, decreased activity tolerance, decreased balance, decreased cognition, and decreased strength.   ACTIVITY LIMITATIONS: bending, squatting, stairs, transfers, and locomotion level  PARTICIPATION LIMITATIONS: cleaning, laundry, driving, shopping, community activity, and yard work  PERSONAL FACTORS: Fitness, Past/current experiences, and 1 comorbidity: carotid stenosis (dizziness)  are also affecting patient's functional outcome.   REHAB POTENTIAL: Good  CLINICAL DECISION MAKING: Stable/uncomplicated  EVALUATION COMPLEXITY: Low  PLAN:  PT FREQUENCY: 2x/week  PT DURATION: 4 weeks  PLANNED INTERVENTIONS: Therapeutic exercises, Therapeutic activity, Neuromuscular re-education, Balance training, Gait training, Patient/Family education, Self Care, Stair training, and DME instructions  PLAN FOR NEXT SESSION: Cont. Balance, strengthening and gait; SLS on each leg   Zonya Gudger, Donavan Burnet, PT 06/04/2022, 6:44 PM

## 2022-06-16 ENCOUNTER — Ambulatory Visit: Payer: Medicare HMO | Admitting: Neurology

## 2022-06-16 ENCOUNTER — Encounter: Payer: Self-pay | Admitting: Neurology

## 2022-06-16 ENCOUNTER — Ambulatory Visit: Payer: No Typology Code available for payment source | Attending: Internal Medicine | Admitting: Physical Therapy

## 2022-06-16 DIAGNOSIS — R2689 Other abnormalities of gait and mobility: Secondary | ICD-10-CM | POA: Diagnosis present

## 2022-06-16 DIAGNOSIS — M6281 Muscle weakness (generalized): Secondary | ICD-10-CM | POA: Diagnosis present

## 2022-06-16 DIAGNOSIS — R2681 Unsteadiness on feet: Secondary | ICD-10-CM | POA: Diagnosis present

## 2022-06-16 NOTE — Therapy (Signed)
OUTPATIENT PHYSICAL THERAPY NEURO TREATMENT NOTE       Patient Name: William Matthews MRN: [id number] DOB:1950-08-25, 72 y.o., male Today's Date: 06/17/2022   PCP: Loura Back, NP REFERRING PROVIDER: Osvaldo Shipper, MD  END OF SESSION:  PT End of Session - 06/17/22 1703     Visit Number 12    Number of Visits 14   renewal completed for 8 additional visits   Date for PT Re-Evaluation 06/19/22    Authorization Type [employer]    Authorization Time Period 04-16-22 - 06-09-22;  05-14-22 - 07-10-22    PT Start Time 1317    PT Stop Time 1401    PT Time Calculation (min) 44 min    Equipment Utilized During Treatment --   no gait belt used due to bruised ribs on Rt side   Activity Tolerance Patient tolerated treatment well    Behavior During Therapy WFL for tasks assessed/performed                        Past Medical History:  Diagnosis Date   Anxiety    CAD (coronary artery disease)    a. s/p CABG 1994 with LIMA to D2 and LAD, SVG to D1 and ramus intermedius. b. acute inferior MI 2001 s/p rescue PTCA/stent to [facility].  b. NSTEMI 02/2014: s/p LHC with DES to oLCx c. 09/2015 PCI with DES to left main/ostial LCx   Chronic bronchitis (HCC)    "they say I get it q yr" (10/03/2015)   CKD (chronic kidney disease), stage II    Depression    ED (erectile dysfunction)    GERD (gastroesophageal reflux disease)    Hyperlipidemia    Hypertension    Ischemic cardiomyopathy    a. EF 48% by nuc in 2012.   Myocardial infarction [facility]) "several"   Nephrolithiasis    Obesity    Prediabetes    Past Surgical History:  Procedure Laterality Date   CARDIAC CATHETERIZATION N/A 02/19/2015   Procedure: Left Heart Cath and Cors/Grafts Angiography;  Surgeon: Kathleene Hazel, MD;  LAD & RCA 100%, LIMA-LAD 50%, SVG-OM-D1 100% stenosis, distal limb D1-OM ok, CFX 99%   CARDIAC CATHETERIZATION  02/19/2015   Procedure: Coronary Stent Intervention;  Surgeon: Kathleene Hazel, MD; 3.5 x 16  mm Promus Premier DES to the ostial CFX    CARDIAC CATHETERIZATION N/A 10/03/2015   Procedure: Left Heart Cath and Cors/Grafts Angiography;  Surgeon: Peter M Swaziland, MD;  Location: [facility] INVASIVE CV LAB;  Service: Cardiovascular;  Laterality: N/A;   CARDIAC CATHETERIZATION N/A 10/03/2015   Procedure: Coronary Stent Intervention;  Surgeon: Peter M Swaziland, MD;  Location: [facility] INVASIVE CV LAB;  Service: Cardiovascular;  Laterality: N/A;  DES- Resolute 4.0x15 to distal left main into the ostial circumflex artery   CARDIAC CATHETERIZATION N/A 10/03/2015   Procedure: Intravascular Ultrasound/IVUS;  Surgeon: Peter M Swaziland, MD;  Location: [facility] INVASIVE CV LAB;  Service: Cardiovascular;  Laterality: N/A;   CORONARY ANGIOPLASTY WITH STENT PLACEMENT  1994 - 2001 X 5   "stents each time"   CORONARY ANGIOPLASTY WITH STENT PLACEMENT  10/03/2015   CORONARY ARTERY BYPASS GRAFT  1994   LIMA-D2-LAD, SVG-D1-OM2   CYSTOSCOPY W/ STONE MANIPULATION     Patient Active Problem List   Diagnosis Date Noted   Slurred speech 03/26/2022   Chronic kidney disease, stage 3a (HCC) 03/26/2022   Hypothyroidism 03/26/2022   Paroxysmal atrial fibrillation (HCC) 03/26/2022   Heart failure with mildly  reduced ejection fraction (HFmrEF) (HCC) 03/26/2022   Pain due to onychomycosis of toenails of both feet 08/23/2020   BPH with obstruction/lower urinary tract symptoms 12/18/2019   Anticoagulant long-term use 09/07/2019   Acute on chronic systolic congestive heart failure (HCC) 09/07/2019   Mitral regurgitation 09/07/2019   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   Atrial flutter (HCC) 08/15/2019   Unstable angina (HCC) 10/03/2015   NSTEMI (non-ST elevated myocardial infarction) (HCC) 02/20/2015   Hyperlipidemia    Prediabetes    Obesity    ED (erectile dysfunction)    CAD (coronary artery disease)    CKD (chronic kidney disease), stage II 02/18/2015   Nonspecific abnormal unspecified cardiovascular function study  08/11/2010   Unstable angina pectoris (HCC) 07/02/2010   Essential hypertension 07/02/2010   Erectile dysfunction 07/02/2010    ONSET DATE: 03-26-22  REFERRING DIAG:  Diagnosis  G45.9 (ICD-10-CM) - TIA (transient ischemic attack)    THERAPY DIAG:  Muscle weakness (generalized)  Unsteadiness on feet  Other abnormalities of gait and mobility  Rationale for Evaluation and Treatment: Rehabilitation  SUBJECTIVE:                                                                                                                                                                                             SUBJECTIVE STATEMENT: Pt reports no changes - has not had any falls since previous session 2 weeks ago  Pt accompanied by: self  PERTINENT HISTORY:  72 y.o. male with medical history significant for PAF on Eliquis, CAD s/p CABG and DES, CHF, CKD stage IIIa, HTN, HLD, hypothyroidism, BPH who presented to the ED for evaluation of left facial droop and slurred speech. Patient states he initially noted left-sided facial droop beginning at the end of December associated with slurred speech.  These changes resolved the next day.  Since then he has had intermittent episodes of dizziness without syncope.  Patient was hospitalized for further management. (2-15 - 03-28-22)    PAIN:  Are you having pain? No pain Relieving factors:  Lidocaine patch helps Aggravating factors: no specific  PRECAUTIONS: intermittent dizziness due to stenosis: Fall   WEIGHT BEARING RESTRICTIONS: No  FALLS: Has patient fallen in last 6 months? Yes. Number of falls approx. 15  LIVING ENVIRONMENT: Lives with: lives alone Lives in: House/apartment Stairs: No Has following equipment at home: None  PLOF: Independent  PATIENT GOALS: Improve walking and balance   OBJECTIVE:   GAIT: Gait pattern: step through pattern, decreased ankle dorsiflexion- Left, and Left foot flat Distance walked: 115' Assistive device  utilized: None Level of assistance: SBA Comments: improved Lt foot clearance  noted during swing phase of gait  TODAY'S TREATMENT:                                                                                                                              DATE: 06-16-22  THEREX:  Pt performed LLE strengthening exercises - LLE & RLE step up exercise onto 6" step 10 reps with UE support on hand rail Tapping 1st step with LLE and with RLE - 5 reps; tapping 2nd step 5 reps with each foot with UE support prn  Sit to stand from mat table 10 reps without UE support with feet on Airex for compliant surface training  Standing heel raises bil. LE's 10 reps with UE support;  RLE 10 reps;  LLE 10 reps with bil. UE support on // bars  Leg press 70# bil. LE's 3 sets 10 reps - cues for controlled return  Standing Lt hip flexion, extension and abduction with 4# on LLE 10 reps each; standing 1/2 march LLE with 4# weight on leg 10 reps   Standing Balance: Surface: Airex Position: Feet Hip Width Apart Completed with: Eyes Open and Eyes Closed; Head Turns x 5 Reps and Head Nods x 5 Reps  Standing on floor - pt performed cone taps to 2 cones; 5 reps each foot straight ahead; then 5 reps diagonal taps with UE support on // bars prn    Rockerboard - placed inside // bars - 10 reps EO with min. UE support, then 10 reps without UE support; Pt performed stepping down to floor with Rt foot 10 reps and pushing back up onto board with LLE with UE support prn Standing on rockerboard - horizontal head turns 5 reps with UE support prn; vertical head turns 5 reps with UE support prn  Standing on Airex - 10 sec holding steady with EC Marching on Airex 10 reps with EO - no head turns  Stepping over and back of black balance beam 5 reps each LE without UE support on // bars - standing on floor - cues to lift Lt leg high for improved clearance Stepping laterally over and back of beam 5 reps each leg without UE support     05-19-22 Added standing on pillow or blankets at home with EO and EC to HEP MedbridgeAccess Code: [email or web address] URL: [email or web address] Date: 05/20/2022 Prepared by: Maebelle Munroe  Exercises - Standing Balance with Eyes Closed on Foam  - 1 x daily - 7 x weekly - 3 sets - 10 reps   Medbridge HEP - 05-14-22:   Access Code: [email or web address] URL: [email or web address] Date: 05/15/2022 Prepared by: Maebelle Munroe  Exercises - Single Leg Stance with Support  - 1 x daily - 7 x weekly - 1 sets - 2-3 reps - 10 sec hold    Pt instructed in HEP for LLE strengthening; Medbridge Access Code: [email or web address] URL: [email or web address] Date: 04/21/2022 Prepared by: Maebelle Munroe  Exercises - Sit to Stand  - 1  x daily - 7 x weekly - 1 sets - 10 reps - Clamshell with Resistance  - 1 x daily - 7 x weekly - 1 sets - 10 reps - 3 sec hold - Standing Hip Abduction with Resistance at Ankles and Counter Support  - 1 x daily - 7 x weekly - 1 sets - 10 reps - Standing Hip Extension with Resistance at Ankles and Unilateral Counter Support  - 1 x daily - 7 x weekly - 1 sets - 10 reps - Standing Hip Flexion with Resistance Loop  - 1 x daily - 7 x weekly - 1 sets - 10 reps - 3 sec hold - Standing March with Counter Support  - 1 x daily - 7 x weekly - 1 sets - 10 reps - 3 hold - Standing Gastroc Stretch at Counter  - 1 x daily - 7 x weekly - 1 sets - 1 reps - 30 sec hold - Standing Bilateral Gastroc Stretch with Step  - 1 x daily - 7 x weekly - 1 sets - 1 reps - 20-30 hold   Access Code: [facility] URL: [email or web address] Date: 04/17/2022 Prepared by: Maebelle Munroe  Exercises - Sidelying Hip Abduction  - 1 x daily - 7 x weekly - 3 sets - 10 reps - Standing Heel Raise with Support  - 1 x daily - 7 x weekly - 3 sets - 10 reps - Single Leg Heel Raise  - 1 x daily - 7 x weekly - 3 sets - 10 reps - Heel Toe Raises with Counter Support  - 1 x daily - 7 x weekly - 3  sets - 10 reps   PATIENT EDUCATION: 05-14-22 Education details: added SLS to HEP  Person educated: Patient and Child(ren) Education method: Explanation, Demonstration, and Handouts Education comprehension: verbalized understanding and returned demonstration  HOME EXERCISE PROGRAM: Medbridge HEP  [email or web address]   GOALS: Goals reviewed with patient? Yes  SHORT TERM GOALS: Same as LTG's   LONG TERM GOALS: Target date: 05-15-22  Perform 5x sit to stand transfers in </= 15 secs to demo increased LE strength and improved balance upon initial standing.  Baseline: 19.5 secs (4 reps); 17.75; 05-12-22  12.60 secs Goal status: Goal met 05-12-22  2.  Improve TUG score to </= 12.5 secs to reduce fall risk and demo improved functional mobility. Baseline:  13.5 secs with no device;  9.94 secs Goal status: Goal met 05-12-22  3.  Increase gait speed to >/= 3.2 ft/sec without device for increased gait efficiency. Baseline: 11.78 secs = 2.78 ft/sec no device;   05-12-22:  10.88, 10.31 = 3.18 ft/sec without device Goal status: Goal met 05-12-22  4.  Amb.  500' on flat, even surface without LOB and without Lt foot catching for improved safety with community ambulation/accessibility. Baseline:  Goal status: Goal met - 38' with no device   5.  Independent in HEP for LLE strengthening and balance. Baseline:  Goal status: Goal met 05-12-22  UPDATED LTG's:  TARGET DATE 06-19-22 (extended 1 week due to primary PT on vacation)              1.  Perform 5x sit to stand transfers in </= 10 secs to demo increased LE strength and improved balance upon initial standing.  Baseline: 19.5 secs (4 reps); 17.75; 05-12-22  12.60 secs Goal status: UPDATED GOAL  2.  Increase gait speed to >/= 3.5 ft/sec without device for increased gait efficiency. Baseline: 11.78 secs = 2.78  ft/sec no device;   05-12-22:  10.88, 10.31 = 3.18 ft/sec without device Goal status: UPDATED GOAL  3.  Amb.  700' (6 laps)  on flat, even surface without LOB  and without Lt foot catching for improved safety with community ambulation/accessibility.  Baseline: 6' with no device with cues to increase initial heel contact to decrease shuffling   Goal status:  UPDATED GOAL  4.  Transfer floor to stand with UE support on object with supervision.  Baseline:  TBA  Goal status:  NEW  5.  Independent in updated HEP for balance and strengthening exercises.              Baseline:  TBA             Goal status:  NEW  ASSESSMENT:  CLINICAL IMPRESSION: PT session focused on LLE strengthening and balance training on compliant surfaces for increased vestibular input and also for improved SLS on each leg; pt has more difficulty with LLE SLS than with RLE SLS.  Pt using SPC for assistance with ambulation but did not use during session.  Pt continues to report noncompliance with HEP.  Cont with POC.     OBJECTIVE IMPAIRMENTS: Abnormal gait, decreased activity tolerance, decreased balance, decreased cognition, and decreased strength.   ACTIVITY LIMITATIONS: bending, squatting, stairs, transfers, and locomotion level  PARTICIPATION LIMITATIONS: cleaning, laundry, driving, shopping, community activity, and yard work  PERSONAL FACTORS: Fitness, Past/current experiences, and 1 comorbidity: carotid stenosis (dizziness)  are also affecting patient's functional outcome.   REHAB POTENTIAL: Good  CLINICAL DECISION MAKING: Stable/uncomplicated  EVALUATION COMPLEXITY: Low  PLAN:  PT FREQUENCY: 2x/week  PT DURATION: 4 weeks  PLANNED INTERVENTIONS: Therapeutic exercises, Therapeutic activity, Neuromuscular re-education, Balance training, Gait training, Patient/Family education, Self Care, Stair training, and DME instructions  PLAN FOR NEXT SESSION: Check LTG's and renew:  Cont. Balance, strengthening and gait; SLS on each leg   Romona Murdy, Donavan Burnet, PT 06/17/2022, 6:17 PM

## 2022-06-17 ENCOUNTER — Encounter: Payer: Self-pay | Admitting: Physical Therapy

## 2022-06-18 ENCOUNTER — Emergency Department (HOSPITAL_COMMUNITY): Payer: No Typology Code available for payment source

## 2022-06-18 ENCOUNTER — Inpatient Hospital Stay (HOSPITAL_COMMUNITY)
Admission: EM | Admit: 2022-06-18 | Discharge: 2022-06-21 | DRG: 291 | Disposition: A | Discharge status: Home / Self Care | Payer: No Typology Code available for payment source | Attending: Internal Medicine | Admitting: Internal Medicine

## 2022-06-18 DIAGNOSIS — K219 Gastro-esophageal reflux disease without esophagitis: Secondary | ICD-10-CM | POA: Diagnosis present

## 2022-06-18 DIAGNOSIS — Z6838 Body mass index (BMI) 38.0-38.9, adult: Secondary | ICD-10-CM

## 2022-06-18 DIAGNOSIS — I160 Hypertensive urgency: Secondary | ICD-10-CM | POA: Diagnosis present

## 2022-06-18 DIAGNOSIS — I130 Hypertensive heart and chronic kidney disease with heart failure and stage 1 through stage 4 chronic kidney disease, or unspecified chronic kidney disease: Principal | ICD-10-CM | POA: Diagnosis present

## 2022-06-18 DIAGNOSIS — Z91148 Patient's other noncompliance with medication regimen for other reason: Secondary | ICD-10-CM

## 2022-06-18 DIAGNOSIS — E669 Obesity, unspecified: Secondary | ICD-10-CM | POA: Diagnosis present

## 2022-06-18 DIAGNOSIS — Z951 Presence of aortocoronary bypass graft: Secondary | ICD-10-CM

## 2022-06-18 DIAGNOSIS — I255 Ischemic cardiomyopathy: Secondary | ICD-10-CM | POA: Diagnosis present

## 2022-06-18 DIAGNOSIS — N179 Acute kidney failure, unspecified: Secondary | ICD-10-CM | POA: Diagnosis present

## 2022-06-18 DIAGNOSIS — I4891 Unspecified atrial fibrillation: Secondary | ICD-10-CM | POA: Diagnosis not present

## 2022-06-18 DIAGNOSIS — Z79899 Other long term (current) drug therapy: Secondary | ICD-10-CM

## 2022-06-18 DIAGNOSIS — E785 Hyperlipidemia, unspecified: Secondary | ICD-10-CM | POA: Diagnosis present

## 2022-06-18 DIAGNOSIS — I252 Old myocardial infarction: Secondary | ICD-10-CM

## 2022-06-18 DIAGNOSIS — N182 Chronic kidney disease, stage 2 (mild): Secondary | ICD-10-CM | POA: Diagnosis present

## 2022-06-18 DIAGNOSIS — I2510 Atherosclerotic heart disease of native coronary artery without angina pectoris: Secondary | ICD-10-CM | POA: Diagnosis present

## 2022-06-18 DIAGNOSIS — N1831 Chronic kidney disease, stage 3a: Secondary | ICD-10-CM | POA: Diagnosis present

## 2022-06-18 DIAGNOSIS — I509 Heart failure, unspecified: Secondary | ICD-10-CM | POA: Diagnosis not present

## 2022-06-18 DIAGNOSIS — I5023 Acute on chronic systolic (congestive) heart failure: Secondary | ICD-10-CM | POA: Diagnosis present

## 2022-06-18 DIAGNOSIS — R7303 Prediabetes: Secondary | ICD-10-CM | POA: Diagnosis present

## 2022-06-18 DIAGNOSIS — F32A Depression, unspecified: Secondary | ICD-10-CM | POA: Diagnosis present

## 2022-06-18 DIAGNOSIS — N400 Enlarged prostate without lower urinary tract symptoms: Secondary | ICD-10-CM | POA: Diagnosis present

## 2022-06-18 DIAGNOSIS — Z7901 Long term (current) use of anticoagulants: Secondary | ICD-10-CM

## 2022-06-18 DIAGNOSIS — E039 Hypothyroidism, unspecified: Secondary | ICD-10-CM | POA: Diagnosis present

## 2022-06-18 DIAGNOSIS — R0602 Shortness of breath: Secondary | ICD-10-CM | POA: Diagnosis not present

## 2022-06-18 DIAGNOSIS — I480 Paroxysmal atrial fibrillation: Secondary | ICD-10-CM | POA: Diagnosis present

## 2022-06-18 DIAGNOSIS — I10 Essential (primary) hypertension: Secondary | ICD-10-CM | POA: Diagnosis present

## 2022-06-18 DIAGNOSIS — D72829 Elevated white blood cell count, unspecified: Secondary | ICD-10-CM | POA: Diagnosis present

## 2022-06-18 DIAGNOSIS — I6521 Occlusion and stenosis of right carotid artery: Secondary | ICD-10-CM | POA: Diagnosis present

## 2022-06-18 LAB — HEPATIC FUNCTION PANEL
ALT: 22 U/L (ref 0–44)
AST: 22 U/L (ref 15–41)
Albumin: 3.9 g/dL (ref 3.5–5.0)
Alkaline Phosphatase: 85 U/L (ref 38–126)
Bilirubin, Direct: 0.2 mg/dL (ref 0.0–0.2)
Indirect Bilirubin: 0.9 mg/dL (ref 0.3–0.9)
Total Bilirubin: 1.1 mg/dL (ref 0.3–1.2)
Total Protein: 7.1 g/dL (ref 6.5–8.1)

## 2022-06-18 LAB — BASIC METABOLIC PANEL
Anion gap: 7 (ref 5–15)
BUN: 17 mg/dL (ref 8–23)
CO2: 25 mmol/L (ref 22–32)
Calcium: 8.9 mg/dL (ref 8.9–10.3)
Chloride: 105 mmol/L (ref 98–111)
Creatinine, Ser: 1.49 mg/dL — ABNORMAL HIGH (ref 0.61–1.24)
GFR, Estimated: 49 mL/min — ABNORMAL LOW (ref >60)
Glucose, Bld: 125 mg/dL — ABNORMAL HIGH (ref 70–99)
Potassium: 4.3 mmol/L (ref 3.5–5.1)
Sodium: 137 mmol/L (ref 135–145)

## 2022-06-18 LAB — CBC
HCT: 41.7 % (ref 39.0–52.0)
Hemoglobin: 14.7 g/dL (ref 13.0–17.0)
MCH: 31.7 pg (ref 26.0–34.0)
MCHC: 35.3 g/dL (ref 30.0–36.0)
MCV: 90.1 fL (ref 80.0–100.0)
Platelets: 175 10*3/uL (ref 150–400)
RBC: 4.63 MIL/uL (ref 4.22–5.81)
RDW: 12.7 % (ref 11.5–15.5)
WBC: 9.8 10*3/uL (ref 4.0–10.5)
nRBC: 0 % (ref 0.0–0.2)

## 2022-06-18 LAB — BRAIN NATRIURETIC PEPTIDE: B Natriuretic Peptide: 342.1 pg/mL — ABNORMAL HIGH (ref 0.0–100.0)

## 2022-06-18 LAB — TROPONIN I (HIGH SENSITIVITY): Troponin I (High Sensitivity): 28 ng/L — ABNORMAL HIGH (ref <18)

## 2022-06-18 MED ORDER — LACTATED RINGERS IV BOLUS: 1000.0000 mL | Freq: Once | INTRAVENOUS | Status: DC

## 2022-06-18 NOTE — ED Provider Notes (Signed)
[place] EMERGENCY DEPARTMENT AT [facility] Provider Note   CSN: [id number] Arrival date & time: 06/18/22  1455     History  Chief Complaint  Patient presents with   Atrial Fibrillation    William Matthews is a 72 y.o. male. 72 year old male with history of paroxysmal A-fib on Eliquis, CAD status post CABG and stent, CHF HFrEF, CKD 3, hypertension, hyperlipidemia, hypothyroidism, BPH, prior CVA presenting for chest pain.  Patient was at the [place] getting an echocardiogram today when he developed left-sided chest pain.  He had chest pain both before the echocardiogram and while he was having echocardiogram.  It was pressure-like.  Pain has improved but still mild.  He has felt short of breath for about a week.  No fevers or chills.  No history of DVT or PE as far as he is aware.  He is pretty good about taking his Eliquis but may have missed some doses recently.  He reports he had A-fib about 2 years ago requiring cardioversion but is not had any since.  Does not have the palpitations.  Other than worsening chest pain today no new symptoms today.  No change in his medications.  Chart review appears to have last heart cath July 2021.   Atrial Fibrillation Associated symptoms include chest pain and shortness of breath.       Home Medications Prior to Admission medications   Medication Sig Start Date End Date Taking? Authorizing Provider  amiodarone (PACERONE) 200 MG tablet Take 100 mg by mouth daily. 06/18/20  Yes [provider]  apixaban (ELIQUIS) 2.5 MG TABS tablet Take 5 mg by mouth at bedtime.   Yes [provider]  atorvastatin (LIPITOR) 20 MG tablet Take 1 tablet (20 mg total) by mouth at bedtime. Patient taking differently: Take 40 mg by mouth at bedtime. 03/28/22  Yes Osvaldo Shipper, MD  carvedilol (COREG) 6.25 MG tablet Take 0.5 tablets by mouth daily. 06/03/22  Yes [provider]  Cholecalciferol 250 MCG (10000 UT) TABS Take 1,000 Units by  mouth daily. 06/03/22  Yes [provider]  DULoxetine (CYMBALTA) 20 MG capsule Take 20 mg by mouth daily. 04/22/22  Yes [provider]  esomeprazole (NEXIUM) 40 MG capsule Take 40 mg by mouth daily. 04/12/20  Yes [provider]  furosemide (LASIX) 40 MG tablet Take 40 mg by mouth daily. 07/10/20  Yes [provider]  lidocaine (LIDODERM) 5 % Place 1 patch onto the skin daily. Remove & Discard patch within 12 hours or as directed by MD 05/25/22  Yes Raspet, Denny Peon K, PA-C  mirabegron ER (MYRBETRIQ) 50 MG TB24 tablet Take 50 mg by mouth at bedtime. 06/03/22  Yes [provider]  mupirocin ointment (BACTROBAN) 2 % Apply 1 Application topically 2 (two) times daily. 05/25/22  Yes Raspet, Denny Peon K, PA-C  tamsulosin (FLOMAX) 0.4 MG CAPS capsule Take 0.4 mg by mouth at bedtime. 04/13/22  Yes [provider]  albuterol (VENTOLIN HFA) 108 (90 Base) MCG/ACT inhaler SMARTSIG:1-2 Puff(s) By Mouth Every 6 Hours PRN Patient not taking: Reported on 06/19/2022    [provider]      Allergies    Ciprofloxacin hcl and Peanut-containing drug products    Review of Systems   Review of Systems  Respiratory:  Positive for shortness of breath.   Cardiovascular:  Positive for chest pain. Negative for palpitations and leg swelling.  All other systems reviewed and are negative.   Physical Exam Updated Vital Signs BP Marland Kitchen)  186/96 (BP Location: Right Arm)   Pulse 80   Temp 97.9 F (36.6 C) (Oral)   Resp 18   Ht 5\' 10"  (1.778 m)   Wt 119.3 kg   SpO2 96%   BMI 37.72 kg/m  Physical Exam Vitals and nursing note reviewed.  Constitutional:      General: He is not in acute distress.    Appearance: He is well-developed.  HENT:     Head: Normocephalic and atraumatic.     Nose: Nose normal.     Mouth/Throat:     Mouth: Mucous membranes are moist.     Pharynx: Oropharynx is clear.  Eyes:     Conjunctiva/sclera: Conjunctivae normal.  Cardiovascular:     Rate  and Rhythm: Normal rate and regular rhythm.     Heart sounds: No murmur heard. Pulmonary:     Effort: Pulmonary effort is normal. No respiratory distress.     Breath sounds: Normal breath sounds.  Abdominal:     Palpations: Abdomen is soft.     Tenderness: There is no abdominal tenderness.  Musculoskeletal:        General: No swelling.     Cervical back: Neck supple.     Right lower leg: Edema present.     Left lower leg: Edema present.  Skin:    General: Skin is warm and dry.     Capillary Refill: Capillary refill takes less than 2 seconds.  Neurological:     General: No focal deficit present.     Mental Status: He is alert and oriented to person, place, and time. Mental status is at baseline.  Psychiatric:        Mood and Affect: Mood normal.     ED Results / Procedures / Treatments   Labs (all labs ordered are listed, but only abnormal results are displayed) Labs Reviewed  BASIC METABOLIC PANEL - Abnormal; Notable for the following components:      Result Value   Glucose, Bld 125 (*)    Creatinine, Ser 1.49 (*)    GFR, Estimated 49 (*)    All other components within normal limits  BRAIN NATRIURETIC PEPTIDE - Abnormal; Notable for the following components:   B Natriuretic Peptide 342.1 (*)    All other components within normal limits  TSH - Abnormal; Notable for the following components:   TSH 5.856 (*)    All other components within normal limits  BASIC METABOLIC PANEL - Abnormal; Notable for the following components:   Glucose, Bld 122 (*)    Creatinine, Ser 1.42 (*)    Calcium 8.7 (*)    GFR, Estimated 52 (*)    All other components within normal limits  TROPONIN I (HIGH SENSITIVITY) - Abnormal; Notable for the following components:   Troponin I (High Sensitivity) 28 (*)    All other components within normal limits  TROPONIN I (HIGH SENSITIVITY) - Abnormal; Notable for the following components:   Troponin I (High Sensitivity) 29 (*)    All other components  within normal limits  CBC  HEPATIC FUNCTION PANEL  CBC WITH DIFFERENTIAL/PLATELET    EKG EKG Interpretation  Date/Time:  Thursday Jun 18 2022 15:04:25 EDT Ventricular Rate:  67 PR Interval:    QRS Duration: 112 QT Interval:  444 QTC Calculation: 469 R Axis:   68 Text Interpretation: Atrial fibrillation Incomplete right bundle branch block Inferior infarct , age undetermined Cannot rule out Anterior infarct , age undetermined Abnormal ECG When compared with ECG of 26-Mar-2022  14:17, PREVIOUS ECG IS PRESENT Confirmed by Lorre Nick ([id number]) on 06/18/2022 9:12:10 PM  Radiology DG Chest 1 View  Result Date: 06/18/2022 CLINICAL DATA:  Atrial fibrillation, shortness of breath EXAM: CHEST  1 VIEW COMPARISON:  Portable exam 1522 hours compared to 05/25/2022 FINDINGS: Normal heart size post median sternotomy and CABG. Mediastinal contours and pulmonary vascularity normal. Atherosclerotic calcification aorta. Lungs clear. No pulmonary infiltrate, pleural effusion, or pneumothorax. Osseous structures unremarkable. IMPRESSION: No acute abnormalities. Aortic Atherosclerosis (ICD10-I70.0). Electronically Signed   By: Ulyses Southward M.D.   On: 06/18/2022 15:37    Procedures Procedures    Medications Ordered in ED Medications  amiodarone (PACERONE) tablet 100 mg (100 mg Oral Given 06/19/22 0839)  atorvastatin (LIPITOR) tablet 40 mg (has no administration in time range)  DULoxetine (CYMBALTA) DR capsule 20 mg (20 mg Oral Given 06/19/22 0840)  pantoprazole (PROTONIX) EC tablet 80 mg (80 mg Oral Given 06/19/22 1149)  mirabegron ER (MYRBETRIQ) tablet 50 mg (has no administration in time range)  tamsulosin (FLOMAX) capsule 0.4 mg (has no administration in time range)  apixaban (ELIQUIS) tablet 5 mg (5 mg Oral Given 06/19/22 0839)  furosemide (LASIX) injection 40 mg (has no administration in time range)  acetaminophen (TYLENOL) tablet 650 mg (has no administration in time range)    Or  acetaminophen  (TYLENOL) suppository 650 mg (has no administration in time range)  carvedilol (COREG) tablet 12.5 mg (has no administration in time range)  hydrALAZINE (APRESOLINE) tablet 25 mg (25 mg Oral Given 06/19/22 1349)  dapagliflozin propanediol (FARXIGA) tablet 10 mg (10 mg Oral Given 06/19/22 1149)  sacubitril-valsartan (ENTRESTO) 24-26 mg per tablet (1 tablet Oral Given 06/19/22 1349)  potassium chloride (KLOR-CON) packet 40 mEq (has no administration in time range)  furosemide (LASIX) injection 40 mg (40 mg Intravenous Given 06/19/22 0036)  hydrALAZINE (APRESOLINE) injection 10 mg (10 mg Intravenous Given 06/19/22 0208)    ED Course/ Medical Decision Making/ A&P Clinical Course as of 06/19/22 1453  Thu Jun 18, 2022  2143 EKG A-fib versus a flutter, rate 67, intervals are unremarkable, inferior Q waves, no acute ST changes. [JD]    Clinical Course User Index [JD] Fulton Reek, MD                             Medical Decision Making Amount and/or Complexity of Data Reviewed Labs: ordered.  Risk Prescription drug management. Decision regarding hospitalization.   72 year old with history as above presenting for shortness of breath, chest pain.  Vital signs reviewed notable for hypertension.  On exam he appears somewhat volume up, he is short of breath but not hypoxic.  His EKG shows A-fib versus a flutter, rate controlled.  Chest pain earlier was reproducible on exam during his echocardiogram, I have low concern for ACS will trend troponin.  No ischemic changes on EKG.  I suspect his shortness of breath may be related to increased volume, also possible symptomatic A-fib/a flutter.  Given unclear onset, not a candidate for cardioversion especially with inconsistent Eliquis use.  Chest show reviewed, unremarkable no signs of pulmonary edema, pneumonia, mediastinal widening.  I do not see any signs of acute aortic pathology.  His lower extremity edema is symmetric, no pleuritic pain or hypoxia low  concern for PE at this time.  Initial troponin 28, will trend.  His BNP is elevated, he appears somewhat volume up we will give IV Lasix.  BMP with baseline renal function.  CBC, LFTs are unremarkable.  Chest x-ray is unremarkable.  Given his subacute shortness of breath and volume status, I am concerned he may have symptomatic atrial fibrillation/atrial flutter.  Cardiology was consulted for further evaluation.  Handoff was given to oncoming provider at midnight with plan to follow-up cardiology recommendations and repeat troponin.        Final Clinical Impression(s) / ED Diagnoses Final diagnoses:  Acute congestive heart failure, unspecified heart failure type [facility])  Atrial fibrillation, unspecified type [facility])    Rx / DC Orders ED Discharge Orders     None         Fulton Reek, MD 06/19/22 1453    Lorre Nick, MD 06/19/22 1627

## 2022-06-18 NOTE — ED Provider Triage Note (Signed)
Emergency Medicine Provider Triage Evaluation Note  William Matthews , a 72 y.o. male  was evaluated in triage.  Pt complains of chest pain and abnormal heart rhythm. Had echocardiogram today at the [place] and noted to be in afib on echo and EKG. Hx of CAD x/p CABG 1994, NSTEMI 2016, PCI 2017. EF 40% on echo today. Hx of prior afib, required electrical cardioversion 2 weeks ago. Chest pain has resolved, still complaining of some SOB.   Review of Systems  Positive: Chest pain (resolved), SOB Negative:   Physical Exam  BP (!) 162/83 (BP Location: Left Arm)   Pulse 75   Temp 98.4 F (36.9 C) (Oral)   Resp 18   SpO2 94%  Gen:   Awake, no distress   Resp:  Normal effort  MSK:   Moves extremities without difficulty  Other:    Medical Decision Making  Medically screening exam initiated at 3:10 PM.  Appropriate orders placed.  [name] was informed that the remainder of the evaluation will be completed by another provider, this initial triage assessment does not replace that evaluation, and the importance of remaining in the ED until their evaluation is complete.  Upon chart review, note from [place] this AM: "During ultrasound veteran began experiencing 9/10 chest pain with radiation to his left arm. Sonographer alerted Consulting civil engineer. Veteran also hypertensive 174/103. Upon this RN's assessment veteran's pain was down to a 6/10. EKG was done, noting afib. Eber Jones was alerted and then assessed and spoke with veteran. It was strongly advised that veteran be transferred to an emergency for further evaluation."  EKG in triage shows rate controlled afib   [name] T, PA-C 06/18/22 1512

## 2022-06-18 NOTE — ED Triage Notes (Signed)
Pt referred to the ED by [facility] cardiologist for being in A-fib. Pt states he was evaluated ten days ago and was not, but on evaluation today he was in afib. Pt denies CP, but endorses SOB.

## 2022-06-18 NOTE — ED Provider Notes (Signed)
I saw and evaluated the patient, reviewed the resident's note and I agree with the findings and plan.  EKG Interpretation  Date/Time:  Thursday Jun 18 2022 15:04:25 EDT Ventricular Rate:  67 PR Interval:    QRS Duration: 112 QT Interval:  444 QTC Calculation: 469 R Axis:   68 Text Interpretation: Atrial fibrillation Incomplete right bundle branch block Inferior infarct , age undetermined Cannot rule out Anterior infarct , age undetermined Abnormal ECG When compared with ECG of 26-Mar-2022 14:17, PREVIOUS ECG IS PRESENT Confirmed by Lorre Nick ([id number]) on 06/18/2022 9:59:69 PM   73 year old male presents with constant left-sided chest pain x 2 days.  Pain was worse when he was having echo done today when the probe was placed against his chest.  He has a history of paroxysmal A-fib and is on Eliquis and has been compliant the last 7 days.  He is unsure of when he went back into A-fib.  His EKG shows A-fib with a controlled rate of 54.  Will cycle troponins and reassess   Lorre Nick, MD 06/18/22 2201

## 2022-06-19 ENCOUNTER — Encounter (HOSPITAL_COMMUNITY): Payer: Self-pay | Admitting: Internal Medicine

## 2022-06-19 ENCOUNTER — Other Ambulatory Visit: Payer: Self-pay

## 2022-06-19 ENCOUNTER — Other Ambulatory Visit (HOSPITAL_COMMUNITY): Payer: Self-pay

## 2022-06-19 DIAGNOSIS — I10 Essential (primary) hypertension: Secondary | ICD-10-CM | POA: Diagnosis not present

## 2022-06-19 DIAGNOSIS — F32A Depression, unspecified: Secondary | ICD-10-CM | POA: Diagnosis present

## 2022-06-19 DIAGNOSIS — I25118 Atherosclerotic heart disease of native coronary artery with other forms of angina pectoris: Secondary | ICD-10-CM

## 2022-06-19 DIAGNOSIS — I5023 Acute on chronic systolic (congestive) heart failure: Secondary | ICD-10-CM | POA: Diagnosis not present

## 2022-06-19 DIAGNOSIS — E039 Hypothyroidism, unspecified: Secondary | ICD-10-CM | POA: Diagnosis present

## 2022-06-19 DIAGNOSIS — I110 Hypertensive heart disease with heart failure: Secondary | ICD-10-CM | POA: Diagnosis not present

## 2022-06-19 DIAGNOSIS — I509 Heart failure, unspecified: Secondary | ICD-10-CM

## 2022-06-19 DIAGNOSIS — I130 Hypertensive heart and chronic kidney disease with heart failure and stage 1 through stage 4 chronic kidney disease, or unspecified chronic kidney disease: Secondary | ICD-10-CM | POA: Diagnosis present

## 2022-06-19 DIAGNOSIS — Z7901 Long term (current) use of anticoagulants: Secondary | ICD-10-CM | POA: Diagnosis not present

## 2022-06-19 DIAGNOSIS — I480 Paroxysmal atrial fibrillation: Secondary | ICD-10-CM | POA: Diagnosis not present

## 2022-06-19 DIAGNOSIS — N400 Enlarged prostate without lower urinary tract symptoms: Secondary | ICD-10-CM | POA: Diagnosis present

## 2022-06-19 DIAGNOSIS — E785 Hyperlipidemia, unspecified: Secondary | ICD-10-CM | POA: Diagnosis present

## 2022-06-19 DIAGNOSIS — N1831 Chronic kidney disease, stage 3a: Secondary | ICD-10-CM | POA: Diagnosis present

## 2022-06-19 DIAGNOSIS — I6521 Occlusion and stenosis of right carotid artery: Secondary | ICD-10-CM | POA: Diagnosis present

## 2022-06-19 DIAGNOSIS — Z91148 Patient's other noncompliance with medication regimen for other reason: Secondary | ICD-10-CM | POA: Diagnosis not present

## 2022-06-19 DIAGNOSIS — Z951 Presence of aortocoronary bypass graft: Secondary | ICD-10-CM | POA: Diagnosis not present

## 2022-06-19 DIAGNOSIS — E669 Obesity, unspecified: Secondary | ICD-10-CM | POA: Diagnosis present

## 2022-06-19 DIAGNOSIS — K219 Gastro-esophageal reflux disease without esophagitis: Secondary | ICD-10-CM | POA: Diagnosis present

## 2022-06-19 DIAGNOSIS — I252 Old myocardial infarction: Secondary | ICD-10-CM | POA: Diagnosis not present

## 2022-06-19 DIAGNOSIS — R7303 Prediabetes: Secondary | ICD-10-CM | POA: Diagnosis present

## 2022-06-19 DIAGNOSIS — D72829 Elevated white blood cell count, unspecified: Secondary | ICD-10-CM | POA: Diagnosis present

## 2022-06-19 DIAGNOSIS — N179 Acute kidney failure, unspecified: Secondary | ICD-10-CM | POA: Diagnosis present

## 2022-06-19 DIAGNOSIS — I255 Ischemic cardiomyopathy: Secondary | ICD-10-CM | POA: Diagnosis present

## 2022-06-19 DIAGNOSIS — I4891 Unspecified atrial fibrillation: Secondary | ICD-10-CM

## 2022-06-19 DIAGNOSIS — Z6838 Body mass index (BMI) 38.0-38.9, adult: Secondary | ICD-10-CM | POA: Diagnosis not present

## 2022-06-19 DIAGNOSIS — Z79899 Other long term (current) drug therapy: Secondary | ICD-10-CM | POA: Diagnosis not present

## 2022-06-19 DIAGNOSIS — I2510 Atherosclerotic heart disease of native coronary artery without angina pectoris: Secondary | ICD-10-CM | POA: Diagnosis present

## 2022-06-19 DIAGNOSIS — I160 Hypertensive urgency: Secondary | ICD-10-CM | POA: Diagnosis present

## 2022-06-19 LAB — CBC WITH DIFFERENTIAL/PLATELET
Abs Immature Granulocytes: 0.04 10*3/uL (ref 0.00–0.07)
Basophils Absolute: 0 10*3/uL (ref 0.0–0.1)
Basophils Relative: 0 %
Eosinophils Absolute: 0.3 10*3/uL (ref 0.0–0.5)
Eosinophils Relative: 3 %
HCT: 42.9 % (ref 39.0–52.0)
Hemoglobin: 14.4 g/dL (ref 13.0–17.0)
Immature Granulocytes: 0 %
Lymphocytes Relative: 21 %
Lymphs Abs: 2.2 10*3/uL (ref 0.7–4.0)
MCH: 30.7 pg (ref 26.0–34.0)
MCHC: 33.6 g/dL (ref 30.0–36.0)
MCV: 91.5 fL (ref 80.0–100.0)
Monocytes Absolute: 0.8 10*3/uL (ref 0.1–1.0)
Monocytes Relative: 8 %
Neutro Abs: 6.8 10*3/uL (ref 1.7–7.7)
Neutrophils Relative %: 68 %
Platelets: 186 10*3/uL (ref 150–400)
RBC: 4.69 MIL/uL (ref 4.22–5.81)
RDW: 13 % (ref 11.5–15.5)
WBC: 10.2 10*3/uL (ref 4.0–10.5)
nRBC: 0 % (ref 0.0–0.2)

## 2022-06-19 LAB — BASIC METABOLIC PANEL
Anion gap: 11 (ref 5–15)
BUN: 16 mg/dL (ref 8–23)
CO2: 25 mmol/L (ref 22–32)
Calcium: 8.7 mg/dL — ABNORMAL LOW (ref 8.9–10.3)
Chloride: 100 mmol/L (ref 98–111)
Creatinine, Ser: 1.42 mg/dL — ABNORMAL HIGH (ref 0.61–1.24)
GFR, Estimated: 52 mL/min — ABNORMAL LOW (ref >60)
Glucose, Bld: 122 mg/dL — ABNORMAL HIGH (ref 70–99)
Potassium: 3.5 mmol/L (ref 3.5–5.1)
Sodium: 136 mmol/L (ref 135–145)

## 2022-06-19 LAB — TSH: TSH: 5.856 u[IU]/mL — ABNORMAL HIGH (ref 0.350–4.500)

## 2022-06-19 LAB — TROPONIN I (HIGH SENSITIVITY): Troponin I (High Sensitivity): 29 ng/L — ABNORMAL HIGH (ref <18)

## 2022-06-19 MED ORDER — ACETAMINOPHEN 325 MG PO TABS: 650.0000 mg | ORAL_TABLET | Freq: Four times a day (QID) | ORAL | Status: DC | PRN

## 2022-06-19 MED ORDER — ACETAMINOPHEN 650 MG RE SUPP: 650.0000 mg | Freq: Four times a day (QID) | RECTAL | Status: DC | PRN

## 2022-06-19 MED ORDER — MIRABEGRON ER 50 MG PO TB24
50.0000 mg | ORAL_TABLET | Freq: Every day | ORAL | Status: DC
  Administered 2022-06-19 – 2022-06-20 (×2): 50 mg via ORAL
  Filled 2022-06-19 (×3): qty 1

## 2022-06-19 MED ORDER — PANTOPRAZOLE SODIUM 40 MG PO TBEC
80.0000 mg | DELAYED_RELEASE_TABLET | Freq: Every day | ORAL | Status: DC
  Administered 2022-06-19 – 2022-06-21 (×3): 80 mg via ORAL
  Filled 2022-06-19 (×3): qty 2

## 2022-06-19 MED ORDER — CARVEDILOL 3.125 MG PO TABS
3.1250 mg | ORAL_TABLET | Freq: Every day | ORAL | Status: DC
  Administered 2022-06-19: 3.125 mg via ORAL
  Filled 2022-06-19: qty 1

## 2022-06-19 MED ORDER — FUROSEMIDE 10 MG/ML IJ SOLN
40.0000 mg | Freq: Once | INTRAMUSCULAR | Status: AC
  Administered 2022-06-19: 40 mg via INTRAVENOUS
  Filled 2022-06-19: qty 4

## 2022-06-19 MED ORDER — HYDRALAZINE HCL 20 MG/ML IJ SOLN
10.0000 mg | Freq: Once | INTRAMUSCULAR | Status: AC
  Administered 2022-06-19: 10 mg via INTRAVENOUS
  Filled 2022-06-19: qty 1

## 2022-06-19 MED ORDER — TAMSULOSIN HCL 0.4 MG PO CAPS
0.4000 mg | ORAL_CAPSULE | Freq: Every day | ORAL | Status: DC
  Administered 2022-06-19 – 2022-06-20 (×2): 0.4 mg via ORAL
  Filled 2022-06-19 (×2): qty 1

## 2022-06-19 MED ORDER — DAPAGLIFLOZIN PROPANEDIOL 10 MG PO TABS
10.0000 mg | ORAL_TABLET | Freq: Every day | ORAL | Status: DC
  Administered 2022-06-19 – 2022-06-21 (×3): 10 mg via ORAL
  Filled 2022-06-19 (×3): qty 1

## 2022-06-19 MED ORDER — SACUBITRIL-VALSARTAN 24-26 MG PO TABS
1.0000 | ORAL_TABLET | Freq: Two times a day (BID) | ORAL | Status: DC
  Administered 2022-06-19 – 2022-06-21 (×5): 1 via ORAL
  Filled 2022-06-19 (×5): qty 1

## 2022-06-19 MED ORDER — HYDRALAZINE HCL 25 MG PO TABS
25.0000 mg | ORAL_TABLET | Freq: Three times a day (TID) | ORAL | Status: DC
  Administered 2022-06-19 – 2022-06-20 (×3): 25 mg via ORAL
  Filled 2022-06-19 (×3): qty 1

## 2022-06-19 MED ORDER — POTASSIUM CHLORIDE 20 MEQ PO PACK
40.0000 meq | PACK | Freq: Once | ORAL | Status: AC
  Administered 2022-06-19: 40 meq via ORAL
  Filled 2022-06-19: qty 2

## 2022-06-19 MED ORDER — APIXABAN 5 MG PO TABS
5.0000 mg | ORAL_TABLET | Freq: Two times a day (BID) | ORAL | Status: DC
  Administered 2022-06-19 – 2022-06-21 (×5): 5 mg via ORAL
  Filled 2022-06-19 (×5): qty 1

## 2022-06-19 MED ORDER — AMIODARONE HCL 100 MG PO TABS
100.0000 mg | ORAL_TABLET | Freq: Every day | ORAL | Status: DC
  Administered 2022-06-19 – 2022-06-21 (×3): 100 mg via ORAL
  Filled 2022-06-19 (×3): qty 1

## 2022-06-19 MED ORDER — CARVEDILOL 12.5 MG PO TABS
12.5000 mg | ORAL_TABLET | Freq: Every day | ORAL | Status: DC
  Administered 2022-06-20: 12.5 mg via ORAL
  Filled 2022-06-19: qty 1

## 2022-06-19 MED ORDER — FUROSEMIDE 10 MG/ML IJ SOLN
40.0000 mg | Freq: Two times a day (BID) | INTRAMUSCULAR | Status: DC
  Administered 2022-06-19 – 2022-06-20 (×2): 40 mg via INTRAVENOUS
  Filled 2022-06-19 (×2): qty 4

## 2022-06-19 MED ORDER — ATORVASTATIN CALCIUM 40 MG PO TABS
40.0000 mg | ORAL_TABLET | Freq: Every day | ORAL | Status: DC
  Administered 2022-06-19 – 2022-06-20 (×2): 40 mg via ORAL
  Filled 2022-06-19 (×2): qty 1

## 2022-06-19 MED ORDER — DULOXETINE HCL 20 MG PO CPEP
20.0000 mg | ORAL_CAPSULE | Freq: Every day | ORAL | Status: DC
  Administered 2022-06-19 – 2022-06-21 (×3): 20 mg via ORAL
  Filled 2022-06-19 (×3): qty 1

## 2022-06-19 NOTE — Evaluation (Signed)
Physical Therapy Evaluation Patient Details Name: William Matthews MRN: [id number] DOB: October 30, 1950 Today's Date: 06/19/2022  History of Present Illness  Pt is a 72 year old male admitted on 06/18/22 for SOB. Past history of systolic CHF with recovered EF, CAD s/p CABG and stenting, hypertension, chronic kidney disease stage III recently admitted in February 2024 for possible stroke had followed up with the [facility] yesterday and was found to be in A-fib.  Clinical Impression  Pt presents with admitting diagnosis above. Today pt was able to ambulate in hallway with no AD Min A and navigate stairs at min guard level. Pt demonstrated a rather unsteady gait pattern and would benefit from ambulation with DME next session. Opted for HHA today due to lack of properly sized RW in room. RN made aware. Recommend pt resume OPPT upon DC. PT will continue to follow.       Recommendations for follow up therapy are one component of a multi-disciplinary discharge planning process, led by the attending physician.  Recommendations may be updated based on patient status, additional functional criteria and insurance authorization.  Follow Up Recommendations       Assistance Recommended at Discharge Intermittent Supervision/Assistance  Patient can return home with the following  A little help with walking and/or transfers;A little help with bathing/dressing/bathroom;Assistance with cooking/housework;Direct supervision/assist for medications management;Assist for transportation;Help with stairs or ramp for entrance    Equipment Recommendations None recommended by PT (PT has needed DME)  Recommendations for Other Services  OT consult    Functional Status Assessment Patient has had a recent decline in their functional status and demonstrates the ability to make significant improvements in function in a reasonable and predictable amount of time.     Precautions / Restrictions Precautions Precautions:  Fall Restrictions Weight Bearing Restrictions: No      Mobility  Bed Mobility Overal bed mobility: Needs Assistance Bed Mobility: Supine to Sit, Sit to Supine     Supine to sit: Min assist Sit to supine: Supervision   General bed mobility comments: Pt requested HHA for trunk elevation.    Transfers Overall transfer level: Needs assistance Equipment used: 1 person hand held assist Transfers: Sit to/from Stand Sit to Stand: Min assist           General transfer comment: Pt very unsteady on initial sit to stand pressing legs against bed for support.    Ambulation/Gait Ambulation/Gait assistance: Min assist Gait Distance (Feet): 250 Feet Assistive device: None, 1 person hand held assist Gait Pattern/deviations: Drifts right/left, Staggering right, Staggering left, Narrow base of support, Decreased stride length, Step-through pattern Gait velocity: decreased     General Gait Details: Pt with a rather unsteady gait pattern however no apparent LOB. Pt would likely benefit from DME next session.  Stairs Stairs: Yes Stairs assistance: Min guard Stair Management: Two rails, Alternating pattern, Forwards Number of Stairs: 10 General stair comments: no LOB noted  Wheelchair Mobility    Modified Rankin (Stroke Patients Only)       Balance Overall balance assessment: Needs assistance Sitting-balance support: Bilateral upper extremity supported, Feet supported Sitting balance-Leahy Scale: Fair Sitting balance - Comments: Pt initally with posterior lean. Postural control: Posterior lean Standing balance support: Single extremity supported, No upper extremity supported Standing balance-Leahy Scale: Poor Standing balance comment: Pt very wobbly but no apparent LOB. Would benefit from DME  Pertinent Vitals/Pain Pain Assessment Pain Assessment: No/denies pain    Home Living Family/patient expects to be discharged to:: Private  residence Living Arrangements: Children (Daughter and her husband) Available Help at Discharge: Family;Available 24 hours/day Type of Home: House Home Access: Stairs to enter Entrance Stairs-Rails: Can reach both Entrance Stairs-Number of Steps: 3   Home Layout: One level Home Equipment: Agricultural consultant (2 wheels);Cane - single point;Shower seat;Grab bars - toilet;Wheelchair - manual Additional Comments: no family present but pt reports information above.    Prior Function Prior Level of Function : Independent/Modified Independent;Driving;History of Falls (last six months) (3 falls. Pt reports mechanical falls after getting L leg caught on things. Pt reports that LLE drags after stroke in december.)             Mobility Comments: Pt reports using no AD around the house and [facility] in the community. ADLs Comments: Ind     Hand Dominance   Dominant Hand: Right    Extremity/Trunk Assessment   Upper Extremity Assessment Upper Extremity Assessment: Overall WFL for tasks assessed    Lower Extremity Assessment Lower Extremity Assessment: Overall WFL for tasks assessed    Cervical / Trunk Assessment Cervical / Trunk Assessment: Normal  Communication   Communication: No difficulties  Cognition Arousal/Alertness: Awake/alert Behavior During Therapy: Flat affect Overall Cognitive Status: Within Functional Limits for tasks assessed                                          General Comments General comments (skin integrity, edema, etc.): BP: 186/96 seated after ambulation. RN present and administering BP meds.    Exercises     Assessment/Plan    PT Assessment Patient needs continued PT services  PT Problem List Decreased strength;Decreased range of motion;Decreased activity tolerance;Decreased balance;Decreased mobility;Decreased coordination;Decreased knowledge of use of DME;Decreased safety awareness;Cardiopulmonary status limiting activity;Obesity       PT  Treatment Interventions DME instruction;Gait training;Stair training;Functional mobility training;Therapeutic activities;Therapeutic exercise;Balance training;Neuromuscular re-education;Patient/family education    PT Goals (Current goals can be found in the Care Plan section)  Acute Rehab PT Goals Patient Stated Goal: to go home PT Goal Formulation: With patient Time For Goal Achievement: 07/03/22 Potential to Achieve Goals: Fair    Frequency Min 1X/week     Co-evaluation               AM-PAC PT "6 Clicks" Mobility  Outcome Measure Help needed turning from your back to your side while in a flat bed without using bedrails?: A Little Help needed moving from lying on your back to sitting on the side of a flat bed without using bedrails?: A Little Help needed moving to and from a bed to a chair (including a wheelchair)?: A Little Help needed standing up from a chair using your arms (e.g., wheelchair or bedside chair)?: A Little Help needed to walk in hospital room?: A Little Help needed climbing 3-5 steps with a railing? : A Little 6 Click Score: 18    End of Session Equipment Utilized During Treatment: Gait belt Activity Tolerance: Patient tolerated treatment well Patient left: in bed;with call bell/phone within reach;with nursing/sitter in room;with bed alarm set Nurse Communication: Mobility status PT Visit Diagnosis: Other abnormalities of gait and mobility (R26.89);Unsteadiness on feet (R26.81);Repeated falls (R29.6)    Time: 1610-9604 PT Time Calculation (min) (ACUTE ONLY): 33 min   Charges:  PT Evaluation $PT Eval Moderate Complexity: 1 Mod PT Treatments $Gait Training: 23-37 mins        Shela Nevin, PT, DPT Acute Rehab Services [phone]   Gladys Damme 06/19/2022, 4:53 PM

## 2022-06-19 NOTE — Progress Notes (Addendum)
Patient and wife educated on fluid restriction in regards to heart failure and plan of care.

## 2022-06-19 NOTE — ED Provider Notes (Signed)
Received at shift change from Dr. Marlene Bast please see note for full detail  In short patient with complex medical history including proximal A-fib currently on Eliquis, CAD status post CABG, CHF, CKD stage III, hypertension, hyperlipidemia, CVA, presents from [place] due to chest pain.  Pay states that he went to the [place] for an EKG, while he was there he is having some left-sided chest pain, EKG revealed patient was in a flutter and was told to come to the emergency department.  Patient states that he has no complaints, states that he was having some left-sided chest pain but this since resolved  Per previous provider follow-up with cardiology for final disposition Physical Exam  BP (!) 176/108   Pulse 72   Temp 97.6 F (36.4 C) (Oral)   Resp 20   Ht 5\' 10"  (1.778 m)   Wt 122.5 kg   SpO2 97%   BMI 38.74 kg/m   Physical Exam Vitals and nursing note reviewed.  Constitutional:      General: He is not in acute distress.    Appearance: He is not ill-appearing.  HENT:     Head: Normocephalic and atraumatic.     Nose: No congestion.  Eyes:     Conjunctiva/sclera: Conjunctivae normal.  Cardiovascular:     Rate and Rhythm: Tachycardia present. Rhythm irregular.     Pulses: Normal pulses.     Heart sounds: No murmur heard.    No friction rub. No gallop.  Pulmonary:     Effort: No respiratory distress.     Breath sounds: No wheezing, rhonchi or rales.  Musculoskeletal:     Right lower leg: No edema.     Left lower leg: No edema.  Skin:    General: Skin is warm and dry.  Neurological:     Mental Status: He is alert.  Psychiatric:        Mood and Affect: Mood normal.     Procedures  Procedures  ED Course / MDM   Clinical Course as of 06/19/22 0241  Thu Jun 18, 2022  2143 EKG A-fib versus a flutter, rate 67, intervals are unremarkable, inferior Q waves, no acute ST changes. [JD]    Clinical Course User Index [JD] Fulton Reek, MD   Medical Decision Making Amount  and/or Complexity of Data Reviewed Labs: ordered.  Risk Prescription drug management. Decision regarding hospitalization.   Lab Tests:  I Ordered, and personally interpreted labs.  The pertinent results include: CBC unremarkable, BMP reveals glucose 125, creatinine 1.49, BNP 342, first troponin is 28, second troponin 29   Imaging Studies ordered:  I ordered imaging studies including chest x-ray I independently visualized and interpreted imaging which showed unremarkable I agree with the radiologist interpretation   Cardiac Monitoring:  The patient was maintained on a cardiac monitor.  I personally viewed and interpreted the cardiac monitored which showed an underlying rhythm of: Atrial flutter   Medicines ordered and prescription drug management:  I ordered medication including Lasix, hydralazine I have reviewed the patients home medicines and have made adjustments as needed  Critical Interventions:  N/A   Reevaluation:  Assessed the patient, resting company, BP is significantly elevated, will provide dose of hydralazine, he is agreement with admission at this time.   Consultations Obtained:  I requested consultation with the Dr. Hetty Ely cardiology,  and discussed lab and imaging findings as well as pertinent plan - they recommend: He personally evaluate the patient, patient is rate controlled this time, recommends hospital admission  blood pressure management, diuresis, continue with home medications, and will reassess in the morning to determine if cardioversion is needed. Spoke with Dr.Kakrakandy who will admit the patient    Test Considered:  N/A    Rule out I have low suspicion for ACS as history is atypical, EKG was without signs of ischemia.  Troponins are mildly elevated but they have plateaued, I suspect slight elevation is from being in a flutter as well as slight volume overload.  Low suspicion for PE as patient denies pleuritic chest pain, shortness of  breath, vital signs reassuring, states has been compliant with his Eliquis.  Low suspicion for AAA or aortic dissection as history is atypical, patient has low risk factors.  Low suspicion for systemic infection as patient is nontoxic-appearing, vital signs reassuring, no obvious source infection noted on exam.     Dispostion and problem list  After consideration of the diagnostic results and the patients response to treatment, I feel that the patent would benefit from admission.  CHF-likely exacerbated from new onset of a flutter, as well as poorly controlled hypertension, will need blood pressure management and possible cardioversion.             Carroll Sage, PA-C 06/19/22 0241    Gilda Crease, MD 06/22/22 [phone]

## 2022-06-19 NOTE — Progress Notes (Addendum)
Rounding Note    Patient Name: William Matthews Date of Encounter: 06/19/2022  [facility] Cardiologist: None   Subjective   Patient feels much better today. No chest pain and breathing improved. Net negative 2.2L  Inpatient Medications    Scheduled Meds:  amiodarone  100 mg Oral Daily   apixaban  5 mg Oral BID   atorvastatin  40 mg Oral QHS   carvedilol  3.125 mg Oral Daily   DULoxetine  20 mg Oral Daily   furosemide  40 mg Intravenous Q12H   mirabegron ER  50 mg Oral QHS   pantoprazole  80 mg Oral Q1200   tamsulosin  0.4 mg Oral QHS   Continuous Infusions:  PRN Meds: acetaminophen **OR** acetaminophen   Vital Signs    Vitals:   06/19/22 0300 06/19/22 0346 06/19/22 0713 06/19/22 0837  BP: (!) 167/99 (!) 166/89 (!) 160/91 (!) 164/99  Pulse: (!) 56 73 84 72  Resp: 18 (!) 22 19 16   Temp:  97.6 F (36.4 C) 97.9 F (36.6 C)   TempSrc:  Oral Oral   SpO2: 95% 98% 96% 96%  Weight:  119.3 kg    Height:  5\' 10"  (1.778 m)      Intake/Output Summary (Last 24 hours) at 06/19/2022 0947 Last data filed at 06/19/2022 1610 Gross per 24 hour  Intake 420 ml  Output 2450 ml  Net -2030 ml      06/19/2022    3:46 AM 06/18/2022   10:01 PM 05/25/2022    8:08 PM  Last 3 Weights  Weight (lbs) 262 lb 14.4 oz 270 lb 260 lb  Weight (kg) 119.251 kg 122.471 kg 117.935 kg      Telemetry    Afib, rate controlled - Personally Reviewed  ECG    Afib, rate controlled - Personally Reviewed  Physical Exam   GEN: No acute distress.   Neck: JVD difficult to assess due to body habitus Cardiac: Irregularly irregular Respiratory: Clear to auscultation bilaterally. GI: Soft, nontender, non-distended  MS: Trace edema, warm Neuro:  Nonfocal  Psych: Normal affect   Labs    High Sensitivity Troponin:   Recent Labs  Lab 06/18/22 2215 06/19/22 0024  TROPONINIHS 28* 29*     Chemistry Recent Labs  Lab 06/18/22 1512 06/18/22 2215  NA 137  --   K 4.3  --   CL 105   --   CO2 25  --   GLUCOSE 125*  --   BUN 17  --   CREATININE 1.49*  --   CALCIUM 8.9  --   PROT  --  7.1  ALBUMIN  --  3.9  AST  --  22  ALT  --  22  ALKPHOS  --  85  BILITOT  --  1.1  GFRNONAA 49*  --   ANIONGAP 7  --     Lipids No results for input(s): "CHOL", "TRIG", "HDL", "LABVLDL", "LDLCALC", "CHOLHDL" in the last 168 hours.  Hematology Recent Labs  Lab 06/18/22 1512  WBC 9.8  RBC 4.63  HGB 14.7  HCT 41.7  MCV 90.1  MCH 31.7  MCHC 35.3  RDW 12.7  PLT 175   Thyroid No results for input(s): "TSH", "FREET4" in the last 168 hours.  BNP Recent Labs  Lab 06/18/22 2215  BNP 342.1*    DDimer No results for input(s): "DDIMER" in the last 168 hours.   Radiology    DG Chest 1 View  Result Date:  06/18/2022 CLINICAL DATA:  Atrial fibrillation, shortness of breath EXAM: CHEST  1 VIEW COMPARISON:  Portable exam 1522 hours compared to 05/25/2022 FINDINGS: Normal heart size post median sternotomy and CABG. Mediastinal contours and pulmonary vascularity normal. Atherosclerotic calcification aorta. Lungs clear. No pulmonary infiltrate, pleural effusion, or pneumothorax. Osseous structures unremarkable. IMPRESSION: No acute abnormalities. Aortic Atherosclerosis (ICD10-I70.0). Electronically Signed   By: Ulyses Southward M.D.   On: 06/18/2022 15:37    Cardiac Studies   TTE 03/27/22   IMPRESSIONS    1. Left ventricular ejection fraction, by estimation, is 50 to 55%. The  left ventricle has low normal function. The left ventricle has no regional  wall motion abnormalities. There is moderate left ventricular hypertrophy.  Left ventricular diastolic  parameters were normal.   2. Right ventricular systolic function is normal. The right ventricular  size is normal. There is normal pulmonary artery systolic pressure.   3. The mitral valve is normal in structure. Mild mitral valve  regurgitation. No evidence of mitral stenosis.   4. The aortic valve is normal in structure. Aortic valve  regurgitation is  mild. No aortic stenosis is present.   5. The inferior vena cava is normal in size with greater than 50%  respiratory variability, suggesting right atrial pressure of 3 mmHg.    LHC 09/2015   Ost LAD lesion, 100 %stenosed. LIMA and is normal in caliber and widely patent Mid LAD to Dist LAD lesion, 100 %stenosed. 2nd Mrg lesion, 40 %stenosed. Origin to Prox Graft lesion, 100 %stenosed. Prox Cx to Dist Cx lesion, 20 %stenosed. Mid RCA-1 lesion, 40 %stenosed. Mid RCA-2 lesion, 100 %stenosed. Ost Cx lesion, 95 %stenosed. A STENT RESOLUTE INTEG 4.0X15 drug eluting stent was successfully placed. Post intervention, there is a 0% residual stenosis. There is moderate left ventricular systolic dysfunction. LV end diastolic pressure is mildly elevated. The left ventricular ejection fraction is 35-45% by visual estimate.   1. Severe 3 vessel obstructive CAD.     - 100% ostial LAD    -  95% in stent restenosis in the distal left main/ostial LCx    - 100% mid RCA. Some left to right collaterals to the distal vessel.  2. Patent LIMA to the second diagonal and LAD 3. Occluded SVG to the first diagonal and OM 4. Moderate LV dysfunction 5. Mildly elevated LVEDP 6. Successful repeat stenting of the left main/ostial LCx with DES using IVUS guidance.   Patient Profile     72 y.o. 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   Assessment & Plan    #Acute on Chronic Systolic HF: -Patient presented with chest pain (thought to be MSK due to tenderness with palpation), severe HTN, and mild volume overload in the setting of medication non-compliance -BNP 342; CXR without significant  edema -TTE in 12/2015 with EF 40-45% that improved to 50-55% in 03/2022 -Started on lasix 40mg  IV BID with improvement of symptoms; will continue today and likely change to PO tomorrow -BP remains severely elevated today -Will plan to start entresto 24/26mg  BID and spiro 12.5mg  daily pending renal function; start hydralazine 25mg  TID for now while awaiting labs -Start farxiga 10mg  daily -Increase coreg to 12.5mg  BID  #HTN  Urgency: -BP very elevated on admission with SBP 200 -Increase coreg to 12.5mg  BID -Start entresto 24/26mg  BID and spiro 12.5mg  daily pending renal function  #Chest Pain: -Atypical with pain with palpation of left chest -Trop flat in 30s -No further ischemic work-up at this time  #Known CAD s/p CABG: -Chest pain atypical and unlikely angina -Continue lipitor 40mg  daily -Not on ASA due to need for [facility]  #Paroxysmal Afib: -Remains in Afib this morning -Continue apixaban (has missed doses) -Continue home amiodarone -Given that he is well rate controlled, can plan for DCCV as outpatient if remains in Afib  #Right ICA Stenosis: -Thought to be mild by vascular surgery and unlikely to be causing facial droop/slurred speech with no plans for intervention -MRI head without evidence of infarct  #CKD IIIA: -Cr at baseline 1.4       For questions or updates, please contact [facility] Please consult [email or web address] for contact info under        Signed, Meriam Sprague, MD  06/19/2022, 9:47 AM

## 2022-06-19 NOTE — H&P (Signed)
History and Physical    William Matthews:[id number] DOB: 1950-12-06 DOA: 06/18/2022  Patient coming from: Home.  Chief Complaint: Shortness of breath.  HPI: William Matthews is a 72 y.o. male with history of systolic CHF with recovered EF, CAD s/p CABG and stenting, hypertension, chronic kidney disease stage III recently admitted in February 2024 for possible stroke had followed up with the [facility] yesterday and was found to be in A-fib.  At the time when examining patient also had some chest pain on palpation.  Patient states he has been getting more short of breath recently and has gained at least 10 pounds in weight.  ED Course: In the ER labs show BNP of 342 troponins remain flat at 28 and 29.  Creatinine 1.4 which is baseline.  Chest x-ray unremarkable.  EKG shows A-fib rate controlled.  Patient was given 40 mg IV Lasix and admitted for acute on chronic systolic heart failure.  Review of Systems: As per HPI, rest all negative.   Past Medical History:  Diagnosis Date   Anxiety    CAD (coronary artery disease)    a. s/p CABG 1994 with LIMA to D2 and LAD, SVG to D1 and ramus intermedius. b. acute inferior MI 2001 s/p rescue PTCA/stent to [facility].  b. NSTEMI 02/2014: s/p LHC with DES to oLCx c. 09/2015 PCI with DES to left main/ostial LCx   Chronic bronchitis (HCC)    "they say I get it q yr" (10/03/2015)   CKD (chronic kidney disease), stage II    Depression    ED (erectile dysfunction)    GERD (gastroesophageal reflux disease)    Hyperlipidemia    Hypertension    Ischemic cardiomyopathy    a. EF 48% by nuc in 2012.   Myocardial infarction [facility]) "several"   Nephrolithiasis    Obesity    Prediabetes     Past Surgical History:  Procedure Laterality Date   CARDIAC CATHETERIZATION N/A 02/19/2015   Procedure: Left Heart Cath and Cors/Grafts Angiography;  Surgeon: Kathleene Hazel, MD;  LAD & RCA 100%, LIMA-LAD 50%, SVG-OM-D1 100% stenosis, distal limb D1-OM ok, CFX 99%    CARDIAC CATHETERIZATION  02/19/2015   Procedure: Coronary Stent Intervention;  Surgeon: Kathleene Hazel, MD; 3.5 x 16 mm Promus Premier DES to the ostial CFX    CARDIAC CATHETERIZATION N/A 10/03/2015   Procedure: Left Heart Cath and Cors/Grafts Angiography;  Surgeon: Peter M Swaziland, MD;  Location: [facility] INVASIVE CV LAB;  Service: Cardiovascular;  Laterality: N/A;   CARDIAC CATHETERIZATION N/A 10/03/2015   Procedure: Coronary Stent Intervention;  Surgeon: Peter M Swaziland, MD;  Location: [facility] INVASIVE CV LAB;  Service: Cardiovascular;  Laterality: N/A;  DES- Resolute 4.0x15 to distal left main into the ostial circumflex artery   CARDIAC CATHETERIZATION N/A 10/03/2015   Procedure: Intravascular Ultrasound/IVUS;  Surgeon: Peter M Swaziland, MD;  Location: [facility] INVASIVE CV LAB;  Service: Cardiovascular;  Laterality: N/A;   CORONARY ANGIOPLASTY WITH STENT PLACEMENT  1994 - 2001 X 5   "stents each time"   CORONARY ANGIOPLASTY WITH STENT PLACEMENT  10/03/2015   CORONARY ARTERY BYPASS GRAFT  1994   LIMA-D2-LAD, SVG-D1-OM2   CYSTOSCOPY W/ STONE MANIPULATION       reports that he has never smoked. He has never used smokeless tobacco. He reports that he does not drink alcohol and does not use drugs.  Allergies  Allergen Reactions   Ciprofloxacin Hcl Other (See Comments)    Unknown  Peanut-Containing Drug Products Other (See Comments)    Unknown     Family History  Problem Relation Age of Onset   Coronary artery disease Mother        Died at age 35 of MI   Coronary artery disease Father        MI age 47    Prior to Admission medications   Medication Sig Start Date End Date Taking? Authorizing Provider  amiodarone (PACERONE) 200 MG tablet Take 100 mg by mouth daily. 06/18/20  Yes [provider]  apixaban (ELIQUIS) 2.5 MG TABS tablet Take 5 mg by mouth at bedtime.   Yes [provider]  atorvastatin (LIPITOR) 20 MG tablet Take 1 tablet (20 mg total) by mouth at bedtime. Patient  taking differently: Take 40 mg by mouth at bedtime. 03/28/22  Yes Osvaldo Shipper, MD  carvedilol (COREG) 6.25 MG tablet Take 0.5 tablets by mouth daily. 06/03/22  Yes [provider]  Cholecalciferol 250 MCG (10000 UT) TABS Take 1,000 Units by mouth daily. 06/03/22  Yes [provider]  DULoxetine (CYMBALTA) 20 MG capsule Take 20 mg by mouth daily. 04/22/22  Yes [provider]  esomeprazole (NEXIUM) 40 MG capsule Take 40 mg by mouth daily. 04/12/20  Yes [provider]  furosemide (LASIX) 40 MG tablet Take 40 mg by mouth daily. 07/10/20  Yes [provider]  lidocaine (LIDODERM) 5 % Place 1 patch onto the skin daily. Remove & Discard patch within 12 hours or as directed by MD 05/25/22  Yes Raspet, Denny Peon K, PA-C  mirabegron ER (MYRBETRIQ) 50 MG TB24 tablet Take 50 mg by mouth at bedtime. 06/03/22  Yes [provider]  mupirocin ointment (BACTROBAN) 2 % Apply 1 Application topically 2 (two) times daily. 05/25/22  Yes Raspet, Denny Peon K, PA-C  tamsulosin (FLOMAX) 0.4 MG CAPS capsule Take 0.4 mg by mouth at bedtime. 04/13/22  Yes [provider]  albuterol (VENTOLIN HFA) 108 (90 Base) MCG/ACT inhaler SMARTSIG:1-2 Puff(s) By Mouth Every 6 Hours PRN Patient not taking: Reported on 06/19/2022    [provider]    Physical Exam: Constitutional: Moderately built and nourished. Vitals:   06/19/22 0230 06/19/22 0245 06/19/22 0300 06/19/22 0346  BP: (!) 182/95 (!) 181/97 (!) 167/99 (!) 166/89  Pulse: 79 70 (!) 56 73  Resp: 20 (!) 21 18 (!) 22  Temp:    97.6 F (36.4 C)  TempSrc:    Oral  SpO2: (!) 77% 94% 95% 98%  Weight:    119.3 kg  Height:    5\' 10"  (1.778 m)   Eyes: Anicteric no pallor. ENMT: No discharge from the ears eyes nose and mouth. Neck: JVD elevated no mass felt. Respiratory: Normal ocular crepitations. Cardiovascular: S1-S2 heard. Abdomen: Soft nontender bowel sounds present. Musculoskeletal: Mild edema. Skin: No  rash. Neurologic: Alert awake oriented to time place and person.  Moves all extremities. Psychiatric: Appears normal.  Normal affect.   Labs on Admission: I have personally reviewed following labs and imaging studies  CBC: Recent Labs  Lab 06/18/22 1512  WBC 9.8  HGB 14.7  HCT 41.7  MCV 90.1  PLT 175   Basic Metabolic Panel: Recent Labs  Lab 06/18/22 1512  NA 137  K 4.3  CL 105  CO2 25  GLUCOSE 125*  BUN 17  CREATININE 1.49*  CALCIUM 8.9   GFR: Estimated Creatinine Clearance: 57.1 mL/min (A) (by C-G formula based on SCr of 1.49 mg/dL (H)). Liver Function Tests: Recent Labs  Lab 06/18/22 2215  AST 22  ALT 22  ALKPHOS 85  BILITOT 1.1  PROT 7.1  ALBUMIN 3.9   No results for input(s): "LIPASE", "AMYLASE" in the last 168 hours. No results for input(s): "AMMONIA" in the last 168 hours. Coagulation Profile: No results for input(s): "INR", "PROTIME" in the last 168 hours. Cardiac Enzymes: No results for input(s): "CKTOTAL", "CKMB", "CKMBINDEX", "TROPONINI" in the last 168 hours. BNP (last 3 results) No results for input(s): "PROBNP" in the last 8760 hours. HbA1C: No results for input(s): "HGBA1C" in the last 72 hours. CBG: No results for input(s): "GLUCAP" in the last 168 hours. Lipid Profile: No results for input(s): "CHOL", "HDL", "LDLCALC", "TRIG", "CHOLHDL", "LDLDIRECT" in the last 72 hours. Thyroid Function Tests: No results for input(s): "TSH", "T4TOTAL", "FREET4", "T3FREE", "THYROIDAB" in the last 72 hours. Anemia Panel: No results for input(s): "VITAMINB12", "FOLATE", "FERRITIN", "TIBC", "IRON", "RETICCTPCT" in the last 72 hours. Urine analysis:    Component Value Date/Time   COLORURINE STRAW (A) 03/26/2022 1614   APPEARANCEUR CLEAR 03/26/2022 1614   LABSPEC 1.008 03/26/2022 1614   PHURINE 5.0 03/26/2022 1614   GLUCOSEU NEGATIVE 03/26/2022 1614   HGBUR NEGATIVE 03/26/2022 1614   BILIRUBINUR NEGATIVE 03/26/2022 1614   KETONESUR NEGATIVE 03/26/2022  1614   PROTEINUR NEGATIVE 03/26/2022 1614   UROBILINOGEN 0.2 08/22/2020 1752   NITRITE NEGATIVE 03/26/2022 1614   LEUKOCYTESUR NEGATIVE 03/26/2022 1614   Sepsis Labs: @LABRCNTIP (procalcitonin:4,lacticidven:4) )No results found for this or any previous visit (from the past 240 hour(s)).   Radiological Exams on Admission: DG Chest 1 View  Result Date: 06/18/2022 CLINICAL DATA:  Atrial fibrillation, shortness of breath EXAM: CHEST  1 VIEW COMPARISON:  Portable exam 1522 hours compared to 05/25/2022 FINDINGS: Normal heart size post median sternotomy and CABG. Mediastinal contours and pulmonary vascularity normal. Atherosclerotic calcification aorta. Lungs clear. No pulmonary infiltrate, pleural effusion, or pneumothorax. Osseous structures unremarkable. IMPRESSION: No acute abnormalities. Aortic Atherosclerosis (ICD10-I70.0). Electronically Signed   By: Ulyses Southward M.D.   On: 06/18/2022 15:37    EKG: Independently reviewed.  A-fib rate controlled.  Assessment/Plan Principal Problem:   Acute on chronic systolic congestive heart failure (HCC) Active Problems:   Essential hypertension   CAD (coronary artery disease)   CKD (chronic kidney disease), stage II   Chronic kidney disease, stage 3a (HCC)   Acute CHF (congestive heart failure) (HCC)    Acute on chronic systolic heart failure last EF measured on 03/27/2022 showed improved EF of 50 to 55%.  Will keep patient on Lasix 40 mg IV every 12 closely follow intake output metabolic panel Daily weights.  If creatinine and blood pressure allows may have to restart patient's ARB. Hypertension uncontrolled likely contributing to patient's symptoms.  Presently on Lasix and Coreg.  May need to restart ARB if creatinine and blood pressure allows. CAD status post CABG and stenting.  Chest pain appears atypical and reproducible.  Seen by cardiology.  Troponins were flat. Atrial fibrillation rate controlled.  On amiodarone Coreg and Eliquis for  anticoagulation. Chronic kidney disease stage III creatinine is around baseline. GERD on PPI. Depression on Cymbalta.  Since patient is fluid overloaded with CHF will need more than 2 midnight stay in inpatient status.   DVT prophylaxis: Eliquis. Code Status: Full code. Family Communication: Patient's wife at the bedside. Disposition Plan: Cardiac telemetry. Consults called: Cardiology. Admission status: Inpatient.     Marland Kitchen

## 2022-06-19 NOTE — Progress Notes (Signed)
1350: RN notified cardiology about SBP trending 180s-190s post PT session. RN administered 1400 hydralazine and 1415 entresto. RN relayed to patient that RN will round to recheck BP and to have patient to be settled in bed for BP accuracy. At 1512, RN obtained BP and SBP is 177. Cardiology notified and relayed to recheck VS in the next 30 minutes to follow trend.  1530: RN obtained recheck VS. SBP currently 150 SBP, RN spoke to [facility] from cards, cardiology recommends to monitor BP. No new orders.

## 2022-06-19 NOTE — Consult Note (Signed)
Cardiology Consultation   Patient ID: William Matthews MRN: [id number]; DOB: [birth year]  Admit date: 06/18/2022 Date of Consult: 06/19/2022  PCP:  Patient, No Pcp Per   [facility] Providers Cardiologist:  None        Patient Profile:   William Matthews is a 72 y.o. male with a hx of paroxysmal A-fib (on Eliquis), CAD status post CABG (LIMA to LAD, D2; SVG to OM (occluded)) and DES to LCx, chronic systolic heart failure with recovered EF, CKD stage III, hypertension, HLD, hypothyroidism, severe obesity, recent CVA with carotid stenosis who is being seen 06/19/2022 for the evaluation of chest pain/A-fib at the request of ED.  History of Present Illness:   William Matthews states that for the past week or so he has noticed increasing dyspnea on exertion but otherwise has been feeling his normal self.  Denies any orthopnea or worsening lower extremity edema.  He has not had any exertional chest pain at home but was scheduled for [facility] today where he was noted to be in A-fib with normal rate and underwent an echocardiogram where he was having chest pain to palpation.  EF was reportedly lower at 40% and so he was instructed to come to the emergency department.   Does admit to some noncompliance with his medication including his antihypertensives and had missed a dose of Eliquis last week.  He denies any lightheadedness, palpitations, or syncope.  He does not know when he is in and out of atrial fibrillation.  He also denies chest pain similar to his prior cardiac events, stating that he felt like an elephant on his chest at those times.  This time he feels like he has a twinge in his left breast.  He states his blood pressure at home are systolics in the 140s to 150s.  He does not like taking his Lasix at home.  In the ED, he was afebrile, hypertensive with systolics in the 200s.  He was noted to be in A-fib with normal rate.  Satting well on room air.  Workup notable for creatinine  of 1.49 (near baseline), BNP 342, troponin 28.  EKG reviewed demonstrating likely coarse A-fib with regular rate, Q waves in 3 and aVF but no new ischemic changes.   Past Medical History:  Diagnosis Date   Anxiety    CAD (coronary artery disease)    a. s/p CABG 1994 with LIMA to D2 and LAD, SVG to D1 and ramus intermedius. b. acute inferior MI 2001 s/p rescue PTCA/stent to [facility].  b. NSTEMI 02/2014: s/p LHC with DES to oLCx c. 09/2015 PCI with DES to left main/ostial LCx   Chronic bronchitis (HCC)    "they say I get it q yr" (10/03/2015)   CKD (chronic kidney disease), stage II    Depression    ED (erectile dysfunction)    GERD (gastroesophageal reflux disease)    Hyperlipidemia    Hypertension    Ischemic cardiomyopathy    a. EF 48% by nuc in 2012.   Myocardial infarction [facility]) "several"   Nephrolithiasis    Obesity    Prediabetes     Past Surgical History:  Procedure Laterality Date   CARDIAC CATHETERIZATION N/A 02/19/2015   Procedure: Left Heart Cath and Cors/Grafts Angiography;  Surgeon: Kathleene Hazel, MD;  LAD & RCA 100%, LIMA-LAD 50%, SVG-OM-D1 100% stenosis, distal limb D1-OM ok, CFX 99%   CARDIAC CATHETERIZATION  02/19/2015   Procedure: Coronary Stent Intervention;  Surgeon:  Kathleene Hazel, MD; 3.5 x 16 mm Promus Premier DES to the ostial CFX    CARDIAC CATHETERIZATION N/A 10/03/2015   Procedure: Left Heart Cath and Cors/Grafts Angiography;  Surgeon: Peter M Swaziland, MD;  Location: [facility] INVASIVE CV LAB;  Service: Cardiovascular;  Laterality: N/A;   CARDIAC CATHETERIZATION N/A 10/03/2015   Procedure: Coronary Stent Intervention;  Surgeon: Peter M Swaziland, MD;  Location: [facility] INVASIVE CV LAB;  Service: Cardiovascular;  Laterality: N/A;  DES- Resolute 4.0x15 to distal left main into the ostial circumflex artery   CARDIAC CATHETERIZATION N/A 10/03/2015   Procedure: Intravascular Ultrasound/IVUS;  Surgeon: Peter M Swaziland, MD;  Location: [facility] INVASIVE CV LAB;  Service:  Cardiovascular;  Laterality: N/A;   CORONARY ANGIOPLASTY WITH STENT PLACEMENT  1994 - 2001 X 5   "stents each time"   CORONARY ANGIOPLASTY WITH STENT PLACEMENT  10/03/2015   CORONARY ARTERY BYPASS GRAFT  1994   LIMA-D2-LAD, SVG-D1-OM2   CYSTOSCOPY W/ STONE MANIPULATION       Home Medications:  Prior to Admission medications   Medication Sig Start Date End Date Taking? Authorizing Provider  albuterol (VENTOLIN HFA) 108 (90 Base) MCG/ACT inhaler SMARTSIG:1-2 Puff(s) By Mouth Every 6 Hours PRN    [provider]  amiodarone (PACERONE) 200 MG tablet Take by mouth. 06/18/20   [provider]  apixaban (ELIQUIS) 2.5 MG TABS tablet Take by mouth.    [provider]  aspirin EC 81 MG tablet Take 1 tablet (81 mg total) by mouth daily. 02/16/17   Lewayne Bunting, MD  atorvastatin (LIPITOR) 20 MG tablet Take 1 tablet (20 mg total) by mouth at bedtime. 03/28/22   Osvaldo Shipper, MD  clotrimazole-betamethasone (LOTRISONE) cream Apply 1 application topically 2 (two) times daily. 12/16/20   McDonald, Rachelle Hora, DPM  esomeprazole (NEXIUM) 40 MG capsule Take 40 mg by mouth daily. 04/12/20   [provider]  furosemide (LASIX) 40 MG tablet Take by mouth. 07/10/20   [provider]  lidocaine (LIDODERM) 5 % Place 1 patch onto the skin daily. Remove & Discard patch within 12 hours or as directed by MD 05/25/22   Raspet, Noberto Retort, PA-C  mupirocin ointment (BACTROBAN) 2 % Apply 1 Application topically 2 (two) times daily. 05/25/22   Raspet, Noberto Retort, PA-C  triamcinolone cream (KENALOG) 0.1 % Apply topically 2 (two) times daily. 03/03/22   [provider]    Inpatient Medications: Scheduled Meds:  hydrALAZINE  10 mg Intravenous Once   Continuous Infusions:  PRN Meds:   Allergies:    Allergies  Allergen Reactions   Ciprofloxacin Hcl Other (See Comments)    Unknown    Peanut-Containing Drug Products Other (See Comments)    Unknown     Social History:   Social  History   Socioeconomic History   Marital status: Divorced    Spouse name: Not on file   Number of children: 3   Years of education: Not on file   Highest education level: Not on file  Occupational History   Occupation: Retired  Tobacco Use   Smoking status: Never   Smokeless tobacco: Never  Substance and Sexual Activity   Alcohol use: No   Drug use: No   Sexual activity: Yes  Other Topics Concern   Not on file  Social History Narrative   Twenty years ago CAD and CABG,no chest pain since per patient but was eval., In ED for chest pain 1 month prev.-MI  r/o but no stress done. Took  meds x1 month after MI but caused ED so stopped them had testosterone check >199,wants cialis or testosterone replacement. "if i can't have sex,life isn't worth living".   Social Determinants of Health   Financial Resource Strain: Not on file  Food Insecurity: No Food Insecurity (03/26/2022)   Hunger Vital Sign    Worried About Running Out of Food in the Last Year: Never true    Ran Out of Food in the Last Year: Never true  Transportation Needs: No Transportation Needs (03/26/2022)   PRAPARE - Administrator, Civil Service (Medical): No    Lack of Transportation (Non-Medical): No  Physical Activity: Not on file  Stress: Not on file  Social Connections: Not on file  Intimate Partner Violence: Not At Risk (03/26/2022)   Humiliation, Afraid, Rape, and Kick questionnaire    Fear of Current or Ex-Partner: No    Emotionally Abused: No    Physically Abused: No    Sexually Abused: No    Family History:    Family History  Problem Relation Age of Onset   Coronary artery disease Mother        Died at age 90 of MI   Coronary artery disease Father        MI age 70     ROS:  Please see the history of present illness.  All other ROS reviewed and negative.     Physical Exam/Data:   Vitals:   06/19/22 0000 06/19/22 0015 06/19/22 0030 06/19/22 0038  BP: (!) 195/104 (!) 203/112 (!) 195/103    Pulse: 63 63 72   Resp: (!) 22 (!) 23 20   Temp:    97.6 F (36.4 C)  TempSrc:    Oral  SpO2: 98% 97% 97%   Weight:      Height:       No intake or output data in the 24 hours ending 06/19/22 0057    06/18/2022   10:01 PM 05/25/2022    8:08 PM 04/09/2022    9:28 AM  Last 3 Weights  Weight (lbs) 270 lb 260 lb 271 lb  Weight (kg) 122.471 kg 117.935 kg 122.925 kg     Body mass index is 38.74 kg/m.  General:  Well nourished, well developed, in no acute distress HEENT: normal Neck: JVD elevated to 12 mmHg Vascular: No carotid bruits; Distal pulses 2+ bilaterally Cardiac:  normal S1, S2; irregularly irregular; no murmur Lungs:  clear to auscultation bilaterally, no wheezing, rhonchi or rales  Abd: soft, nontender, no hepatomegaly  Ext: 1+ edema Musculoskeletal:  No deformities, BUE and BLE strength normal and equal Skin: warm and dry  Neuro:  CNs 2-12 intact, no focal abnormalities noted Psych:  Normal affect   EKG:  The EKG was personally reviewed and demonstrates: Coarse A-fib with regular rate, incomplete right bundle with Q waves in 3 and aVF.  No acute ischemic changes Telemetry:  Telemetry was personally reviewed and demonstrates: N/A  Relevant CV Studies:  TTE 03/27/22  IMPRESSIONS    1. Left ventricular ejection fraction, by estimation, is 50 to 55%. The  left ventricle has low normal function. The left ventricle has no regional  wall motion abnormalities. There is moderate left ventricular hypertrophy.  Left ventricular diastolic  parameters were normal.   2. Right ventricular systolic function is normal. The right ventricular  size is normal. There is normal pulmonary artery systolic pressure.   3. The mitral valve is normal in structure. Mild mitral valve  regurgitation. No evidence of mitral stenosis.   4. The aortic valve is normal in structure. Aortic valve regurgitation is  mild. No aortic stenosis is present.   5. The inferior vena cava is normal in size  with greater than 50%  respiratory variability, suggesting right atrial pressure of 3 mmHg.   LHC 09/2015  Ost LAD lesion, 100 %stenosed. LIMA and is normal in caliber and widely patent Mid LAD to Dist LAD lesion, 100 %stenosed. 2nd Mrg lesion, 40 %stenosed. Origin to Prox Graft lesion, 100 %stenosed. Prox Cx to Dist Cx lesion, 20 %stenosed. Mid RCA-1 lesion, 40 %stenosed. Mid RCA-2 lesion, 100 %stenosed. Ost Cx lesion, 95 %stenosed. A STENT RESOLUTE INTEG 4.0X15 drug eluting stent was successfully placed. Post intervention, there is a 0% residual stenosis. There is moderate left ventricular systolic dysfunction. LV end diastolic pressure is mildly elevated. The left ventricular ejection fraction is 35-45% by visual estimate.   1. Severe 3 vessel obstructive CAD.     - 100% ostial LAD    -  95% in stent restenosis in the distal left main/ostial LCx    - 100% mid RCA. Some left to right collaterals to the distal vessel.  2. Patent LIMA to the second diagonal and LAD 3. Occluded SVG to the first diagonal and OM 4. Moderate LV dysfunction 5. Mildly elevated LVEDP 6. Successful repeat stenting of the left main/ostial LCx with DES using IVUS guidance.   Laboratory Data:  High Sensitivity Troponin:   Recent Labs  Lab 06/18/22 2215  TROPONINIHS 28*     Chemistry Recent Labs  Lab 06/18/22 1512  NA 137  K 4.3  CL 105  CO2 25  GLUCOSE 125*  BUN 17  CREATININE 1.49*  CALCIUM 8.9  GFRNONAA 49*  ANIONGAP 7    Recent Labs  Lab 06/18/22 2215  PROT 7.1  ALBUMIN 3.9  AST 22  ALT 22  ALKPHOS 85  BILITOT 1.1   Lipids No results for input(s): "CHOL", "TRIG", "HDL", "LABVLDL", "LDLCALC", "CHOLHDL" in the last 168 hours.  Hematology Recent Labs  Lab 06/18/22 1512  WBC 9.8  RBC 4.63  HGB 14.7  HCT 41.7  MCV 90.1  MCH 31.7  MCHC 35.3  RDW 12.7  PLT 175   Thyroid No results for input(s): "TSH", "FREET4" in the last 168 hours.  BNP Recent Labs  Lab 06/18/22 2215   BNP 342.1*    DDimer No results for input(s): "DDIMER" in the last 168 hours.   Radiology/Studies:  DG Chest 1 View  Result Date: 06/18/2022 CLINICAL DATA:  Atrial fibrillation, shortness of breath EXAM: CHEST  1 VIEW COMPARISON:  Portable exam 1522 hours compared to 05/25/2022 FINDINGS: Normal heart size post median sternotomy and CABG. Mediastinal contours and pulmonary vascularity normal. Atherosclerotic calcification aorta. Lungs clear. No pulmonary infiltrate, pleural effusion, or pneumothorax. Osseous structures unremarkable. IMPRESSION: No acute abnormalities. Aortic Atherosclerosis (ICD10-I70.0). Electronically Signed   By: Ulyses Southward M.D.   On: 06/18/2022 15:37     Assessment and Plan:   Uncontrolled Hypertension Acute on chronic systolic heart failure exacerbation 2/2 HTN, med non-compliance Patient presents from the [place] after instruction from providers there given new A-fib and slightly reduced EF on transthoracic echocardiogram with mild complaints of chest pain.  His chest pain is atypical and likely musculoskeletal given the tenderness to palpation and not concurrent with his prior symptoms of CAD.  He does note that he has been more short of breath recently likely stemming from acute on  chronic systolic heart failure in the setting of uncontrolled hypertension and dietary/medication noncompliance.  BNP marginally elevated.  He would benefit from better blood pressure control and IV diuresis. -Admit to medicine -Recent antihypertensives were discontinued in the setting of stroke earlier this year, would restart ARB, beta-blocker -Recommend IV diuresis  3. pA.Fib on Eliquis Patient with paroxysmal A-fib that is asymptomatic.  His most recent cardioversion was 2 years ago.  Given his borderline EF I think restoration of sinus rhythm is important but needs better blood pressure control and volume control prior to attempt.  He has missed intermittent Eliquis dosage so discussion with  day cardiology team will need to be have in regards to if TEE should be considered prior. -Continue home Eliquis, patient should be on 5 mg twice daily from A-fib perspective, unclear why he is on 2.5 mg given he only meets 1 out of 3 criteria. -Continue amiodarone  4. Chest pain 5.  Coronary artery disease Presentation consistent with musculoskeletal pain, no evidence of acute coronary syndrome at this time.  Continue home aspirin and statin.  Risk Assessment/Risk Scores:        New York Heart Association (NYHA) Functional Class NYHA Class II  CHA2DS2-VASc Score =     This indicates a  % annual risk of stroke. The patient's score is based upon:           For questions or updates, please contact [facility] Please consult [email or web address] for contact info under    Signed, Thana Ates, MD  06/19/2022 12:57 AM

## 2022-06-19 NOTE — Progress Notes (Signed)
Heart Failure Nurse Navigator Progress Note  PCP: Patient, No Pcp Per PCP-Cardiologist: None Admission Diagnosis: Acute congestive heart failure, Atrial fibrillation.  Admitted from: [place] cardiologist  Presentation:   William Matthews presented from [facility] cardiologist for being in A- fib, shortness of breath,  BLE edema, some chest pain, BNP 342, BMI 37.72, EKG A-fib rate controlled, . CXR unremarkable, IV lasix given, patient reported to missing a couple doses of his eliquis recently.   Patient was educated on the sign and symptoms of heart failure, daily weights, when to call his doctor or go to the ED. Diet/ fluid restrictions, reported he eats bacon, eggs for breakfast with at time 1/2 gallon of milk. Education on taking all medication as prescribed and attending all medical appointments. Patient verbalized his understanding, a Hf TOC appointment was scheduled for 07/09/2022 @ 12 noon.   ECHO/ LVEF: 40%  Clinical Course:  Past Medical History:  Diagnosis Date   Anxiety    CAD (coronary artery disease)    a. s/p CABG 1994 with LIMA to D2 and LAD, SVG to D1 and ramus intermedius. b. acute inferior MI 2001 s/p rescue PTCA/stent to [facility].  b. NSTEMI 02/2014: s/p LHC with DES to oLCx c. 09/2015 PCI with DES to left main/ostial LCx   Chronic bronchitis (HCC)    "they say I get it q yr" (10/03/2015)   CKD (chronic kidney disease), stage II    Depression    ED (erectile dysfunction)    GERD (gastroesophageal reflux disease)    Hyperlipidemia    Hypertension    Ischemic cardiomyopathy    a. EF 48% by nuc in 2012.   Myocardial infarction [facility]) "several"   Nephrolithiasis    Obesity    Prediabetes      Social History   Socioeconomic History   Marital status: Divorced    Spouse name: Not on file   Number of children: 3   Years of education: Not on file   Highest education level: Not on file  Occupational History   Occupation: Retired  Tobacco Use   Smoking status: Never   Smokeless  tobacco: Never  Substance and Sexual Activity   Alcohol use: No   Drug use: No   Sexual activity: Yes  Other Topics Concern   Not on file  Social History Narrative   Twenty years ago CAD and CABG,no chest pain since per patient but was eval., In ED for chest pain 1 month prev.-MI  r/o but no stress done. Took meds x1 month after MI but caused ED so stopped them had testosterone check >199,wants cialis or testosterone replacement. "if i can't have sex,life isn't worth living".   Social Determinants of Health   Financial Resource Strain: Not on file  Food Insecurity: No Food Insecurity (06/19/2022)   Hunger Vital Sign    Worried About Running Out of Food in the Last Year: Never true    Ran Out of Food in the Last Year: Never true  Transportation Needs: No Transportation Needs (06/19/2022)   PRAPARE - Administrator, Civil Service (Medical): No    Lack of Transportation (Non-Medical): No  Physical Activity: Not on file  Stress: Not on file  Social Connections: Not on file   Education Assessment and Provision:  Detailed education and instructions provided on heart failure disease management including the following:  Signs and symptoms of Heart Failure When to call the physician Importance of daily weights Low sodium diet Fluid restriction Medication management  Anticipated future follow-up appointments  Patient education given on each of the above topics.  Patient acknowledges understanding via teach back method and acceptance of all instructions.  Education Materials:  "Living Better With Heart Failure" Booklet, HF zone tool, & Daily Weight Tracker Tool.  Patient has scale at home: yes Patient has pill box at home: NA    High Risk Criteria for Readmission and/or Poor Patient Outcomes: Heart failure hospital admissions (last 6 months): 1  No Show rate: 14% Difficult social situation: No Demonstrates medication adherence: Yes Primary Language: English Literacy  level: Reading, writing, and comprehension  Barriers of Care:   Diet/ fluid restrictions ( salt, fluid amounts)  Daily weights  Considerations/Referrals:   Referral made to Heart Failure Pharmacist Stewardship:Yes Referral made to Heart Failure CSW/NCM TOC: No Referral made to Heart & Vascular TOC clinic: Yes, 07/09/2022 @ 12 noon  Items for Follow-up on DC/TOC: Diet/ fluid restrictions ( bacon, eggs, etc salt, fluid amounts)  Daily weights Continued HF education   Rhae Hammock, BSN, RN Heart Failure Print production planner Chat Only

## 2022-06-19 NOTE — ED Notes (Signed)
ED TO INPATIENT HANDOFF REPORT  ED Nurse Name and Phone #: [phone]  S Name/Age/Gender William Matthews 72 y.o. male Room/Bed: 022C/022C  Code Status   Code Status: Prior  Home/SNF/Other Home Patient oriented to: self, place, time, and situation Is this baseline? Yes   Triage Complete: Triage complete  Chief Complaint Acute CHF (congestive heart failure) (HCC) [I50.9]  Triage Note Pt referred to the ED by [facility] cardiologist for being in A-fib. Pt states he was evaluated ten days ago and was not, but on evaluation today he was in afib. Pt denies CP, but endorses SOB.    Allergies Allergies  Allergen Reactions   Ciprofloxacin Hcl Other (See Comments)    Unknown    Peanut-Containing Drug Products Other (See Comments)    Unknown     Level of Care/Admitting Diagnosis ED Disposition     ED Disposition  Admit   Condition  --   Comment  Hospital Area: [facility] [[id number]] Level of Care: Telemetry Cardiac [103] May place patient in observation at [facility] or [facility] if equivalent level of care is available:: No Covid Evaluation: Asymptomatic - no recent expo sure (last 10 days) testing not required Diagnosis: Acute CHF (congestive heart failure) [facility]) [[id number]] Admitting Physician: Eduard Clos [phone] Attending Physician: Eduard Clos Florian.Pax          B Medical/Surgery History Past Medical History:  Diagnosis Date   Anxiety    CAD (coronary artery disease)    a. s/p CABG 1994 with LIMA to D2 and LAD, SVG to D1 and ramus intermedius. b. acute inferior MI 2001 s/p rescue PTCA/stent to [facility].  b. NSTEMI 02/2014: s/p LHC with DES to oLCx c. 09/2015 PCI with DES to left main/ostial LCx   Chronic bronchitis (HCC)    "they say I get it q yr" (10/03/2015)   CKD (chronic kidney disease), stage II    Depression    ED (erectile dysfunction)    GERD (gastroesophageal reflux disease)    Hyperlipidemia    Hypertension    Ischemic  cardiomyopathy    a. EF 48% by nuc in 2012.   Myocardial infarction [facility]) "several"   Nephrolithiasis    Obesity    Prediabetes    Past Surgical History:  Procedure Laterality Date   CARDIAC CATHETERIZATION N/A 02/19/2015   Procedure: Left Heart Cath and Cors/Grafts Angiography;  Surgeon: Kathleene Hazel, MD;  LAD & RCA 100%, LIMA-LAD 50%, SVG-OM-D1 100% stenosis, distal limb D1-OM ok, CFX 99%   CARDIAC CATHETERIZATION  02/19/2015   Procedure: Coronary Stent Intervention;  Surgeon: Kathleene Hazel, MD; 3.5 x 16 mm Promus Premier DES to the ostial CFX    CARDIAC CATHETERIZATION N/A 10/03/2015   Procedure: Left Heart Cath and Cors/Grafts Angiography;  Surgeon: Peter M Swaziland, MD;  Location: [facility] INVASIVE CV LAB;  Service: Cardiovascular;  Laterality: N/A;   CARDIAC CATHETERIZATION N/A 10/03/2015   Procedure: Coronary Stent Intervention;  Surgeon: Peter M Swaziland, MD;  Location: [facility] INVASIVE CV LAB;  Service: Cardiovascular;  Laterality: N/A;  DES- Resolute 4.0x15 to distal left main into the ostial circumflex artery   CARDIAC CATHETERIZATION N/A 10/03/2015   Procedure: Intravascular Ultrasound/IVUS;  Surgeon: Peter M Swaziland, MD;  Location: [facility] INVASIVE CV LAB;  Service: Cardiovascular;  Laterality: N/A;   CORONARY ANGIOPLASTY WITH STENT PLACEMENT  1994 - 2001 X 5   "stents each time"   CORONARY ANGIOPLASTY WITH STENT PLACEMENT  10/03/2015   CORONARY ARTERY BYPASS  GRAFT  1994   LIMA-D2-LAD, SVG-D1-OM2   CYSTOSCOPY W/ STONE MANIPULATION       A IV Location/Drains/Wounds Patient Lines/Drains/Airways Status     Active Line/Drains/Airways     Name Placement date Placement time Site Days   Peripheral IV 06/19/22 20 G 1" Posterior;Proximal;Right Forearm 06/19/22  0000  Forearm  less than 1            Intake/Output Last 24 hours  Intake/Output Summary (Last 24 hours) at 06/19/2022 0217 Last data filed at 06/19/2022 0216 Gross per 24 hour  Intake --  Output 1425 ml  Net -1425  ml    Labs/Imaging Results for orders placed or performed during the hospital encounter of 06/18/22 (from the past 48 hour(s))  Basic metabolic panel     Status: Abnormal   Collection Time: 06/18/22  3:12 PM  Result Value Ref Range   Sodium 137 135 - 145 mmol/L   Potassium 4.3 3.5 - 5.1 mmol/L   Chloride 105 98 - 111 mmol/L   CO2 25 22 - 32 mmol/L   Glucose, Bld 125 (H) 70 - 99 mg/dL    Comment: Glucose reference range applies only to samples taken after fasting for at least 8 hours.   BUN 17 8 - 23 mg/dL   Creatinine, Ser 1.02 (H) 0.61 - 1.24 mg/dL   Calcium 8.9 8.9 - 72.5 mg/dL   GFR, Estimated 49 (L) >60 mL/min    Comment: (NOTE) Calculated using the CKD-EPI Creatinine Equation (2021)    Anion gap 7 5 - 15    Comment: Performed at [facility], [address] [place], [place] [address]  CBC     Status: None   Collection Time: 06/18/22  3:12 PM  Result Value Ref Range   WBC 9.8 4.0 - 10.5 K/uL   RBC 4.63 4.22 - 5.81 MIL/uL   Hemoglobin 14.7 13.0 - 17.0 g/dL   HCT 03.4 74.2 - 59.5 %   MCV 90.1 80.0 - 100.0 fL   MCH 31.7 26.0 - 34.0 pg   MCHC 35.3 30.0 - 36.0 g/dL   RDW 63.8 75.6 - 43.3 %   Platelets 175 150 - 400 K/uL   nRBC 0.0 0.0 - 0.2 %    Comment: Performed at [facility], [address], [place], [place] [address]  Hepatic function panel     Status: None   Collection Time: 06/18/22 10:15 PM  Result Value Ref Range   Total Protein 7.1 6.5 - 8.1 g/dL   Albumin 3.9 3.5 - 5.0 g/dL   AST 22 15 - 41 U/L   ALT 22 0 - 44 U/L   Alkaline Phosphatase 85 38 - 126 U/L   Total Bilirubin 1.1 0.3 - 1.2 mg/dL   Bilirubin, Direct 0.2 0.0 - 0.2 mg/dL   Indirect Bilirubin 0.9 0.3 - 0.9 mg/dL    Comment: Performed at [facility], [address], [place], [place] [address]  Brain natriuretic peptide     Status: Abnormal   Collection Time: 06/18/22 10:15 PM  Result Value Ref Range   B Natriuretic Peptide 342.1 (H) 0.0 - 100.0 pg/mL    Comment: Performed at  [facility], [address] [place], [place] [address]  Troponin I (High Sensitivity)     Status: Abnormal   Collection Time: 06/18/22 10:15 PM  Result Value Ref Range   Troponin I (High Sensitivity) 28 (H) <18 ng/L    Comment: (NOTE) Elevated  high sensitivity troponin I (hsTnI) values and significant  changes across serial measurements may suggest ACS but many other  chronic and acute conditions are known to elevate hsTnI results.  Refer to the "Links" section for chest pain algorithms and additional  guidance. Performed at [facility], [address], [place], [place] [address]   Troponin I (High Sensitivity)     Status: Abnormal   Collection Time: 06/19/22 12:24 AM  Result Value Ref Range   Troponin I (High Sensitivity) 29 (H) <18 ng/L    Comment: (NOTE) Elevated high sensitivity troponin I (hsTnI) values and significant  changes across serial measurements may suggest ACS but many other  chronic and acute conditions are known to elevate hsTnI results.  Refer to the "Links" section for chest pain algorithms and additional  guidance. Performed at [facility], [address] [place], [place] [address]    DG Chest 1 View  Result Date: 06/18/2022 CLINICAL DATA:  Atrial fibrillation, shortness of breath EXAM: CHEST  1 VIEW COMPARISON:  Portable exam 1522 hours compared to 05/25/2022 FINDINGS: Normal heart size post median sternotomy and CABG. Mediastinal contours and pulmonary vascularity normal. Atherosclerotic calcification aorta. Lungs clear. No pulmonary infiltrate, pleural effusion, or pneumothorax. Osseous structures unremarkable. IMPRESSION: No acute abnormalities. Aortic Atherosclerosis (ICD10-I70.0). Electronically Signed   By: Ulyses Southward M.D.   On: 06/18/2022 15:37    Pending Labs Unresulted Labs (From admission, onward)    None       Vitals/Pain Today's Vitals   06/19/22 0015 06/19/22 0030 06/19/22 0038 06/19/22 0208  BP: (!) 203/112 (!)  195/103  (!) 176/108  Pulse: 63 72    Resp: (!) 23 20    Temp:   97.6 F (36.4 C)   TempSrc:   Oral   SpO2: 97% 97%    Weight:      Height:      PainSc:        Isolation Precautions No active isolations  Medications Medications  furosemide (LASIX) injection 40 mg (40 mg Intravenous Given 06/19/22 0036)  hydrALAZINE (APRESOLINE) injection 10 mg (10 mg Intravenous Given 06/19/22 0208)    Mobility walks     Focused Assessments Cardiac Assessment Handoff:    Lab Results  Component Value Date   TROPONINI 0.49 (HH) 10/04/2015   No results found for: "DDIMER" Does the Patient currently have chest pain? No    R Recommendations: See Admitting Provider Note  Report given to:   Additional Notes: Patient is a fall risk, requires 2 person assist for standing to urinate, multiple falls at home

## 2022-06-19 NOTE — Progress Notes (Signed)
PROGRESS NOTE    William Matthews  QIO:[id number] DOB: October 13, 1950 DOA: 06/18/2022 PCP: Patient, No Pcp Per   Brief Narrative:  HPI: William Matthews is a 72 y.o. male with history of systolic CHF with recovered EF, CAD s/p CABG and stenting, hypertension, chronic kidney disease stage III recently admitted in February 2024 for possible stroke had followed up with the [facility] yesterday and was found to be in A-fib.  At the time when examining patient also had some chest pain on palpation.  Patient states he has been getting more short of breath recently and has gained at least 10 pounds in weight.   ED Course: In the ER labs show BNP of 342 troponins remain flat at 28 and 29.  Creatinine 1.4 which is baseline.  Chest x-ray unremarkable.  EKG shows A-fib rate controlled.  Patient was given 40 mg IV Lasix and admitted for acute on chronic systolic heart failure.  Assessment & Plan:   Acute on chronic systolic heart failure -Likely in the setting of medication noncompliance.  Last EF measured on 03/27/2022 showed improved EF of 50 to 55%.  BNP: 342, chest x-ray without significant edema.  -Continue Lasix 40 mg IV every 12.  Continue to monitor INO's and daily weight. -Blood pressure has been elevated.  Cardiology on board recommended to start Entresto 24/26 mg twice daily and increased Coreg to 12.5 mg daily.  Hydralazine 25 mg 3 times daily and Farxiga 10 mg daily.  If renal function continues to be stable will add Aldactone tomorrow  Hypertension urgency: BP elevated at 200 upon admission. -Increase Coreg, start Entresto and hydralazine as above.  If renal function remain stable will add Aldactone 12.5 mg tomorrow -Continue to monitor BP closely.  CAD status post CABG and stenting.  Chest pain appears atypical and reproducible.  Seen by cardiology.  Troponins were flat.  Likely musculoskeletal  Atrial fibrillation rate controlled.  On amiodarone Coreg and Eliquis for anticoagulation. -Plan  for DCCV as outpatient if he remains in A-fib   Chronic kidney disease stage III creatinine is around baseline.  GERD on PPI.  Depression on Cymbalta.  DVT prophylaxis: Eliquis Code Status: Full code Family Communication: Patient's wife present at bedside.  Plan of care discussed with patient in length and he verbalized understanding and agreed with it. Disposition Plan: To be determined  Consultants:  Cardiology  Procedures:  None  Antimicrobials:  None  Status is: Inpatient    Subjective: Patient seen and examined.  Wife at the bedside.  Reports improvement in breathing.  Continues to have leg edema.  Denies chest pain, shortness of breath, palpitation, lightheadedness or dizziness.  No acute event overnight.  Objective: Vitals:   06/19/22 0346 06/19/22 0713 06/19/22 0837 06/19/22 1143  BP: (!) 166/89 (!) 160/91 (!) 164/99 (!) 157/106  Pulse: 73 84 72 60  Resp: (!) 22 19 16    Temp: 97.6 F (36.4 C) 97.9 F (36.6 C)    TempSrc: Oral Oral    SpO2: 98% 96% 96% 96%  Weight: 119.3 kg     Height: 5\' 10"  (1.778 m)       Intake/Output Summary (Last 24 hours) at 06/19/2022 1305 Last data filed at 06/19/2022 1100 Gross per 24 hour  Intake 420 ml  Output 3050 ml  Net -2630 ml   Filed Weights   06/18/22 2201 06/19/22 0346  Weight: 122.5 kg 119.3 kg    Examination:  General exam: Appears calm and comfortable, on room  air, obese, wife at the bedside Respiratory system: Clear to auscultation. Respiratory effort normal. Cardiovascular system: S1 & S2 heard, RRR. No JVD, murmurs, rubs, gallops or clicks.  Bilateral 1+ pitting edema positive Gastrointestinal system: Abdomen is nondistended, soft and nontender. No organomegaly or masses felt. Normal bowel sounds heard. Central nervous system: Alert and oriented. No focal neurological deficits. Extremities: Symmetric 5 x 5 power. Skin: No rashes, lesions or ulcers Psychiatry: Judgement and insight appear normal. Mood &  affect appropriate.    Data Reviewed: I have personally reviewed following labs and imaging studies  CBC: Recent Labs  Lab 06/18/22 1512 06/19/22 0938  WBC 9.8 10.2  NEUTROABS  --  6.8  HGB 14.7 14.4  HCT 41.7 42.9  MCV 90.1 91.5  PLT 175 186   Basic Metabolic Panel: Recent Labs  Lab 06/18/22 1512 06/19/22 1121  NA 137 136  K 4.3 3.5  CL 105 100  CO2 25 25  GLUCOSE 125* 122*  BUN 17 16  CREATININE 1.49* 1.42*  CALCIUM 8.9 8.7*   GFR: Estimated Creatinine Clearance: 60 mL/min (A) (by C-G formula based on SCr of 1.42 mg/dL (H)). Liver Function Tests: Recent Labs  Lab 06/18/22 2215  AST 22  ALT 22  ALKPHOS 85  BILITOT 1.1  PROT 7.1  ALBUMIN 3.9   No results for input(s): "LIPASE", "AMYLASE" in the last 168 hours. No results for input(s): "AMMONIA" in the last 168 hours. Coagulation Profile: No results for input(s): "INR", "PROTIME" in the last 168 hours. Cardiac Enzymes: No results for input(s): "CKTOTAL", "CKMB", "CKMBINDEX", "TROPONINI" in the last 168 hours. BNP (last 3 results) No results for input(s): "PROBNP" in the last 8760 hours. HbA1C: No results for input(s): "HGBA1C" in the last 72 hours. CBG: No results for input(s): "GLUCAP" in the last 168 hours. Lipid Profile: No results for input(s): "CHOL", "HDL", "LDLCALC", "TRIG", "CHOLHDL", "LDLDIRECT" in the last 72 hours. Thyroid Function Tests: Recent Labs    06/19/22 0938  TSH 5.856*   Anemia Panel: No results for input(s): "VITAMINB12", "FOLATE", "FERRITIN", "TIBC", "IRON", "RETICCTPCT" in the last 72 hours. Sepsis Labs: No results for input(s): "PROCALCITON", "LATICACIDVEN" in the last 168 hours.  No results found for this or any previous visit (from the past 240 hour(s)).    Radiology Studies: DG Chest 1 View  Result Date: 06/18/2022 CLINICAL DATA:  Atrial fibrillation, shortness of breath EXAM: CHEST  1 VIEW COMPARISON:  Portable exam 1522 hours compared to 05/25/2022 FINDINGS: Normal  heart size post median sternotomy and CABG. Mediastinal contours and pulmonary vascularity normal. Atherosclerotic calcification aorta. Lungs clear. No pulmonary infiltrate, pleural effusion, or pneumothorax. Osseous structures unremarkable. IMPRESSION: No acute abnormalities. Aortic Atherosclerosis (ICD10-I70.0). Electronically Signed   By: Ulyses Southward M.D.   On: 06/18/2022 15:37    Scheduled Meds:  amiodarone  100 mg Oral Daily   apixaban  5 mg Oral BID   atorvastatin  40 mg Oral QHS   [START ON 06/20/2022] carvedilol  12.5 mg Oral Daily   dapagliflozin propanediol  10 mg Oral Daily   DULoxetine  20 mg Oral Daily   furosemide  40 mg Intravenous Q12H   hydrALAZINE  25 mg Oral Q8H   mirabegron ER  50 mg Oral QHS   pantoprazole  80 mg Oral Q1200   tamsulosin  0.4 mg Oral QHS   Continuous Infusions:   LOS: 0 days   Time spent: 35 minutes   Cheryle Dark Estill Cotta, MD [facility]  If 7PM-7AM, please  contact night-coverage [email or web address] 06/19/2022, 1:05 PM

## 2022-06-19 NOTE — Progress Notes (Signed)
   Heart Failure Stewardship Pharmacist Progress Note   PCP: Patient, No Pcp Per PCP-Cardiologist: None   HPI:  72 YO male with a PMH of proximal A-fib (on Eliquis), CAD status post CABG, CHF, CKD stage III, hypertension, hyperlipidemia, CVA    Patient instructed to come to the ED from the [place] on 5/9 with chest pain and active atrial fibrillation. The patient also reports increasing dyspnea on exertion for ~1 week but denies orthopnea or worsening of lower extremity edema. TTE from 12/2015 showing an EF of 40-45%. Echo from 03/2022 showing an improved EF of 50-55%, moderate LVH, mild MVR, mild AVR, and RAP . CXR without significant edema. The patient admits to some noncompliance with medications as well, including antihypertensives and missing one dose of Eliquis this past week. He reports not like taking his Lasix as well.  Current HF Medications: Diuretic: furosemide 40 mg IV x1  Prior to admission HF Medications: Diuretic: furosemide 40 mg daily Beta blocker: carvedilol 3.125 mg daily (pt directed to take BID but only takes once daily)  Pertinent Lab Values: Serum creatinine 1.42, BUN 16, Potassium 3.5, Sodium 136, BNP 342, A1c 5.7%   Vital Signs: Weight: 262 lbs (admission weight: 270 lbs) Blood pressure: 160s/90s  Heart rate: 70s  I/O: -2L yesterday; net -2L  Medication Assistance / Insurance Benefits Check: Does the patient have prescription insurance?  Yes Type of insurance plan: [place], [place]  Outpatient Pharmacy:  Prior to admission outpatient pharmacy: [facility] Is the patient willing to use [facility] TOC pharmacy at discharge? Yes Is the patient willing to transition their outpatient pharmacy to utilize a [facility] outpatient pharmacy?  Pending    Assessment: 1. Acute on chronic systolic CHF (LVEF 50-55%), due to NICM. NYHA class II symptoms. - Scr stable at 1.42 (BL 1.3-1.5). Strict I's and O's. Keep K >4 and Mg >2. Recommend supplementing K today. Standing weights  recommended.  - Agree with increasing carvedilol to 12.5 mg BID given continued increased BP and mild volume overload, will cautiously watch HR given patient takes 3.125 mg once daily at home  - Agree with starting dapagliflozin today given stable Scr  - Consider Entresto initiation today given stable Scr and continued hypertension  - Consider starting spironolactone tomorrow if Scr remains stable after initiating Entresto and dapagliflozin today    Plan: 1) Medication changes recommended at this time: - Increase carvedilol to 12.5 mg BID and closely monitor HR  - Start Entresto 24/26 mg BID - Start Farxiga 10 mg daily - Consider starting spironolactone tomorrow if Scr remains stable  - Recommend KCl 40 mEq x1   2) Patient assistance: - Copays for Jardiance, Farxiga, and Entresto - $0  3)  Education  - To be completed prior to discharge   Cherylin Mylar, PharmD PGY1 Pharmacy Resident 5/10/20249:46 AM

## 2022-06-20 LAB — CBC
HCT: 48 % (ref 39.0–52.0)
Hemoglobin: 16.1 g/dL (ref 13.0–17.0)
MCH: 30.4 pg (ref 26.0–34.0)
MCHC: 33.5 g/dL (ref 30.0–36.0)
MCV: 90.7 fL (ref 80.0–100.0)
Platelets: 198 10*3/uL (ref 150–400)
RBC: 5.29 MIL/uL (ref 4.22–5.81)
RDW: 12.9 % (ref 11.5–15.5)
WBC: 12.2 10*3/uL — ABNORMAL HIGH (ref 4.0–10.5)
nRBC: 0 % (ref 0.0–0.2)

## 2022-06-20 LAB — BASIC METABOLIC PANEL
Anion gap: 8 (ref 5–15)
BUN: 17 mg/dL (ref 8–23)
CO2: 29 mmol/L (ref 22–32)
Calcium: 9.7 mg/dL (ref 8.9–10.3)
Chloride: 101 mmol/L (ref 98–111)
Creatinine, Ser: 1.61 mg/dL — ABNORMAL HIGH (ref 0.61–1.24)
GFR, Estimated: 45 mL/min — ABNORMAL LOW (ref >60)
Glucose, Bld: 143 mg/dL — ABNORMAL HIGH (ref 70–99)
Potassium: 4.3 mmol/L (ref 3.5–5.1)
Sodium: 138 mmol/L (ref 135–145)

## 2022-06-20 LAB — MAGNESIUM: Magnesium: 2 mg/dL (ref 1.7–2.4)

## 2022-06-20 MED ORDER — HYDRALAZINE HCL 50 MG PO TABS
50.0000 mg | ORAL_TABLET | Freq: Three times a day (TID) | ORAL | Status: DC
  Administered 2022-06-20 – 2022-06-21 (×3): 50 mg via ORAL
  Filled 2022-06-20 (×3): qty 1

## 2022-06-20 MED ORDER — CARVEDILOL 12.5 MG PO TABS
12.5000 mg | ORAL_TABLET | Freq: Two times a day (BID) | ORAL | Status: DC
  Administered 2022-06-20 – 2022-06-21 (×2): 12.5 mg via ORAL
  Filled 2022-06-20 (×2): qty 1

## 2022-06-20 NOTE — Evaluation (Signed)
Occupational Therapy Evaluation Patient Details Name: William Matthews MRN: [id number] DOB: 05-26-1950 Today's Date: 06/20/2022   History of Present Illness Pt is a 72 year old male admitted on 06/18/22 for SOB. Past history of systolic CHF with recovered EF, CAD s/p CABG and stenting, hypertension, chronic kidney disease stage III recently admitted in February 2024 for possible stroke had followed up with the [facility] yesterday and was found to be in A-fib.   Clinical Impression   Pt presents with decline in function and safety with ADLs and ADL mobility with impaired balance and endurance. PTA pt lived at home and was Ind with ADLs/selfcare, IADLs, was driving and used no AD for mobility. Pt currently requires min A to sit EOB, min A with LB ADLs, min guard A wit UB due to posterior leaning (he is able to self correct posture with verbal cues), min A with transfers/mobility using RW. Pt also fatigues easily. Pt's BP 131/89 during activity. Pt would benefit from acute OT services to address impairments to maximize level of function and safety     Recommendations for follow up therapy are one component of a multi-disciplinary discharge planning process, led by the attending physician.  Recommendations may be updated based on patient status, additional functional criteria and insurance authorization.   Assistance Recommended at Discharge Intermittent Supervision/Assistance  Patient can return home with the following A little help with bathing/dressing/bathroom;A little help with walking and/or transfers;Assist for transportation;Help with stairs or ramp for entrance    Functional Status Assessment  Patient has had a recent decline in their functional status and demonstrates the ability to make significant improvements in function in a reasonable and predictable amount of time.  Equipment Recommendations  None recommended by OT    Recommendations for Other Services       Precautions /  Restrictions Precautions Precautions: Fall Restrictions Weight Bearing Restrictions: No      Mobility Bed Mobility Overal bed mobility: Needs Assistance Bed Mobility: Supine to Sit, Sit to Supine     Supine to sit: Min assist Sit to supine: Supervision   General bed mobility comments: HHA for trunk elevation.    Transfers Overall transfer level: Needs assistance Equipment used: Rolling walker (2 wheels) Transfers: Sit to/from Stand Sit to Stand: Min assist                  Balance Overall balance assessment: Needs assistance Sitting-balance support: Bilateral upper extremity supported, Feet supported Sitting balance-Leahy Scale: Fair Sitting balance - Comments: Pt initally with posterior lean. Postural control: Posterior lean Standing balance support: Single extremity supported, No upper extremity supported, During functional activity Standing balance-Leahy Scale: Poor Standing balance comment: Pt very wobbly but no apparent LOB. Would benefit from DME                           ADL either performed or assessed with clinical judgement   ADL Overall ADL's : Needs assistance/impaired Eating/Feeding: Independent;Sitting   Grooming: Wash/dry hands;Wash/dry face;Min guard;Standing   Upper Body Bathing: Min guard;Sitting   Lower Body Bathing: Minimal assistance   Upper Body Dressing : Min guard;Sitting   Lower Body Dressing: Minimal assistance Lower Body Dressing Details (indicate cue type and reason): pt donned underwear Toilet Transfer: Minimal assistance;Ambulation;Rolling walker (2 wheels)   Toileting- Clothing Manipulation and Hygiene: Min guard;Sit to/from stand       Functional mobility during ADLs: Minimal assistance  Vision Ability to See in Adequate Light: 0 Adequate Patient Visual Report: No change from baseline       Perception     Praxis      Pertinent Vitals/Pain Pain Assessment Pain Assessment: No/denies pain      Hand Dominance Right   Extremity/Trunk Assessment Upper Extremity Assessment Upper Extremity Assessment: Overall WFL for tasks assessed   Lower Extremity Assessment Lower Extremity Assessment: Defer to PT evaluation   Cervical / Trunk Assessment Cervical / Trunk Assessment: Normal   Communication Communication Communication: No difficulties   Cognition Arousal/Alertness: Awake/alert Behavior During Therapy: WFL for tasks assessed/performed Overall Cognitive Status: Within Functional Limits for tasks assessed                                       General Comments       Exercises     Shoulder Instructions      Home Living Family/patient expects to be discharged to:: Private residence Living Arrangements: Children Available Help at Discharge: Family;Available 24 hours/day Type of Home: House Home Access: Stairs to enter [employer] of Steps: 3 Entrance Stairs-Rails: Can reach both Home Layout: One level     Bathroom Shower/Tub: Producer, television/film/video: Handicapped height Bathroom Accessibility: Yes   Home Equipment: Agricultural consultant (2 wheels);Cane - single point;Shower seat;Grab bars - toilet;Wheelchair - manual   Additional Comments: no family present but pt reports information above.      Prior Functioning/Environment Prior Level of Function : Independent/Modified Independent;Driving;History of Falls (last six months)             Mobility Comments: Pt reports using no AD around the house and SPC in the community. ADLs Comments: Ind with ADLs/selfcare, IADLs, was driving        OT Problem List: Decreased activity tolerance;Impaired balance (sitting and/or standing);Obesity;Decreased knowledge of use of DME or AE      OT Treatment/Interventions: Self-care/ADL training;DME and/or AE instruction;Balance training;Therapeutic activities;Patient/family education    OT Goals(Current goals can be found in the care  plan section) Acute Rehab OT Goals Patient Stated Goal: go home OT Goal Formulation: With patient/family Time For Goal Achievement: 07/04/22 Potential to Achieve Goals: Good ADL Goals Pt Will Perform Grooming: with supervision;with set-up;standing Pt Will Perform Upper Body Bathing: with supervision;with set-up Pt Will Perform Lower Body Bathing: with min guard assist;with supervision Pt Will Perform Upper Body Dressing: with supervision;with set-up Pt Will Perform Lower Body Dressing: with min guard assist;with supervision Pt Will Transfer to Toilet: with min guard assist;with supervision;ambulating;grab bars Pt Will Perform Toileting - Clothing Manipulation and hygiene: with min guard assist;with supervision;sit to/from stand Pt Will Perform Tub/Shower Transfer: with min guard assist;with supervision;ambulating  OT Frequency: Min 2X/week    Co-evaluation              AM-PAC OT "6 Clicks" Daily Activity     Outcome Measure Help from another person eating meals?: None Help from another person taking care of personal grooming?: A Little Help from another person toileting, which includes using toliet, bedpan, or urinal?: A Little Help from another person bathing (including washing, rinsing, drying)?: A Little Help from another person to put on and taking off regular upper body clothing?: A Little Help from another person to put on and taking off regular lower body clothing?: A Little 6 Click Score: 19   End of Session Equipment Utilized  During Treatment: Gait belt;Rolling walker (2 wheels)  Activity Tolerance: Patient limited by fatigue Patient left: in bed;with call bell/phone within reach;with bed alarm set;with family/visitor present  OT Visit Diagnosis: Unsteadiness on feet (R26.81);Other abnormalities of gait and mobility (R26.89);Muscle weakness (generalized) (M62.81)                Time: 1610-9604 OT Time Calculation (min): 25 min Charges:  OT General Charges $OT  Visit: 1 Visit OT Evaluation $OT Eval Moderate Complexity: 1 Mod OT Treatments $Therapeutic Activity: 8-22 mins    Galen Manila 06/20/2022, 12:47 PM

## 2022-06-20 NOTE — Progress Notes (Signed)
PROGRESS NOTE    William Matthews  JWJ:[id number] DOB: 05-Aug-1950 DOA: 06/18/2022 PCP: Patient, No Pcp Per   Brief Narrative:  HPI: William Matthews is a 72 y.o. male with history of systolic CHF with recovered EF, CAD s/p CABG and stenting, hypertension, chronic kidney disease stage III recently admitted in February 2024 for possible stroke had followed up with the [facility] yesterday and was found to be in A-fib.  At the time when examining patient also had some chest pain on palpation.  Patient states he has been getting more short of breath recently and has gained at least 10 pounds in weight.   ED Course: In the ER labs show BNP of 342 troponins remain flat at 28 and 29.  Creatinine 1.4 which is baseline.  Chest x-ray unremarkable.  EKG shows A-fib rate controlled.  Patient was given 40 mg IV Lasix and admitted for acute on chronic systolic heart failure.  Assessment & Plan:   Acute on chronic systolic heart failure -Likely in the setting of medication noncompliance.  Last EF measured on 03/27/2022 showed improved EF of 50 to 55%.  BNP: 342, chest x-ray without significant edema.  -Continue Lasix 40 mg IV every 12.  Continue to monitor INO's and daily weight. -Creatinine trended up.  Hold on Lasix per cardiology.  He is -4.6 L since admission  Hypertension urgency: BP continues to be elevated -Cardiology adjusted medication.  Added Coreg 12.5 mg twice daily, Entresto 24/26 mg twice daily, Farxiga 10 mg daily, hydralazine 25 mg 3 times daily, increase to 50 mg 3 times daily due to elevated blood pressure.  CAD status post CABG and stenting.  Chest pain appears atypical and reproducible.  Seen by cardiology.  Troponins were flat.  Likely musculoskeletal.  No ischemic workup at this time per cardiology  Atrial fibrillation rate controlled.  On amiodarone Coreg and Eliquis for anticoagulation. -Plan for DCCV as outpatient if he remains in A-fib  Chronic kidney disease stage III  creatinine slightly trended up.  Holding Lasix.  Continue to monitor kidney function closely  GERD on PPI.  Depression on Cymbalta.  Leukocytosis: He is afebrile.  Reactive?.  Repeat CBC tomorrow a.m.  Right ICA stenosis: -Mild by vascular surgery and unlikely causing facial droop/slurred speech with no plans for intervention. -MRI brain brain negative for any acute findings  DVT prophylaxis: Eliquis Code Status: Full code Family Communication: Patient's daughter present at bedside.  Plan of care discussed with patient in length and he verbalized understanding and agreed with it. Disposition Plan: To be determined  Consultants:  Cardiology  Procedures:  None  Antimicrobials:  None  Status is: Inpatient    Subjective: Patient seen and examined.  Patient's daughter at the bedside.  Patient reports improvement in breathing.  No chest pain, shortness of breath, palpitation, orthopnea or PND.  Reports a proved meant and leg swelling as well.  No acute events overnight. Objective: Vitals:   06/19/22 1947 06/19/22 2300 06/20/22 0425 06/20/22 0910  BP: (!) 144/88 (!) 171/96 (!) 144/89 (!) 141/88  Pulse: 68 81 85 80  Resp: 18 18 20 20   Temp: 98.5 F (36.9 C) (!) 97.5 F (36.4 C) 97.6 F (36.4 C)   TempSrc: Oral Oral Oral   SpO2: 98% 96% 96% 92%  Weight:   119.2 kg   Height:        Intake/Output Summary (Last 24 hours) at 06/20/2022 1142 Last data filed at 06/20/2022 0015 Gross per 24 hour  Intake 440 ml  Output 2450 ml  Net -2010 ml    Filed Weights   06/18/22 2201 06/19/22 0346 06/20/22 0425  Weight: 122.5 kg 119.3 kg 119.2 kg    Examination:  General exam: Appears calm and comfortable, on room air, obese, daughter at the bedside Respiratory system: Clear to auscultation. Respiratory effort normal. Cardiovascular system: S1 & S2 heard, RRR. No JVD, murmurs, rubs, gallops or clicks.  No pitting edema  gastrointestinal system: Abdomen is nondistended, soft and  nontender. No organomegaly or masses felt. Normal bowel sounds heard. Central nervous system: Alert and oriented. No focal neurological deficits. Extremities: Symmetric 5 x 5 power. Skin: No rashes, lesions or ulcers Psychiatry: Judgement and insight appear normal. Mood & affect appropriate.    Data Reviewed: I have personally reviewed following labs and imaging studies  CBC: Recent Labs  Lab 06/18/22 1512 06/19/22 0938 06/20/22 0055  WBC 9.8 10.2 12.2*  NEUTROABS  --  6.8  --   HGB 14.7 14.4 16.1  HCT 41.7 42.9 48.0  MCV 90.1 91.5 90.7  PLT 175 186 198    Basic Metabolic Panel: Recent Labs  Lab 06/18/22 1512 06/19/22 1121 06/20/22 0055  NA 137 136 138  K 4.3 3.5 4.3  CL 105 100 101  CO2 25 25 29   GLUCOSE 125* 122* 143*  BUN 17 16 17   CREATININE 1.49* 1.42* 1.61*  CALCIUM 8.9 8.7* 9.7  MG  --   --  2.0    GFR: Estimated Creatinine Clearance: 52.9 mL/min (A) (by C-G formula based on SCr of 1.61 mg/dL (H)). Liver Function Tests: Recent Labs  Lab 06/18/22 2215  AST 22  ALT 22  ALKPHOS 85  BILITOT 1.1  PROT 7.1  ALBUMIN 3.9    No results for input(s): "LIPASE", "AMYLASE" in the last 168 hours. No results for input(s): "AMMONIA" in the last 168 hours. Coagulation Profile: No results for input(s): "INR", "PROTIME" in the last 168 hours. Cardiac Enzymes: No results for input(s): "CKTOTAL", "CKMB", "CKMBINDEX", "TROPONINI" in the last 168 hours. BNP (last 3 results) No results for input(s): "PROBNP" in the last 8760 hours. HbA1C: No results for input(s): "HGBA1C" in the last 72 hours. CBG: No results for input(s): "GLUCAP" in the last 168 hours. Lipid Profile: No results for input(s): "CHOL", "HDL", "LDLCALC", "TRIG", "CHOLHDL", "LDLDIRECT" in the last 72 hours. Thyroid Function Tests: Recent Labs    06/19/22 0938  TSH 5.856*    Anemia Panel: No results for input(s): "VITAMINB12", "FOLATE", "FERRITIN", "TIBC", "IRON", "RETICCTPCT" in the last 72  hours. Sepsis Labs: No results for input(s): "PROCALCITON", "LATICACIDVEN" in the last 168 hours.  No results found for this or any previous visit (from the past 240 hour(s)).    Radiology Studies: DG Chest 1 View  Result Date: 06/18/2022 CLINICAL DATA:  Atrial fibrillation, shortness of breath EXAM: CHEST  1 VIEW COMPARISON:  Portable exam 1522 hours compared to 05/25/2022 FINDINGS: Normal heart size post median sternotomy and CABG. Mediastinal contours and pulmonary vascularity normal. Atherosclerotic calcification aorta. Lungs clear. No pulmonary infiltrate, pleural effusion, or pneumothorax. Osseous structures unremarkable. IMPRESSION: No acute abnormalities. Aortic Atherosclerosis (ICD10-I70.0). Electronically Signed   By: Ulyses Southward M.D.   On: 06/18/2022 15:37    Scheduled Meds:  amiodarone  100 mg Oral Daily   apixaban  5 mg Oral BID   atorvastatin  40 mg Oral QHS   carvedilol  12.5 mg Oral BID WC   dapagliflozin propanediol  10 mg Oral Daily  DULoxetine  20 mg Oral Daily   hydrALAZINE  50 mg Oral Q8H   mirabegron ER  50 mg Oral QHS   pantoprazole  80 mg Oral Q1200   sacubitril-valsartan  1 tablet Oral BID   tamsulosin  0.4 mg Oral QHS   Continuous Infusions:   LOS: 1 day   Time spent: 35 minutes   Nyela Cortinas Estill Cotta, MD [facility]  If 7PM-7AM, please contact night-coverage [email or web address] 06/20/2022, 11:42 AM

## 2022-06-20 NOTE — Progress Notes (Signed)
Rounding Note    Patient Name: William Matthews Date of Encounter: 06/20/2022  [facility] Cardiologist: None   Subjective   Patient says he is breathing better   Denies CP Family member says they can hear him breathing   Inpatient Medications    Scheduled Meds:  amiodarone  100 mg Oral Daily   apixaban  5 mg Oral BID   atorvastatin  40 mg Oral QHS   carvedilol  12.5 mg Oral Daily   dapagliflozin propanediol  10 mg Oral Daily   DULoxetine  20 mg Oral Daily   furosemide  40 mg Intravenous Q12H   hydrALAZINE  25 mg Oral Q8H   mirabegron ER  50 mg Oral QHS   pantoprazole  80 mg Oral Q1200   sacubitril-valsartan  1 tablet Oral BID   tamsulosin  0.4 mg Oral QHS   Continuous Infusions:  PRN Meds: acetaminophen **OR** acetaminophen   Vital Signs    Vitals:   06/19/22 1551 06/19/22 1947 06/19/22 2300 06/20/22 0425  BP: (!) 150/70 (!) 144/88 (!) 171/96 (!) 144/89  Pulse: 67 68 81 85  Resp: 16 18 18 20   Temp: 98.7 F (37.1 C) 98.5 F (36.9 C) (!) 97.5 F (36.4 C) 97.6 F (36.4 C)  TempSrc: Oral Oral Oral Oral  SpO2: 97% 98% 96% 96%  Weight:    119.2 kg  Height:        Intake/Output Summary (Last 24 hours) at 06/20/2022 0725 Last data filed at 06/20/2022 0015 Gross per 24 hour  Intake 620 ml  Output 3050 ml  Net -2430 ml      06/20/2022    4:25 AM 06/19/2022    3:46 AM 06/18/2022   10:01 PM  Last 3 Weights  Weight (lbs) 262 lb 12.6 oz 262 lb 14.4 oz 270 lb  Weight (kg) 119.2 kg 119.251 kg 122.471 kg      Telemetry    Afib, 80s  - Personally Reviewed    Physical Exam   GEN: No acute distress.   Neck: Neck is full   JVP does appear elevated  Cardiac: Irregularly irregular  No S3   Respiratory:  Rhonchi   Mild rales at bases   GI: Soft, nontender,   MS: Trace edema, warm   Labs    High Sensitivity Troponin:   Recent Labs  Lab 06/18/22 2215 06/19/22 0024  TROPONINIHS 28* 29*     Chemistry Recent Labs  Lab 06/18/22 1512  06/18/22 2215 06/19/22 1121 06/20/22 0055  NA 137  --  136 138  K 4.3  --  3.5 4.3  CL 105  --  100 101  CO2 25  --  25 29  GLUCOSE 125*  --  122* 143*  BUN 17  --  16 17  CREATININE 1.49*  --  1.42* 1.61*  CALCIUM 8.9  --  8.7* 9.7  MG  --   --   --  2.0  PROT  --  7.1  --   --   ALBUMIN  --  3.9  --   --   AST  --  22  --   --   ALT  --  22  --   --   ALKPHOS  --  85  --   --   BILITOT  --  1.1  --   --   GFRNONAA 49*  --  52* 45*  ANIONGAP 7  --  11 8  Lipids No results for input(s): "CHOL", "TRIG", "HDL", "LABVLDL", "LDLCALC", "CHOLHDL" in the last 168 hours.  Hematology Recent Labs  Lab 06/18/22 1512 06/19/22 0938 06/20/22 0055  WBC 9.8 10.2 12.2*  RBC 4.63 4.69 5.29  HGB 14.7 14.4 16.1  HCT 41.7 42.9 48.0  MCV 90.1 91.5 90.7  MCH 31.7 30.7 30.4  MCHC 35.3 33.6 33.5  RDW 12.7 13.0 12.9  PLT 175 186 198   Thyroid  Recent Labs  Lab 06/19/22 0938  TSH 5.856*    BNP Recent Labs  Lab 06/18/22 2215  BNP 342.1*    DDimer No results for input(s): "DDIMER" in the last 168 hours.   Radiology    DG Chest 1 View  Result Date: 06/18/2022 CLINICAL DATA:  Atrial fibrillation, shortness of breath EXAM: CHEST  1 VIEW COMPARISON:  Portable exam 1522 hours compared to 05/25/2022 FINDINGS: Normal heart size post median sternotomy and CABG. Mediastinal contours and pulmonary vascularity normal. Atherosclerotic calcification aorta. Lungs clear. No pulmonary infiltrate, pleural effusion, or pneumothorax. Osseous structures unremarkable. IMPRESSION: No acute abnormalities. Aortic Atherosclerosis (ICD10-I70.0). Electronically Signed   By: Ulyses Southward M.D.   On: 06/18/2022 15:37    Cardiac Studies   TTE 03/27/22   IMPRESSIONS    1. Left ventricular ejection fraction, by estimation, is 50 to 55%. The  left ventricle has low normal function. The left ventricle has no regional  wall motion abnormalities. There is moderate left ventricular hypertrophy.  Left ventricular  diastolic  parameters were normal.   2. Right ventricular systolic function is normal. The right ventricular  size is normal. There is normal pulmonary artery systolic pressure.   3. The mitral valve is normal in structure. Mild mitral valve  regurgitation. No evidence of mitral stenosis.   4. The aortic valve is normal in structure. Aortic valve regurgitation is  mild. No aortic stenosis is present.   5. The inferior vena cava is normal in size with greater than 50%  respiratory variability, suggesting right atrial pressure of 3 mmHg.    LHC 09/2015   Ost LAD lesion, 100 %stenosed. LIMA and is normal in caliber and widely patent Mid LAD to Dist LAD lesion, 100 %stenosed. 2nd Mrg lesion, 40 %stenosed. Origin to Prox Graft lesion, 100 %stenosed. Prox Cx to Dist Cx lesion, 20 %stenosed. Mid RCA-1 lesion, 40 %stenosed. Mid RCA-2 lesion, 100 %stenosed. Ost Cx lesion, 95 %stenosed. A STENT RESOLUTE INTEG 4.0X15 drug eluting stent was successfully placed. Post intervention, there is a 0% residual stenosis. There is moderate left ventricular systolic dysfunction. LV end diastolic pressure is mildly elevated. The left ventricular ejection fraction is 35-45% by visual estimate.   1. Severe 3 vessel obstructive CAD.     - 100% ostial LAD    -  95% in stent restenosis in the distal left main/ostial LCx    - 100% mid RCA. Some left to right collaterals to the distal vessel.  2. Patent LIMA to the second diagonal and LAD 3. Occluded SVG to the first diagonal and OM 4. Moderate LV dysfunction 5. Mildly elevated LVEDP 6. Successful repeat stenting of the left main/ostial LCx with DES using IVUS guidance.   Patient Profile     72 y.o. male with history of  paroxysmal A-fib (on Eliquis), CAD status post CABG (LIMA to LAD, D2; SVG to OM (occluded)) and DES to LCx, chronic systolic heart failure with recovered EF, CKD stage III, hypertension, HLD, hypothyroidism, severe obesity who presented to  the ER with  chest pain and rate controlled Afib for which Cardiology was consulted.   Note that patient normally follows with Dr Beverely Pace in [place]  Seen fall 2023   Assessment & Plan     #Chest Pain: -Atypical with pain with palpation of left chest -Trop flat in 30s - Known CAD but no  further ischemic work-up at this time  #Hx HFmrEF  Last echo Feb 2024  LVEF 50 to 55%  RVEF normal    Pt with some increased volume load on admit   Felt to be due to medial noncompliance     He has diuresed 4.6 L since admit, if accurate    Breathing is some better     With bump in Cr will hold further lasix today      Started on carvedilol 12.5 bid, entresto 24/26mg  BID  Started hydralazine 25mg  TID  farxiga 10mg  daily Will increase hydralazine to 50 tid as BP is still high Try to get REDs vest to evaluate     #HTN Urgency: -BP very elevated on admission with SBP 200 -BP is still not optimal -Continue to titrate meds    #Known CAD s/p CABG: -Chest pain atypical and unlikely angina -Continue lipitor 40mg  daily -Not on ASA due to need for [facility]  #Paroxysmal Afib: -Remains in Afib this morning  THis may be contributing to CHF exacerbation. -Continue apixaban  He has missed doses  Cannot cardiovert now    -Continue home amiodarone fro now   I do not think he will convert Pt will have to assess as outpt   #Right ICA Stenosis: -Thought to be mild by vascular surgery and unlikely to be causing facial droop/slurred speech with no plans for intervention -MRI head without evidence of infarct  #CKD IIIA: -Cr at baseline 1.4  Today was 1.61   Holding lasix        For questions or updates, please contact [facility] Please consult [email or web address] for contact info under        Signed, Dietrich Pates, MD  06/20/2022, 7:25 AM

## 2022-06-21 ENCOUNTER — Other Ambulatory Visit: Payer: Self-pay | Admitting: Physician Assistant

## 2022-06-21 DIAGNOSIS — I5022 Chronic systolic (congestive) heart failure: Secondary | ICD-10-CM

## 2022-06-21 DIAGNOSIS — I5023 Acute on chronic systolic (congestive) heart failure: Secondary | ICD-10-CM | POA: Diagnosis not present

## 2022-06-21 LAB — CBC
HCT: 45.1 % (ref 39.0–52.0)
Hemoglobin: 15.2 g/dL (ref 13.0–17.0)
MCH: 30.8 pg (ref 26.0–34.0)
MCHC: 33.7 g/dL (ref 30.0–36.0)
MCV: 91.5 fL (ref 80.0–100.0)
Platelets: 208 10*3/uL (ref 150–400)
RBC: 4.93 MIL/uL (ref 4.22–5.81)
RDW: 12.9 % (ref 11.5–15.5)
WBC: 12.6 10*3/uL — ABNORMAL HIGH (ref 4.0–10.5)
nRBC: 0 % (ref 0.0–0.2)

## 2022-06-21 LAB — BASIC METABOLIC PANEL
Anion gap: 11 (ref 5–15)
BUN: 24 mg/dL — ABNORMAL HIGH (ref 8–23)
CO2: 22 mmol/L (ref 22–32)
Calcium: 8.9 mg/dL (ref 8.9–10.3)
Chloride: 100 mmol/L (ref 98–111)
Creatinine, Ser: 1.91 mg/dL — ABNORMAL HIGH (ref 0.61–1.24)
GFR, Estimated: 37 mL/min — ABNORMAL LOW (ref >60)
Glucose, Bld: 147 mg/dL — ABNORMAL HIGH (ref 70–99)
Potassium: 3.6 mmol/L (ref 3.5–5.1)
Sodium: 133 mmol/L — ABNORMAL LOW (ref 135–145)

## 2022-06-21 LAB — MAGNESIUM: Magnesium: 2 mg/dL (ref 1.7–2.4)

## 2022-06-21 MED ORDER — AMLODIPINE BESYLATE 5 MG PO TABS: 5.0000 mg | ORAL_TABLET | Freq: Every day | ORAL | 0 refills | Status: DC

## 2022-06-21 MED ORDER — HYDRALAZINE HCL 25 MG PO TABS
25.0000 mg | ORAL_TABLET | Freq: Three times a day (TID) | ORAL | Status: DC
  Administered 2022-06-21: 25 mg via ORAL
  Filled 2022-06-21: qty 1

## 2022-06-21 MED ORDER — CARVEDILOL 12.5 MG PO TABS: 12.5000 mg | ORAL_TABLET | Freq: Two times a day (BID) | ORAL | 0 refills | Status: DC

## 2022-06-21 MED ORDER — APIXABAN 2.5 MG PO TABS: 5.0000 mg | ORAL_TABLET | Freq: Two times a day (BID) | ORAL | 0 refills | Status: DC

## 2022-06-21 MED ORDER — ATORVASTATIN CALCIUM 20 MG PO TABS: 40.0000 mg | ORAL_TABLET | Freq: Every day | ORAL | 0 refills | Status: DC

## 2022-06-21 MED ORDER — DAPAGLIFLOZIN PROPANEDIOL 10 MG PO TABS: 10.0000 mg | ORAL_TABLET | Freq: Every day | ORAL | 0 refills | Status: DC

## 2022-06-21 NOTE — Progress Notes (Signed)
Rounding Note    Patient Name: William Matthews Date of Encounter: 06/21/2022  [facility] Cardiologist: None   Subjective   Patient deneis SOB while in bed   no CP   Inpatient Medications    Scheduled Meds:  amiodarone  100 mg Oral Daily   apixaban  5 mg Oral BID   atorvastatin  40 mg Oral QHS   carvedilol  12.5 mg Oral BID WC   dapagliflozin propanediol  10 mg Oral Daily   DULoxetine  20 mg Oral Daily   hydrALAZINE  50 mg Oral Q8H   mirabegron ER  50 mg Oral QHS   pantoprazole  80 mg Oral Q1200   sacubitril-valsartan  1 tablet Oral BID   tamsulosin  0.4 mg Oral QHS   Continuous Infusions:  PRN Meds: acetaminophen **OR** acetaminophen   Vital Signs    Vitals:   06/20/22 1557 06/20/22 2007 06/21/22 0008 06/21/22 0538  BP: (!) 134/91 115/75 100/69 102/64  Pulse:   68   Resp: 20 18 17 18   Temp: 98.4 F (36.9 C) 98.6 F (37 C) 98.4 F (36.9 C) 98.2 F (36.8 C)  TempSrc: Oral Oral Oral Oral  SpO2: 95% 95% 98% 95%  Weight:    120.4 kg  Height:        Intake/Output Summary (Last 24 hours) at 06/21/2022 0659 Last data filed at 06/21/2022 0541 Gross per 24 hour  Intake 480 ml  Output 1750 ml  Net -1270 ml      06/21/2022    5:38 AM 06/20/2022    4:25 AM 06/19/2022    3:46 AM  Last 3 Weights  Weight (lbs) 265 lb 6.9 oz 262 lb 12.6 oz 262 lb 14.4 oz  Weight (kg) 120.4 kg 119.2 kg 119.251 kg      Telemetry    Afib, 80s  - Personally Reviewed    Physical Exam   GEN: No acute distress.   Neck: Neck is full   Cardiac: Irregularly irregular  No S3   Respiratory: CTA  GI: Soft, nontender,   MS: No LE  edema, warm   Labs    High Sensitivity Troponin:   Recent Labs  Lab 06/18/22 2215 06/19/22 0024  TROPONINIHS 28* 29*     Chemistry Recent Labs  Lab 06/18/22 2215 06/19/22 1121 06/20/22 0055 06/21/22 0040  NA  --  136 138 133*  K  --  3.5 4.3 3.6  CL  --  100 101 100  CO2  --  25 29 22   GLUCOSE  --  122* 143* 147*  BUN  --  16  17 24*  CREATININE  --  1.42* 1.61* 1.91*  CALCIUM  --  8.7* 9.7 8.9  MG  --   --  2.0 2.0  PROT 7.1  --   --   --   ALBUMIN 3.9  --   --   --   AST 22  --   --   --   ALT 22  --   --   --   ALKPHOS 85  --   --   --   BILITOT 1.1  --   --   --   GFRNONAA  --  52* 45* 37*  ANIONGAP  --  11 8 11     Lipids No results for input(s): "CHOL", "TRIG", "HDL", "LABVLDL", "LDLCALC", "CHOLHDL" in the last 168 hours.  Hematology Recent Labs  Lab 06/19/22 [phone] 06/20/22 0055 06/21/22 0040  WBC 10.2 12.2* 12.6*  RBC 4.69 5.29 4.93  HGB 14.4 16.1 15.2  HCT 42.9 48.0 45.1  MCV 91.5 90.7 91.5  MCH 30.7 30.4 30.8  MCHC 33.6 33.5 33.7  RDW 13.0 12.9 12.9  PLT 186 198 208   Thyroid  Recent Labs  Lab 06/19/22 0938  TSH 5.856*    BNP Recent Labs  Lab 06/18/22 2215  BNP 342.1*    DDimer No results for input(s): "DDIMER" in the last 168 hours.   Radiology    No results found.  Cardiac Studies   TTE 03/27/22   IMPRESSIONS    1. Left ventricular ejection fraction, by estimation, is 50 to 55%. The  left ventricle has low normal function. The left ventricle has no regional  wall motion abnormalities. There is moderate left ventricular hypertrophy.  Left ventricular diastolic  parameters were normal.   2. Right ventricular systolic function is normal. The right ventricular  size is normal. There is normal pulmonary artery systolic pressure.   3. The mitral valve is normal in structure. Mild mitral valve  regurgitation. No evidence of mitral stenosis.   4. The aortic valve is normal in structure. Aortic valve regurgitation is  mild. No aortic stenosis is present.   5. The inferior vena cava is normal in size with greater than 50%  respiratory variability, suggesting right atrial pressure of 3 mmHg.    LHC 09/2015   Ost LAD lesion, 100 %stenosed. LIMA and is normal in caliber and widely patent Mid LAD to Dist LAD lesion, 100 %stenosed. 2nd Mrg lesion, 40 %stenosed. Origin to  Prox Graft lesion, 100 %stenosed. Prox Cx to Dist Cx lesion, 20 %stenosed. Mid RCA-1 lesion, 40 %stenosed. Mid RCA-2 lesion, 100 %stenosed. Ost Cx lesion, 95 %stenosed. A STENT RESOLUTE INTEG 4.0X15 drug eluting stent was successfully placed. Post intervention, there is a 0% residual stenosis. There is moderate left ventricular systolic dysfunction. LV end diastolic pressure is mildly elevated. The left ventricular ejection fraction is 35-45% by visual estimate.   1. Severe 3 vessel obstructive CAD.     - 100% ostial LAD    -  95% in stent restenosis in the distal left main/ostial LCx    - 100% mid RCA. Some left to right collaterals to the distal vessel.  2. Patent LIMA to the second diagonal and LAD 3. Occluded SVG to the first diagonal and OM 4. Moderate LV dysfunction 5. Mildly elevated LVEDP 6. Successful repeat stenting of the left main/ostial LCx with DES using IVUS guidance.   Patient Profile     72 y.o. 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   Note that patient normally follows with Dr Beverely Pace in [facility]  Seen fall 2023   Assessment & Plan     #Chest Pain: -Atypical  Worse with palpation   -Trop flat in 30s   #Hx HFmrEF  Last echo Feb 2024  LVEF 50 to 55%  RVEF normal    Pt with some increased volume load on admit   Felt to be due to medial noncompliance     He has diuresed 4.6 L since admit, if accurate    Breathing Is improved   He is comfortable  today   With continued bump in Cr would hold lasix again   Current Rx includes  carvedilol 12.5 bid, entresto 24/26mg  BID  hydralazine 50 mg TID  farxiga 10mg  daily Unable to get REDs vest on weekend    He looks good today   Sleeping flat this am  LUngs Clear   I think he is OK for d/c   I would hold lasix until labs on Tuesday morning   (send home with 40 mg as this will    #Chest Pain: -Atypical  Worse with palpation   -Trop flat in 30s  #HTN Urgency: -BP very elevated on admission with SBP 200 Currently under very good control, a little low this morning For ease will switch from  hydralzine to amlodipine 5 mg     Hold Entresto tonight and tomorrow morning  I will look for labs and call patient     #CKD IIIA: -Cr at baseline 1.4  Yesterday 1.61  Today 1.91    Probably a little overdiuresis    REcomm holding lasix and Entresto   Get labs in AM  BMET    (send stat)    #Known CAD s/p CABG: -Chest pain atypical -Continue lipitor 40mg  daily -Not on ASA due to need for [facility]  #Paroxysmal Afib: -Continue apixaban  He admitted to missing doses   Cannot cardiovert now    -Continue home amiodarone for now   I do not think he will convert Pt will have to assess as outpt   #R ICA Stenosis: -Thought to be mild by vascular surgery and unlikely to be causing facial droop/slurred speech with no plans for intervention -MRI head without evidence of infarct       For questions or updates, please contact [facility] Please consult [email or web address] for contact info under        Signed, Dietrich Pates, MD  06/21/2022, 6:59 AM

## 2022-06-21 NOTE — Progress Notes (Signed)
Mobility Specialist Progress Note   06/21/22 1500  Mobility  Activity Ambulated with assistance in hallway  Level of Assistance Contact guard assist, steadying assist  Assistive Device Front wheel walker  Distance Ambulated (ft) 380 ft  Range of Motion/Exercises Active;All extremities  Activity Response Tolerated well   Patient received in supine and agreeable to participate. Required minimal HHA to stand + cues for hand placement. Ambulated min guard with slow steady gait. Returned to room without complaint or incident. Was left dangling EOB with all needs met, call bell in reach and RN present.  [place] Verdelle Valtierra, BS EXP Mobility Specialist Please contact via SecureChat or Rehab office at [phone]

## 2022-06-21 NOTE — Progress Notes (Addendum)
Dr. Tenny Craw recommended arranging BMET in office tomorrow to be run as stat. Since we can't schedule on [facility] [address] lab schedule over the weekend, will outline instruction to come to [facility] location for this tomorrow. Dr. Tenny Craw reports she will be looking out for the result.   Patient has HF TOC f/u already scheduled 5/30, OK to keep per Dr. Tenny Craw. Also sent separate msg to scheduling team to arrange 1 month gen cards f/u.

## 2022-06-21 NOTE — Progress Notes (Signed)
BMET stat tomorrow per Dr. Tenny Craw

## 2022-06-21 NOTE — Discharge Summary (Addendum)
Discharge Summary  [name] UJW:[id number] DOB: [birth year]  PCP: Patient, No Pcp Per  Admit date: 06/18/2022 Discharge date: 06/21/2022  Time spent: 35 minutes   Recommendations for Outpatient Follow-up:  Follow-up with cardiology tomorrow 06/22/2022, blood work will be done, with BMP. Take your medications as prescribed  Discharge Diagnoses:  Active Hospital Problems   Diagnosis Date Noted   Acute on chronic systolic congestive heart failure (HCC) 09/07/2019    Priority: High   Atrial fibrillation (HCC) 03/26/2022    Priority: 2.   CAD (coronary artery disease)     Priority: 4.   Essential hypertension 07/02/2010    Priority: 4.   Acute CHF (congestive heart failure) (HCC) 06/19/2022   Chronic kidney disease, stage 3a (HCC) 03/26/2022   CKD (chronic kidney disease), stage II 02/18/2015    Resolved Hospital Problems  No resolved problems to display.    Discharge Condition: Stable  Diet recommendation: Heart healthy carb modified diet.  Vitals:   06/21/22 0538 06/21/22 0719  BP: 102/64 116/81  Pulse:  70  Resp: 18 17  Temp: 98.2 F (36.8 C) 98.6 F (37 C)  SpO2: 95% 97%    History of present illness:  William Matthews is a 72 y.o. male with history of systolic CHF with recovered EF, CAD s/p CABG and stenting, hypertension, chronic kidney disease stage III recently admitted in February 2024 for possible stroke had followed up with the [facility] yesterday and was found to be in A-fib.  At the time when examining patient also had some chest pain on palpation.  Patient states he has been getting more short of breath recently and has gained at least 10 pounds in weight.   ED Course: In the ER labs show BNP of 342 troponins remain flat at 28 and 29.  Creatinine 1.4 which is baseline.  Chest x-ray unremarkable.  EKG shows A-fib rate controlled.  Patient was given 40 mg IV Lasix and admitted for acute on chronic systolic heart failure.  Hospital Course:  Principal  Problem:   Acute on chronic systolic congestive heart failure (HCC) Active Problems:   Atrial fibrillation (HCC)   Essential hypertension   CAD (coronary artery disease)   CKD (chronic kidney disease), stage II   Chronic kidney disease, stage 3a (HCC)   Acute CHF (congestive heart failure) (HCC)  Atypical chest pain, rule out ACS Patient is worse with palpation   Trop flat in 30s Seen by cardiology.   Acute on chronic HFmrEF  Last 2D echo done in Feb 2024  LVEF 50 to 55%  RVEF normal    Volume overload on presentation Diuresed 4.6 L since admission.  Breathing is much improved. Lasix, Marcelline Deist, and Entresto held due to AKI. Per cardiology continue carvedilol 12.5 mg twice daily, Norvasc 5 mg daily.  Hypertensive urgency, improved Continue Coreg and Norvasc as recommended by cardiology Follow-up with cardiology tomorrow  Prediabetes Last A1c 5.7 on 03/27/2022. Marcelline Deist held due to AKI, will be reassessed by cardiology with repeat BMP 06/22/22.  AKI on CKD 3A Baseline creatinine 1.4 Creatinine uptrending 1.91 Diuresis held.  Monitor urine output. Follow-up with cardiology, plan to repeat BMP tomorrow 06/22/2022. Avoid nephrotoxic agents and hypotension.   CAD s/p CABG Continue high intensity Lipitor as recommended by cardiology Not on antiplatelet, currently on Eliquis Management per cardiology  Paroxysmal Afib on Eliquis He admitted to missing doses   Continue home Eliquis 5 mg BID, for CVA prevention Continue Coreg for rate control and  amiodarone for rate and rhythm control. Follow-up with cardiology outpatient   R ICA Stenosis No evidence of infarct on brain MRI        Procedures: None  Consultations: Cardiology  Discharge Exam: BP 116/81 (BP Location: Right Arm)   Pulse 70   Temp 98.6 F (37 C) (Oral)   Resp 17   Ht 5\' 10"  (1.778 m)   Wt 120.4 kg   SpO2 97%   BMI 38.09 kg/m  General: 72 y.o. year-old male well developed well nourished in no acute  distress.  Alert and oriented x3. Cardiovascular: Regular rate and rhythm with no rubs or gallops.  No thyromegaly or JVD noted.   Respiratory: Clear to auscultation with no wheezes or rales. Good inspiratory effort. Abdomen: Soft nontender nondistended with normal bowel sounds x4 quadrants. Musculoskeletal: No lower extremity edema. 2/4 pulses in all 4 extremities. Skin: No ulcerative lesions noted or rashes, Psychiatry: Mood is appropriate for condition and setting  Discharge Instructions You were cared for by a hospitalist during your hospital stay. If you have any questions about your discharge medications or the care you received while you were in the hospital after you are discharged, you can call the unit and asked to speak with the hospitalist on call if the hospitalist that took care of you is not available. Once you are discharged, your primary care physician will handle any further medical issues. Please note that NO REFILLS for any discharge medications will be authorized once you are discharged, as it is imperative that you return to your primary care physician (or establish a relationship with a primary care physician if you do not have one) for your aftercare needs so that they can reassess your need for medications and monitor your lab values.   Allergies as of 06/21/2022       Reactions   Ciprofloxacin Hcl Other (See Comments)   Unknown    Peanut-containing Drug Products Other (See Comments)   Unknown         Medication List     STOP taking these medications    albuterol 108 (90 Base) MCG/ACT inhaler Commonly known as: VENTOLIN HFA   furosemide 40 MG tablet Commonly known as: LASIX   mupirocin ointment 2 % Commonly known as: BACTROBAN       TAKE these medications    amiodarone 200 MG tablet Commonly known as: PACERONE Take 100 mg by mouth daily.   amLODipine 5 MG tablet Commonly known as: NORVASC Take 1 tablet (5 mg total) by mouth daily.   apixaban  2.5 MG Tabs tablet Commonly known as: ELIQUIS Take 2 tablets (5 mg total) by mouth 2 (two) times daily. What changed: when to take this   atorvastatin 20 MG tablet Commonly known as: LIPITOR Take 2 tablets (40 mg total) by mouth at bedtime.   carvedilol 12.5 MG tablet Commonly known as: COREG Take 1 tablet (12.5 mg total) by mouth 2 (two) times daily with a meal. What changed:  medication strength how much to take when to take this   Cholecalciferol 250 MCG (10000 UT) Tabs Take 1,000 Units by mouth daily.   DULoxetine 20 MG capsule Commonly known as: CYMBALTA Take 20 mg by mouth daily.   esomeprazole 40 MG capsule Commonly known as: NEXIUM Take 40 mg by mouth daily.   lidocaine 5 % Commonly known as: Lidoderm Place 1 patch onto the skin daily. Remove & Discard patch within 12 hours or as directed by MD  mirabegron ER 50 MG Tb24 tablet Commonly known as: MYRBETRIQ Take 50 mg by mouth at bedtime.   tamsulosin 0.4 MG Caps capsule Commonly known as: FLOMAX Take 0.4 mg by mouth at bedtime.       Allergies  Allergen Reactions   Ciprofloxacin Hcl Other (See Comments)    Unknown    Peanut-Containing Drug Products Other (See Comments)    Unknown     Follow-up Information     [facility]. Go in 18 day(s).   Specialty: Cardiology Why: Hospital follow up 07/09/2022 @ 12 noon PLEASE bring a current medication list to appointment FREE valet parking , Entrance  C, off [address] information: [address] [id number] mc [place] [address] [phone]        [facility] at [facility] Follow up.   Specialty: Cardiology Why: [facility] [place] [facility] - please come to the [facility] office for labwork tomorrow morning between 8am-11am for bloodwork (basic metabolic panel). You do not need to be fasting. The general cardiology office will also be  calling you to arrange a follow-up appointment. This is in adittion to your Heart Failure clinic visit at [facility] above. This might be at a different location so please be sure to clarify the location when they call. Contact information: [address] [id number] mc [place] [address] [phone]                 The results of significant diagnostics from this hospitalization (including imaging, microbiology, ancillary and laboratory) are listed below for reference.    Significant Diagnostic Studies: DG Chest 1 View  Result Date: 06/18/2022 CLINICAL DATA:  Atrial fibrillation, shortness of breath EXAM: CHEST  1 VIEW COMPARISON:  Portable exam 1522 hours compared to 05/25/2022 FINDINGS: Normal heart size post median sternotomy and CABG. Mediastinal contours and pulmonary vascularity normal. Atherosclerotic calcification aorta. Lungs clear. No pulmonary infiltrate, pleural effusion, or pneumothorax. Osseous structures unremarkable. IMPRESSION: No acute abnormalities. Aortic Atherosclerosis (ICD10-I70.0). Electronically Signed   By: Ulyses Southward M.D.   On: 06/18/2022 15:37   DG Ribs Unilateral W/Chest Left  Result Date: 05/25/2022 CLINICAL DATA:  rib bruising after fall EXAM: LEFT RIBS AND CHEST - 3+ VIEW COMPARISON:  None Available. FINDINGS: The heart and mediastinal contours are unchanged. Aortic calcification. Bibasilar atelectasis. No focal consolidation. No pulmonary edema. No pleural effusion. No pneumothorax. BB marker overlies left chest. No fracture or other bone lesions are seen involving the left ribs. Redemonstration of several fractured sternotomy wires. IMPRESSION: 1. No acute displaced left rib fracture Please note, nondisplaced rib fractures may be occult on radiograph. 2. No acute cardiopulmonary abnormality. 3.  Aortic Atherosclerosis (ICD10-I70.0). Electronically Signed   By: Tish Frederickson M.D.   On: 05/25/2022 20:47     Microbiology: No results found for this or any previous visit (from the past 240 hour(s)).   Labs: Basic Metabolic Panel: Recent Labs  Lab 06/18/22 1512 06/19/22 1121 06/20/22 0055 06/21/22 0040  NA 137 136 138 133*  K 4.3 3.5 4.3 3.6  CL 105 100 101 100  CO2 25 25 29 22   GLUCOSE 125* 122* 143* 147*  BUN 17 16 17  24*  CREATININE 1.49* 1.42* 1.61* 1.91*  CALCIUM 8.9 8.7* 9.7 8.9  MG  --   --  2.0 2.0   Liver Function Tests: Recent Labs  Lab 06/18/22 2215  AST 22  ALT 22  ALKPHOS  85  BILITOT 1.1  PROT 7.1  ALBUMIN 3.9   No results for input(s): "LIPASE", "AMYLASE" in the last 168 hours. No results for input(s): "AMMONIA" in the last 168 hours. CBC: Recent Labs  Lab 06/18/22 1512 06/19/22 0938 06/20/22 0055 06/21/22 0040  WBC 9.8 10.2 12.2* 12.6*  NEUTROABS  --  6.8  --   --   HGB 14.7 14.4 16.1 15.2  HCT 41.7 42.9 48.0 45.1  MCV 90.1 91.5 90.7 91.5  PLT 175 186 198 208   Cardiac Enzymes: No results for input(s): "CKTOTAL", "CKMB", "CKMBINDEX", "TROPONINI" in the last 168 hours. BNP: BNP (last 3 results) Recent Labs    06/18/22 2215  BNP 342.1*    ProBNP (last 3 results) No results for input(s): "PROBNP" in the last 8760 hours.  CBG: No results for input(s): "GLUCAP" in the last 168 hours.     Signed:  Darlin Drop, MD [facility] 06/21/2022, 3:44 PM

## 2022-06-23 ENCOUNTER — Ambulatory Visit: Payer: No Typology Code available for payment source | Admitting: Physical Therapy

## 2022-06-23 ENCOUNTER — Telehealth: Payer: Self-pay

## 2022-06-23 DIAGNOSIS — Z79899 Other long term (current) drug therapy: Secondary | ICD-10-CM

## 2022-06-23 DIAGNOSIS — I5022 Chronic systolic (congestive) heart failure: Secondary | ICD-10-CM

## 2022-06-23 DIAGNOSIS — I509 Heart failure, unspecified: Secondary | ICD-10-CM

## 2022-06-23 NOTE — Telephone Encounter (Signed)
Per Dr Tenny Craw pt seen in the hospital over the past weekend and needs to be reminded to come for labwork in our office/ BMET.    TOC visit 07/23/22.

## 2022-06-24 ENCOUNTER — Ambulatory Visit: Payer: Medicare HMO

## 2022-06-24 NOTE — Telephone Encounter (Signed)
I called the pt since he did not show up for his labs today... he promises to come in early tomorrow 06/25/22... ordered stat for Dr Tenny Craw.

## 2022-06-25 ENCOUNTER — Ambulatory Visit: Payer: No Typology Code available for payment source | Admitting: Physical Therapy

## 2022-06-25 ENCOUNTER — Ambulatory Visit: Payer: Medicare HMO | Attending: Internal Medicine

## 2022-06-25 VITALS — BP 131/68 | HR 68

## 2022-06-25 DIAGNOSIS — R2689 Other abnormalities of gait and mobility: Secondary | ICD-10-CM | POA: Diagnosis present

## 2022-06-25 DIAGNOSIS — I5022 Chronic systolic (congestive) heart failure: Secondary | ICD-10-CM | POA: Diagnosis not present

## 2022-06-25 DIAGNOSIS — R2681 Unsteadiness on feet: Secondary | ICD-10-CM | POA: Diagnosis present

## 2022-06-25 DIAGNOSIS — Z79899 Other long term (current) drug therapy: Secondary | ICD-10-CM | POA: Diagnosis not present

## 2022-06-25 DIAGNOSIS — I509 Heart failure, unspecified: Secondary | ICD-10-CM | POA: Diagnosis not present

## 2022-06-25 DIAGNOSIS — M6281 Muscle weakness (generalized): Secondary | ICD-10-CM

## 2022-06-25 LAB — BASIC METABOLIC PANEL
BUN/Creatinine Ratio: 14 (ref 10–24)
BUN: 19 mg/dL (ref 8–27)
CO2: 25 mmol/L (ref 20–29)
Calcium: 9 mg/dL (ref 8.6–10.2)
Chloride: 103 mmol/L (ref 96–106)
Creatinine, Ser: 1.39 mg/dL — ABNORMAL HIGH (ref 0.76–1.27)
Glucose: 122 mg/dL — ABNORMAL HIGH (ref 70–99)
Potassium: 4.5 mmol/L (ref 3.5–5.2)
Sodium: 136 mmol/L (ref 134–144)
eGFR: 54 mL/min/{1.73_m2} — ABNORMAL LOW (ref >59)

## 2022-06-25 NOTE — Therapy (Signed)
OUTPATIENT PHYSICAL THERAPY NEURO TREATMENT NOTE/RE-EVAL       Patient Name: William Matthews MRN: [id number] DOB:08/15/1950, 72 y.o., male Today's Date: 06/26/2022   PCP: Loura Back, NP REFERRING PROVIDER: Osvaldo Shipper, MD  END OF SESSION:  PT End of Session - 06/26/22 1650     Visit Number 13    Number of Visits 21   renewal completed for 8 additional visits   Date for PT Re-Evaluation 07/24/22    Authorization Type [employer]    Authorization Time Period 04-16-22 - 06-09-22;  05-14-22 - 07-10-22;  06-25-22 - 07-24-22    PT Start Time 1316    PT Stop Time 1400    PT Time Calculation (min) 44 min    Equipment Utilized During Treatment Gait belt   no gait belt used due to bruised ribs on Rt side   Activity Tolerance Patient tolerated treatment well    Behavior During Therapy WFL for tasks assessed/performed                         Past Medical History:  Diagnosis Date   Anxiety    CAD (coronary artery disease)    a. s/p CABG 1994 with LIMA to D2 and LAD, SVG to D1 and ramus intermedius. b. acute inferior MI 2001 s/p rescue PTCA/stent to [facility].  b. NSTEMI 02/2014: s/p LHC with DES to oLCx c. 09/2015 PCI with DES to left main/ostial LCx   Chronic bronchitis (HCC)    "they say I get it q yr" (10/03/2015)   CKD (chronic kidney disease), stage II    Depression    ED (erectile dysfunction)    GERD (gastroesophageal reflux disease)    Hyperlipidemia    Hypertension    Ischemic cardiomyopathy    a. EF 48% by nuc in 2012.   Myocardial infarction [facility]) "several"   Nephrolithiasis    Obesity    Prediabetes    Past Surgical History:  Procedure Laterality Date   CARDIAC CATHETERIZATION N/A 02/19/2015   Procedure: Left Heart Cath and Cors/Grafts Angiography;  Surgeon: Kathleene Hazel, MD;  LAD & RCA 100%, LIMA-LAD 50%, SVG-OM-D1 100% stenosis, distal limb D1-OM ok, CFX 99%   CARDIAC CATHETERIZATION  02/19/2015   Procedure: Coronary Stent Intervention;  Surgeon:  Kathleene Hazel, MD; 3.5 x 16 mm Promus Premier DES to the ostial CFX    CARDIAC CATHETERIZATION N/A 10/03/2015   Procedure: Left Heart Cath and Cors/Grafts Angiography;  Surgeon: Peter M Swaziland, MD;  Location: [facility] INVASIVE CV LAB;  Service: Cardiovascular;  Laterality: N/A;   CARDIAC CATHETERIZATION N/A 10/03/2015   Procedure: Coronary Stent Intervention;  Surgeon: Peter M Swaziland, MD;  Location: [facility] INVASIVE CV LAB;  Service: Cardiovascular;  Laterality: N/A;  DES- Resolute 4.0x15 to distal left main into the ostial circumflex artery   CARDIAC CATHETERIZATION N/A 10/03/2015   Procedure: Intravascular Ultrasound/IVUS;  Surgeon: Peter M Swaziland, MD;  Location: [facility] INVASIVE CV LAB;  Service: Cardiovascular;  Laterality: N/A;   CORONARY ANGIOPLASTY WITH STENT PLACEMENT  1994 - 2001 X 5   "stents each time"   CORONARY ANGIOPLASTY WITH STENT PLACEMENT  10/03/2015   CORONARY ARTERY BYPASS GRAFT  1994   LIMA-D2-LAD, SVG-D1-OM2   CYSTOSCOPY W/ STONE MANIPULATION     Patient Active Problem List   Diagnosis Date Noted   Acute CHF (congestive heart failure) (HCC) 06/19/2022   Slurred speech 03/26/2022   Chronic kidney disease, stage 3a (HCC) 03/26/2022  Hypothyroidism 03/26/2022   Atrial fibrillation (HCC) 03/26/2022   Heart failure with mildly reduced ejection fraction (HFmrEF) (HCC) 03/26/2022   Pain due to onychomycosis of toenails of both feet 08/23/2020   BPH with obstruction/lower urinary tract symptoms 12/18/2019   Anticoagulant long-term use 09/07/2019   Acute on chronic systolic congestive heart failure (HCC) 09/07/2019   Mitral regurgitation 09/07/2019   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   Atrial flutter (HCC) 08/15/2019   Unstable angina (HCC) 10/03/2015   NSTEMI (non-ST elevated myocardial infarction) (HCC) 02/20/2015   Hyperlipidemia    Prediabetes    Obesity    ED (erectile dysfunction)    CAD (coronary artery disease)    CKD (chronic kidney disease), stage II  02/18/2015   Nonspecific abnormal unspecified cardiovascular function study 08/11/2010   Unstable angina pectoris (HCC) 07/02/2010   Essential hypertension 07/02/2010   Erectile dysfunction 07/02/2010    ONSET DATE: 03-26-22  REFERRING DIAG:  Diagnosis  G45.9 (ICD-10-CM) - TIA (transient ischemic attack)    THERAPY DIAG:  Muscle weakness (generalized) - Plan: PT plan of care cert/re-cert  Unsteadiness on feet - Plan: PT plan of care cert/re-cert  Other abnormalities of gait and mobility - Plan: PT plan of care cert/re-cert  Rationale for Evaluation and Treatment: Rehabilitation  SUBJECTIVE:                                                                                                                                                                                             SUBJECTIVE STATEMENT: Pt reports he was hospitalized last Thursday until Sunday due to CHF (5-9 - 06-21-22); states the MD referred him to PT at another clinic but he told him he was already being seen at [facility] [address] pt states MD instructed him to continue PT  Pt accompanied by: self  PERTINENT HISTORY:  72 y.o. male with medical history significant for PAF on Eliquis, CAD s/p CABG and DES, CHF, CKD stage IIIa, HTN, HLD, hypothyroidism, BPH who presented to the ED for evaluation of left facial droop and slurred speech. Patient states he initially noted left-sided facial droop beginning at the end of December associated with slurred speech.  These changes resolved the next day.  Since then he has had intermittent episodes of dizziness without syncope.  Patient was hospitalized for further management. (2-15 - 03-28-22)    PAIN:  Are you having pain? No pain Relieving factors:  Lidocaine patch helps Aggravating factors: no specific  PRECAUTIONS: intermittent dizziness due to stenosis: Fall   WEIGHT BEARING RESTRICTIONS: No  FALLS: Has patient fallen in last 6 months? Yes. Number of falls approx.  15  LIVING  ENVIRONMENT: Lives with: lives alone Lives in: House/apartment Stairs: No Has following equipment at home: None  PLOF: Independent  PATIENT GOALS: Improve walking and balance    TODAY'S TREATMENT:        Pt's HR 62 bpm, O2 98% at start of session                                                                                                                         DATE: 06-25-22  GAIT: Gait pattern: step through pattern, decreased ankle dorsiflexion- Left, and Left foot flat Distance walked: 15' - pt fatigued after this distance Assistive device utilized: None Level of assistance: SBA Comments:  pt needed intermittent cues to increase step length to reduce shuffling  O2 rate 96%,  HR 74 bpm after ambulating 315'    TODAY'S TREATMENT:                                                                                                                              DATE: 06-25-22  THEREX:  5x sit to stand - score 15.28 secs from high/low mat table without UE support; attempted sit to stand from standard chair but pt lost balance during transfer on first 3 reps so this test was discontinued from chair and changed to high low mat table for testing   NeuroRe-ed:  TUG score 13.93 secs without device  Standing Lt hip flexion, extension and abduction 10 reps each leg alternating to facilitate improved balance on each leg  End of session:  Vitals:   06/25/22 1345  BP: 131/68  Pulse: 68     05-19-22 Added standing on pillow or blankets at home with EO and EC to HEP MedbridgeAccess Code: [email or web address] URL: [email or web address] Date: 05/20/2022 Prepared by: Maebelle Munroe  Exercises - Standing Balance with Eyes Closed on Foam  - 1 x daily - 7 x weekly - 3 sets - 10 reps   Medbridge HEP - 05-14-22:   Access Code: [email or web address] URL: [email or web address] Date: 05/15/2022 Prepared by: Maebelle Munroe  Exercises - Single Leg Stance with Support  - 1 x daily - 7 x  weekly - 1 sets - 2-3 reps - 10 sec hold    Pt instructed in HEP for LLE strengthening; Medbridge Access Code: [email or web address] URL: [email or web address] Date: 04/21/2022 Prepared by: Maebelle Munroe  Exercises - Sit to Stand  - 1 x daily - 7 x weekly - 1  sets - 10 reps - Clamshell with Resistance  - 1 x daily - 7 x weekly - 1 sets - 10 reps - 3 sec hold - Standing Hip Abduction with Resistance at Ankles and Counter Support  - 1 x daily - 7 x weekly - 1 sets - 10 reps - Standing Hip Extension with Resistance at Ankles and Unilateral Counter Support  - 1 x daily - 7 x weekly - 1 sets - 10 reps - Standing Hip Flexion with Resistance Loop  - 1 x daily - 7 x weekly - 1 sets - 10 reps - 3 sec hold - Standing March with Counter Support  - 1 x daily - 7 x weekly - 1 sets - 10 reps - 3 hold - Standing Gastroc Stretch at Counter  - 1 x daily - 7 x weekly - 1 sets - 1 reps - 30 sec hold - Standing Bilateral Gastroc Stretch with Step  - 1 x daily - 7 x weekly - 1 sets - 1 reps - 20-30 hold   Access Code: [facility] URL: [email or web address] Date: 04/17/2022 Prepared by: Maebelle Munroe  Exercises - Sidelying Hip Abduction  - 1 x daily - 7 x weekly - 3 sets - 10 reps - Standing Heel Raise with Support  - 1 x daily - 7 x weekly - 3 sets - 10 reps - Single Leg Heel Raise  - 1 x daily - 7 x weekly - 3 sets - 10 reps - Heel Toe Raises with Counter Support  - 1 x daily - 7 x weekly - 3 sets - 10 reps   PATIENT EDUCATION: 05-14-22 Education details: added SLS to HEP  Person educated: Patient and Child(ren) Education method: Explanation, Demonstration, and Handouts Education comprehension: verbalized understanding and returned demonstration  HOME EXERCISE PROGRAM: Medbridge HEP  [email or web address]   GOALS: Goals reviewed with patient? Yes  SHORT TERM GOALS: Same as LTG's   LONG TERM GOALS: Target date: 05-15-22  Perform 5x sit to stand transfers in </= 15 secs to demo increased LE  strength and improved balance upon initial standing.  Baseline: 19.5 secs (4 reps); 17.75; 05-12-22  12.60 secs Goal status: Goal met 05-12-22  2.  Improve TUG score to </= 12.5 secs to reduce fall risk and demo improved functional mobility. Baseline:  13.5 secs with no device;  9.94 secs Goal status: Goal met 05-12-22  3.  Increase gait speed to >/= 3.2 ft/sec without device for increased gait efficiency. Baseline: 11.78 secs = 2.78 ft/sec no device;   05-12-22:  10.88, 10.31 = 3.18 ft/sec without device Goal status: Goal met 05-12-22  4.  Amb.  500' on flat, even surface without LOB and without Lt foot catching for improved safety with community ambulation/accessibility. Baseline:  Goal status: Goal met - 64' with no device   5.  Independent in HEP for LLE strengthening and balance. Baseline:  Goal status: Goal met 05-12-22  UPDATED LTG's:  TARGET DATE 06-19-22              1.  Perform 5x sit to stand transfers in </= 10 secs to demo increased LE strength and improved balance upon initial standing.  Baseline: 19.5 secs (4 reps); 17.75; 05-12-22  12.60 secs:  15.28 secs without UE support from high/low mat table Goal status: Ongoing as not met - 06-25-22  2.  Increase gait speed to >/= 3.5 ft/sec without device for increased gait efficiency. Baseline: 11.78 secs = 2.78 ft/sec no device;  05-12-22:  10.88, 10.31 = 3.18 ft/sec without device Goal status: Ongoing  3.  Amb.  700' (6 laps)  on flat, even surface without LOB and without Lt foot catching for improved safety with community ambulation/accessibility.  Baseline: 49' with no device with cues to increase initial heel contact to decrease shuffling   Goal status:  Ongoing  4.  Transfer floor to stand with UE support on object with supervision.  Baseline:  TBA  Goal status:  Ongoing  5.  Independent in updated HEP for balance and strengthening exercises.              Baseline:  TBA             Goal status:  Ongoing  UPDATED LTG's:   TARGET DATE 07-24-22              1.  Perform 5x sit to stand transfers in </= 10 secs to demo increased LE strength and improved balance upon initial standing.  Baseline: 19.5 secs (4 reps); 17.75; 05-12-22  12.60 secs:  15.28 secs without UE support from high/low mat table Goal status: Ongoing as not met - 06-25-22  2.  Increase gait speed to >/= 3.5 ft/sec without device for increased gait efficiency. Baseline: 11.78 secs = 2.78 ft/sec no device;   05-12-22:  10.88, 10.31 = 3.18 ft/sec without device Goal status: Ongoing  3.  Amb.  700' (6 laps)  on flat, even surface without LOB and without Lt foot catching for improved safety with community ambulation/accessibility.  Baseline: 26' with no device with cues to increase initial heel contact to decrease shuffling   Goal status:  Ongoing  4.  Transfer floor to stand with UE support on object with supervision.  Baseline:  TBA  Goal status:  Ongoing  5.  Independent in updated HEP for balance and strengthening exercises.              Baseline:  TBA             Goal status:  Ongoing   ASSESSMENT:  CLINICAL IMPRESSION: PT session focused on re-evaluation with reassessment of TUG score, ambulation distance for endurance assesement and 5x sit to stand.  Pt's TUG score decreased to 15.28 secs in today's session from 12.6 secs on 05-12-22.  Pt unable to perform sit to stand without UE support from standard chair in today's session with pt having LOB posteriorly during transition; test was changed to performing from high/low mat table for increased ease with transfer.  Pt amb. 315' with no device but with intermittent shuffling gait during this distance.  Pt was fatigued at end of session.  LTG's continued for additional 4 weeks per recert.   Cont with POC.     OBJECTIVE IMPAIRMENTS: Abnormal gait, decreased activity tolerance, decreased balance, decreased cognition, and decreased strength.   ACTIVITY LIMITATIONS: bending, squatting, stairs, transfers,  and locomotion level  PARTICIPATION LIMITATIONS: cleaning, laundry, driving, shopping, community activity, and yard work  PERSONAL FACTORS: Fitness, Past/current experiences, and 1 comorbidity: carotid stenosis (dizziness)  are also affecting patient's functional outcome.   REHAB POTENTIAL: Good  CLINICAL DECISION MAKING: Stable/uncomplicated  EVALUATION COMPLEXITY: Low  PLAN:  PT FREQUENCY: 2x/week   PT DURATION: 4 weeks  PLANNED INTERVENTIONS: Therapeutic exercises, Therapeutic activity, Neuromuscular re-education, Balance training, Gait training, Patient/Family education, Self Care, Stair training, and DME instructions  PLAN FOR NEXT SESSION: Cont. Balance, strengthening and gait; SLS on each leg   Melvyn Hommes, Donavan Burnet, PT 06/26/2022,  5:33 PM

## 2022-06-26 ENCOUNTER — Encounter: Payer: Self-pay | Admitting: Physical Therapy

## 2022-06-26 ENCOUNTER — Other Ambulatory Visit: Payer: Self-pay

## 2022-06-26 DIAGNOSIS — Z79899 Other long term (current) drug therapy: Secondary | ICD-10-CM

## 2022-06-26 DIAGNOSIS — I5022 Chronic systolic (congestive) heart failure: Secondary | ICD-10-CM

## 2022-06-26 DIAGNOSIS — I509 Heart failure, unspecified: Secondary | ICD-10-CM

## 2022-06-26 MED ORDER — FUROSEMIDE 40 MG PO TABS: 40.0000 mg | ORAL_TABLET | Freq: Every day | ORAL | 3 refills | Status: DC

## 2022-06-30 ENCOUNTER — Ambulatory Visit: Payer: No Typology Code available for payment source | Admitting: Physical Therapy

## 2022-07-01 ENCOUNTER — Ambulatory Visit (INDEPENDENT_AMBULATORY_CARE_PROVIDER_SITE_OTHER): Payer: No Typology Code available for payment source | Admitting: Neurology

## 2022-07-01 ENCOUNTER — Ambulatory Visit: Payer: Medicare HMO

## 2022-07-01 ENCOUNTER — Encounter: Payer: Self-pay | Admitting: Neurology

## 2022-07-01 VITALS — BP 137/71 | HR 57 | Ht 70.0 in | Wt 274.0 lb

## 2022-07-01 DIAGNOSIS — G459 Transient cerebral ischemic attack, unspecified: Secondary | ICD-10-CM | POA: Diagnosis not present

## 2022-07-01 DIAGNOSIS — I6521 Occlusion and stenosis of right carotid artery: Secondary | ICD-10-CM

## 2022-07-01 NOTE — Patient Instructions (Signed)
Continue to follow-up with vascular surgery for right carotid stenosis

## 2022-07-01 NOTE — Progress Notes (Signed)
[facility] Clinic Note - Initial Visit   Date: 07/01/2022   William Matthews MRN: [id number] DOB: 29-Oct-1950   Dear Loura Back, NP:  Thank you for your kind referral of William Matthews for consultation of left side weakness. Although his history is well known to you, please allow [place] to reiterate it for the purpose of our medical record. The patient was accompanied to the clinic by wife who also provides collateral information.     William Matthews is a 72 y.o. right-handed male with CAD s/p CABG, CKD, depression, anxiety, hyperlipidemia, hypertension, prediabetes, ischemic cardiomyopathy, and PAF presenting for evaluation of left side weakness.   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  BP is well-controlled on medication. Possible TIA manifesting with left facial droop (01/2022)  -  Continue secondary risk modification R ICA stenosis, followed by vascular surgery MRI brain was personally viewed and shows moderate generalized cerebral atrophy and enlarged ventricles, low suspicion for NPH   ------------------------------------------------------------- History of present illness: In December, patient was at home and developed left facial droop.  Symptoms lasted a day and self-resolved.  He did not seek medical attention. In February, he was admitted with left sided weakness.  MRI brain did not show acute stroke.  Radiology reports mentioned possible ventricular crowding concerning for NPH.  He does have falls and endorses urinary incontinence. He is getting out-patient PT for gait and started using a cane today.  CT angiogram showed a mild stenosis in the right ICA secondary to a focal calcific plaque. He was evaluated by vascular surgery who recommended medical management and repeat assessment in 6 months.   Out-side paper records, electronic medical record,  and images have been reviewed where available and summarized as:  CT/A head and neck 03/26/2022: 1. No acute intracranial process. 2. Nonopacification of the right V1 segment, with minimal reconstitution in the proximal V2 segment, more robust reconstitution in the distal V2 segment, and short segment occlusion of the distal V4 segment. The right PICA is poorly opacified but appears patent. 3. 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 No other hemodynamically significant stenosis in the neck. 4. Severe stenosis in a A3 branch of the left anterior cerebral artery. Other intracranial vasculature is patent but irregular. 5. Aortic atherosclerosis.  MRI brain wo contrast 03/27/2022: 1. No acute intracranial process. No evidence of acute or subacute infarct. 2. Ventriculomegaly, with crowding of the sulci near the vertex and an acute callosum angle, which can be seen in the setting of normal pressure hydrocephalus.  Lab Results  Component Value Date   HGBA1C 5.7 (H) 03/27/2022   No results found for: "VITAMINB12" Lab Results  Component Value Date   TSH 5.856 (H) 06/19/2022   No results found for: "ESRSEDRATE", "POCTSEDRATE"  Past Medical History:  Diagnosis Date   Anxiety    CAD (coronary artery disease)    a. s/p CABG 1994 with LIMA to D2 and LAD, SVG to D1 and ramus intermedius. b. acute inferior MI 2001 s/p rescue PTCA/stent to [facility].  b. NSTEMI 02/2014: s/p LHC with DES to oLCx c. 09/2015 PCI with DES to left main/ostial LCx   Chronic bronchitis (HCC)    "they say I get it q yr" (10/03/2015)   CKD (chronic kidney disease), stage II    Depression    ED (erectile dysfunction)    GERD (gastroesophageal reflux disease)    Hyperlipidemia  Hypertension    Ischemic cardiomyopathy    a. EF 48% by nuc in 2012.   Myocardial infarction [facility]) "several"   Nephrolithiasis    Obesity    Prediabetes     Past Surgical History:  Procedure Laterality Date   CARDIAC CATHETERIZATION N/A  02/19/2015   Procedure: Left Heart Cath and Cors/Grafts Angiography;  Surgeon: Kathleene Hazel, MD;  LAD & RCA 100%, LIMA-LAD 50%, SVG-OM-D1 100% stenosis, distal limb D1-OM ok, CFX 99%   CARDIAC CATHETERIZATION  02/19/2015   Procedure: Coronary Stent Intervention;  Surgeon: Kathleene Hazel, MD; 3.5 x 16 mm Promus Premier DES to the ostial CFX    CARDIAC CATHETERIZATION N/A 10/03/2015   Procedure: Left Heart Cath and Cors/Grafts Angiography;  Surgeon: Peter M Swaziland, MD;  Location[facility] INVASIVE CV LAB;  Service: Cardiovascular;  Laterality: N/A;   CARDIAC CATHETERIZATION N/A 10/03/2015   Procedure: Coronary Stent Intervention;  Surgeon: Peter M Swaziland, MD;  Location: [facility] INVASIVE CV LAB;  Service: Cardiovascular;  Laterality: N/A;  DES- Resolute 4.0x15 to distal left main into the ostial circumflex artery   CARDIAC CATHETERIZATION N/A 10/03/2015   Procedure: Intravascular Ultrasound/IVUS;  Surgeon: Peter M Swaziland, MD;  Location: [facility] INVASIVE CV LAB;  Service: Cardiovascular;  Laterality: N/A;   CORONARY ANGIOPLASTY WITH STENT PLACEMENT  1994 - 2001 X 5   "stents each time"   CORONARY ANGIOPLASTY WITH STENT PLACEMENT  10/03/2015   CORONARY ARTERY BYPASS GRAFT  1994   LIMA-D2-LAD, SVG-D1-OM2   CYSTOSCOPY W/ STONE MANIPULATION       Medications:  Outpatient Encounter Medications as of 07/01/2022  Medication Sig Note   amiodarone (PACERONE) 200 MG tablet Take 100 mg by mouth daily.    amLODipine (NORVASC) 5 MG tablet Take 1 tablet (5 mg total) by mouth daily.    apixaban (ELIQUIS) 2.5 MG TABS tablet Take 2 tablets (5 mg total) by mouth 2 (two) times daily. (Patient taking differently: Take 5 mg by mouth daily.)    atorvastatin (LIPITOR) 20 MG tablet Take 2 tablets (40 mg total) by mouth at bedtime.    carvedilol (COREG) 12.5 MG tablet Take 1 tablet (12.5 mg total) by mouth 2 (two) times daily with a meal.    Cholecalciferol 250 MCG (10000 UT) TABS Take 1,000 Units by mouth daily.     DULoxetine (CYMBALTA) 20 MG capsule Take 20 mg by mouth daily.    esomeprazole (NEXIUM) 40 MG capsule Take 40 mg by mouth daily.    lidocaine (LIDODERM) 5 % Place 1 patch onto the skin daily. Remove & Discard patch within 12 hours or as directed by MD 06/19/2022: Using as needed   mirabegron ER (MYRBETRIQ) 50 MG TB24 tablet Take 50 mg by mouth at bedtime.    tamsulosin (FLOMAX) 0.4 MG CAPS capsule Take 0.4 mg by mouth at bedtime.    furosemide (LASIX) 40 MG tablet Take 1 tablet (40 mg total) by mouth daily. (Patient not taking: Reported on 07/01/2022)    No facility-administered encounter medications on file as of 07/01/2022.    Allergies:  Allergies  Allergen Reactions   Ciprofloxacin Hcl Other (See Comments)    Unknown    Peanut-Containing Drug Products Other (See Comments)    Unknown     Family History: Family History  Problem Relation Age of Onset   Coronary artery disease Mother        Died at age 5 of MI   Coronary artery disease Father  MI age 70    Social History: Social History   Tobacco Use   Smoking status: Never   Smokeless tobacco: Never  Vaping Use   Vaping Use: Never used  Substance Use Topics   Alcohol use: No   Drug use: No   Social History   Social History Narrative   Twenty years ago CAD and CABG,no chest pain since per patient but was eval., In ED for chest pain 1 month prev.-MI  r/o but no stress done. Took meds x1 month after MI but caused ED so stopped them had testosterone check >199,wants cialis or testosterone replacement. "if i can't have sex,life isn't worth living".         Right Handed    Lives in a one story home    Vital Signs:  BP 137/71   Pulse (!) 57   Ht 5\' 10"  (1.778 m)   Wt 274 lb (124.3 kg)   SpO2 95%   BMI 39.31 kg/m    Neurological Exam: MENTAL STATUS including orientation to time, place, person, recent and remote memory, attention span and concentration, language, and fund of knowledge is normal.  Speech is not  dysarthric.  CRANIAL NERVES: II:  No visual field defects.     III-IV-VI: Pupils equal round and reactive to light.  Normal conjugate, extra-ocular eye movements in all directions of gaze.  No nystagmus.  No ptosis.   V:  Normal facial sensation.    VII:  Normal facial symmetry and movements.   VIII:  Normal hearing and vestibular function.   IX-X:  Normal palatal movement.   XI:  Normal shoulder shrug and head rotation.   XII:  Normal tongue strength and range of motion, no deviation or fasciculation.  MOTOR:  Motor strength is 5/5 throughout.  No atrophy, fasciculations or abnormal movements.  No pronator drift.   MSRs:                                           Right        Left brachioradialis 1+  1+  biceps 1+  1+  triceps 1+  1+  patellar 1+  1+  ankle jerk 1+  1+  Hoffman no  no  plantar response down  down   SENSORY:  Normal and symmetric perception of light touch, pinprick, vibration, and temperature.  COORDINATION/GAIT:  Intact rapid alternating movements bilaterally.  Gait is mildly wide based assisted with cane, stable.    Thank you for allowing me to participate in patient's care.  If I can answer any additional questions, I would be pleased to do so.    Sincerely,    Lynisha Osuch K. Allena Katz, DO

## 2022-07-02 ENCOUNTER — Ambulatory Visit: Payer: No Typology Code available for payment source | Admitting: Physical Therapy

## 2022-07-02 VITALS — HR 61

## 2022-07-02 DIAGNOSIS — R2689 Other abnormalities of gait and mobility: Secondary | ICD-10-CM

## 2022-07-02 DIAGNOSIS — M6281 Muscle weakness (generalized): Secondary | ICD-10-CM | POA: Diagnosis not present

## 2022-07-02 DIAGNOSIS — R2681 Unsteadiness on feet: Secondary | ICD-10-CM

## 2022-07-02 NOTE — Therapy (Signed)
OUTPATIENT PHYSICAL THERAPY NEURO TREATMENT NOTE       Patient Name: William Matthews MRN: [id number] DOB:1950-02-13, 72 y.o., male Today's Date: 07/03/2022   PCP: Loura Back, NP REFERRING PROVIDER: Osvaldo Shipper, MD  END OF SESSION:  PT End of Session - 07/03/22 1307     Visit Number 14    Number of Visits 21   renewal completed for 8 additional visits   Date for PT Re-Evaluation 07/24/22    Authorization Type [employer]    Authorization Time Period 04-16-22 - 06-09-22;  05-14-22 - 07-10-22;  06-25-22 - 07-24-22    PT Start Time 1017    PT Stop Time 1100    PT Time Calculation (min) 43 min    Equipment Utilized During Treatment Gait belt   no gait belt used due to bruised ribs on Rt side   Activity Tolerance Patient tolerated treatment well    Behavior During Therapy WFL for tasks assessed/performed                          Past Medical History:  Diagnosis Date   Anxiety    CAD (coronary artery disease)    a. s/p CABG 1994 with LIMA to D2 and LAD, SVG to D1 and ramus intermedius. b. acute inferior MI 2001 s/p rescue PTCA/stent to [facility].  b. NSTEMI 02/2014: s/p LHC with DES to oLCx c. 09/2015 PCI with DES to left main/ostial LCx   Chronic bronchitis (HCC)    "they say I get it q yr" (10/03/2015)   CKD (chronic kidney disease), stage II    Depression    ED (erectile dysfunction)    GERD (gastroesophageal reflux disease)    Hyperlipidemia    Hypertension    Ischemic cardiomyopathy    a. EF 48% by nuc in 2012.   Myocardial infarction [facility]) "several"   Nephrolithiasis    Obesity    Prediabetes    Past Surgical History:  Procedure Laterality Date   CARDIAC CATHETERIZATION N/A 02/19/2015   Procedure: Left Heart Cath and Cors/Grafts Angiography;  Surgeon: Kathleene Hazel, MD;  LAD & RCA 100%, LIMA-LAD 50%, SVG-OM-D1 100% stenosis, distal limb D1-OM ok, CFX 99%   CARDIAC CATHETERIZATION  02/19/2015   Procedure: Coronary Stent Intervention;  Surgeon:  Kathleene Hazel, MD; 3.5 x 16 mm Promus Premier DES to the ostial CFX    CARDIAC CATHETERIZATION N/A 10/03/2015   Procedure: Left Heart Cath and Cors/Grafts Angiography;  Surgeon: Peter M Swaziland, MD;  Location: [facility] INVASIVE CV LAB;  Service: Cardiovascular;  Laterality: N/A;   CARDIAC CATHETERIZATION N/A 10/03/2015   Procedure: Coronary Stent Intervention;  Surgeon: Peter M Swaziland, MD;  Location: [facility] INVASIVE CV LAB;  Service: Cardiovascular;  Laterality: N/A;  DES- Resolute 4.0x15 to distal left main into the ostial circumflex artery   CARDIAC CATHETERIZATION N/A 10/03/2015   Procedure: Intravascular Ultrasound/IVUS;  Surgeon: Peter M Swaziland, MD;  Location: [facility] INVASIVE CV LAB;  Service: Cardiovascular;  Laterality: N/A;   CORONARY ANGIOPLASTY WITH STENT PLACEMENT  1994 - 2001 X 5   "stents each time"   CORONARY ANGIOPLASTY WITH STENT PLACEMENT  10/03/2015   CORONARY ARTERY BYPASS GRAFT  1994   LIMA-D2-LAD, SVG-D1-OM2   CYSTOSCOPY W/ STONE MANIPULATION     Patient Active Problem List   Diagnosis Date Noted   Acute CHF (congestive heart failure) (HCC) 06/19/2022   Slurred speech 03/26/2022   Chronic kidney disease, stage 3a (HCC) 03/26/2022  Hypothyroidism 03/26/2022   Atrial fibrillation (HCC) 03/26/2022   Heart failure with mildly reduced ejection fraction (HFmrEF) (HCC) 03/26/2022   Pain due to onychomycosis of toenails of both feet 08/23/2020   BPH with obstruction/lower urinary tract symptoms 12/18/2019   Anticoagulant long-term use 09/07/2019   Acute on chronic systolic congestive heart failure (HCC) 09/07/2019   Mitral regurgitation 09/07/2019   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   Atrial flutter (HCC) 08/15/2019   Unstable angina (HCC) 10/03/2015   NSTEMI (non-ST elevated myocardial infarction) (HCC) 02/20/2015   Hyperlipidemia    Prediabetes    Obesity    ED (erectile dysfunction)    CAD (coronary artery disease)    CKD (chronic kidney disease), stage II  02/18/2015   Nonspecific abnormal unspecified cardiovascular function study 08/11/2010   Unstable angina pectoris (HCC) 07/02/2010   Essential hypertension 07/02/2010   Erectile dysfunction 07/02/2010    ONSET DATE: 03-26-22  REFERRING DIAG:  Diagnosis  G45.9 (ICD-10-CM) - TIA (transient ischemic attack)    THERAPY DIAG:  Muscle weakness (generalized)  Unsteadiness on feet  Other abnormalities of gait and mobility  Rationale for Evaluation and Treatment: Rehabilitation  SUBJECTIVE:                                                                                                                                                                                             SUBJECTIVE STATEMENT: Pt reports he saw Dr. Allena Katz yesterday - says "they don't need to see me anymore"; pt reports he fell out of bed on Tuesday morning - "just rolled off bed onto the floor" - reason for cancelling PT appt on Tuesday  Pt accompanied by: self  PERTINENT HISTORY:  72 y.o. male with medical history significant for PAF on Eliquis, CAD s/p CABG and DES, CHF, CKD stage IIIa, HTN, HLD, hypothyroidism, BPH who presented to the ED for evaluation of left facial droop and slurred speech. Patient states he initially noted left-sided facial droop beginning at the end of December associated with slurred speech.  These changes resolved the next day.  Since then he has had intermittent episodes of dizziness without syncope.  Patient was hospitalized for further management. (2-15 - 03-28-22)    PAIN:  Are you having pain? No pain - reports ribs are still sore from the previous falls Relieving factors:  Lidocaine patch helps Aggravating factors: no specific  PRECAUTIONS: intermittent dizziness due to stenosis: Fall   WEIGHT BEARING RESTRICTIONS: No  FALLS: Has patient fallen in last 6 months? Yes. Number of falls approx. 15  LIVING ENVIRONMENT: Lives with: lives alone Lives in: House/apartment Stairs: No Has  following equipment at home: None  PLOF: Independent  PATIENT GOALS: Improve walking and balance    TODAY'S TREATMENT:         Vitals:   07/02/22 1059  Pulse: 61  SpO2: 94%                                                                                                                            DATE: 07-02-22  GAIT: Gait pattern: step through pattern, decreased ankle dorsiflexion- Left, and Left foot flat Distance walked: clinic distances Assistive device utilized: None Level of assistance: SBA Comments:  pt needed intermittent cues to increase step length; noted mild decreased step length of RLE - pt able to lift RLE and increase step length with verbal cues       THEREX:  Pt performed sit to stand from mat table 5 times without UE support but used back of legs against mat table intermittently to stabilize and assist with balance recovery   Leg press 60# 3 sets 10 reps Step up exercise LLE 10 reps onto 1st step (6") with bil. UE support Standing hip exercises -3# weight used - Lt hip abduction and extension 10 reps each; RLE started to tremor after these exercises so further exercise was stopped due to fatigue and near end of session  NeuroRe-ed:  Pt performed SLS activity - touching 3 colored discs on floor - in various order - PT named colors in various sequences - to improve SLS on coordination of LLE; pt used counter top for UE support with balance prn with CGA in addition for balance recovery  Standing tap ups to 1st step 5 reps each leg without UE support on rail;  pt performed tap ups to 2nd step with LLE 5 reps and then to 3rd step LLE 5 reps to strengthen Lt hip flexors and to improve SLS on each leg    05-19-22 Added standing on pillow or blankets at home with EO and EC to HEP MedbridgeAccess Code: [email or web address] URL: [email or web address] Date: 05/20/2022 Prepared by: Maebelle Munroe  Exercises - Standing Balance with Eyes Closed on Foam  - 1 x  daily - 7 x weekly - 3 sets - 10 reps   Medbridge HEP - 05-14-22:   Access Code: [email or web address] URL: [email or web address] Date: 05/15/2022 Prepared by: Maebelle Munroe  Exercises - Single Leg Stance with Support  - 1 x daily - 7 x weekly - 1 sets - 2-3 reps - 10 sec hold    Pt instructed in HEP for LLE strengthening; Medbridge Access Code: [email or web address] URL: [email or web address] Date: 04/21/2022 Prepared by: Maebelle Munroe  Exercises - Sit to Stand  - 1 x daily - 7 x weekly - 1 sets - 10 reps - Clamshell with Resistance  - 1 x daily - 7 x weekly - 1 sets - 10 reps - 3 sec hold - Standing Hip Abduction with Resistance at Ankles and Counter Support  - 1 x daily - 7 x  weekly - 1 sets - 10 reps - Standing Hip Extension with Resistance at Ankles and Unilateral Counter Support  - 1 x daily - 7 x weekly - 1 sets - 10 reps - Standing Hip Flexion with Resistance Loop  - 1 x daily - 7 x weekly - 1 sets - 10 reps - 3 sec hold - Standing March with Counter Support  - 1 x daily - 7 x weekly - 1 sets - 10 reps - 3 hold - Standing Gastroc Stretch at Counter  - 1 x daily - 7 x weekly - 1 sets - 1 reps - 30 sec hold - Standing Bilateral Gastroc Stretch with Step  - 1 x daily - 7 x weekly - 1 sets - 1 reps - 20-30 hold   Access Code: [facility] URL: [email or web address] Date: 04/17/2022 Prepared by: Maebelle Munroe  Exercises - Sidelying Hip Abduction  - 1 x daily - 7 x weekly - 3 sets - 10 reps - Standing Heel Raise with Support  - 1 x daily - 7 x weekly - 3 sets - 10 reps - Single Leg Heel Raise  - 1 x daily - 7 x weekly - 3 sets - 10 reps - Heel Toe Raises with Counter Support  - 1 x daily - 7 x weekly - 3 sets - 10 reps   PATIENT EDUCATION: 05-14-22 Education details: added SLS to HEP  Person educated: Patient and Child(ren) Education method: Explanation, Demonstration, and Handouts Education comprehension: verbalized understanding and returned  demonstration  HOME EXERCISE PROGRAM: Medbridge HEP  [email or web address]   GOALS: Goals reviewed with patient? Yes  SHORT TERM GOALS: Same as LTG's   LONG TERM GOALS: Target date: 05-15-22  Perform 5x sit to stand transfers in </= 15 secs to demo increased LE strength and improved balance upon initial standing.  Baseline: 19.5 secs (4 reps); 17.75; 05-12-22  12.60 secs Goal status: Goal met 05-12-22  2.  Improve TUG score to </= 12.5 secs to reduce fall risk and demo improved functional mobility. Baseline:  13.5 secs with no device;  9.94 secs Goal status: Goal met 05-12-22  3.  Increase gait speed to >/= 3.2 ft/sec without device for increased gait efficiency. Baseline: 11.78 secs = 2.78 ft/sec no device;   05-12-22:  10.88, 10.31 = 3.18 ft/sec without device Goal status: Goal met 05-12-22  4.  Amb.  500' on flat, even surface without LOB and without Lt foot catching for improved safety with community ambulation/accessibility. Baseline:  Goal status: Goal met - 35' with no device   5.  Independent in HEP for LLE strengthening and balance. Baseline:  Goal status: Goal met 05-12-22  UPDATED LTG's:  TARGET DATE 06-19-22              1.  Perform 5x sit to stand transfers in </= 10 secs to demo increased LE strength and improved balance upon initial standing.  Baseline: 19.5 secs (4 reps); 17.75; 05-12-22  12.60 secs:  15.28 secs without UE support from high/low mat table Goal status: Ongoing as not met - 06-25-22  2.  Increase gait speed to >/= 3.5 ft/sec without device for increased gait efficiency. Baseline: 11.78 secs = 2.78 ft/sec no device;   05-12-22:  10.88, 10.31 = 3.18 ft/sec without device Goal status: Ongoing  3.  Amb.  700' (6 laps)  on flat, even surface without LOB and without Lt foot catching for improved safety with community ambulation/accessibility.  Baseline: 44' with no device  with cues to increase initial heel contact to decrease shuffling   Goal status:  Ongoing  4.  Transfer  floor to stand with UE support on object with supervision.  Baseline:  TBA  Goal status:  Ongoing  5.  Independent in updated HEP for balance and strengthening exercises.              Baseline:  TBA             Goal status:  Ongoing  UPDATED LTG's:  TARGET DATE 07-24-22              1.  Perform 5x sit to stand transfers in </= 10 secs to demo increased LE strength and improved balance upon initial standing.  Baseline: 19.5 secs (4 reps); 17.75; 05-12-22  12.60 secs:  15.28 secs without UE support from high/low mat table Goal status: Ongoing as not met - 06-25-22  2.  Increase gait speed to >/= 3.5 ft/sec without device for increased gait efficiency. Baseline: 11.78 secs = 2.78 ft/sec no device;   05-12-22:  10.88, 10.31 = 3.18 ft/sec without device Goal status: Ongoing  3.  Amb.  700' (6 laps)  on flat, even surface without LOB and without Lt foot catching for improved safety with community ambulation/accessibility.  Baseline: 16' with no device with cues to increase initial heel contact to decrease shuffling   Goal status:  Ongoing  4.  Transfer floor to stand with UE support on object with supervision.  Baseline:  TBA  Goal status:  Ongoing  5.  Independent in updated HEP for balance and strengthening exercises.              Baseline:  TBA             Goal status:  Ongoing   ASSESSMENT:  CLINICAL IMPRESSION: PT session focused on balance exercises to improve SLS on LLE and also strengthening exercises for LLE.  Pt had tremors in RLE with performing Lt hip exercises in standing, indicative of decreased muscle endurance.  Pt noted to have decreased step length RLE with slight shuffling gait at times but able to correct and increase step length of RLE with verbal cueing.  Cont with POC.     OBJECTIVE IMPAIRMENTS: Abnormal gait, decreased activity tolerance, decreased balance, decreased cognition, and decreased strength.   ACTIVITY LIMITATIONS: bending, squatting, stairs, transfers,  and locomotion level  PARTICIPATION LIMITATIONS: cleaning, laundry, driving, shopping, community activity, and yard work  PERSONAL FACTORS: Fitness, Past/current experiences, and 1 comorbidity: carotid stenosis (dizziness)  are also affecting patient's functional outcome.   REHAB POTENTIAL: Good  CLINICAL DECISION MAKING: Stable/uncomplicated  EVALUATION COMPLEXITY: Low  PLAN:  PT FREQUENCY: 2x/week   PT DURATION: 4 weeks  PLANNED INTERVENTIONS: Therapeutic exercises, Therapeutic activity, Neuromuscular re-education, Balance training, Gait training, Patient/Family education, Self Care, Stair training, and DME instructions  PLAN FOR NEXT SESSION: Cont. Balance, strengthening and gait; SLS on each leg   Cheyenne Bordeaux, Donavan Burnet, PT 07/03/2022, 1:12 PM

## 2022-07-03 ENCOUNTER — Encounter: Payer: Self-pay | Admitting: Physical Therapy

## 2022-07-03 NOTE — Progress Notes (Signed)
HEART & VASCULAR TRANSITION OF CARE CONSULT NOTE     Referring Physician: Dr. Tenny Craw Primary Care: Patient, No Pcp Per Primary Cardiologist: Dr. Tenny Craw  HPI: Referred to clinic by Dr. Margo Aye, Internal Medicine for heart failure consultation.   William Matthews is a 72 y.o. male with past medial history of CAD (s/p CABG and stenting), systolic CHF with recovered EF, HTN, CKD III, CVA and atrial fibrillation.  Admitted 2/24 for concern for CVA. MRI negative for CVA, but concern for symptomatic ICA stenosis. Echo showed EF 50-55%, RV ok. He was discharged with [facility] follow up.  Follow up with Dr. Edilia Bo with [facility]. Carotid dopplers showed < 39% bilateral stenosis. CT angio neck showed mild stenosis in R ICA.    Admitted 06/19/22 with CP and atrial fibrillation. BP elevated (200 systolic), mild volume overload 2/2 to medication noncompliance. Diuresed with IV lasix. HsTroponin flat and CP felt to be MSK. He was in rate controlled atrial fibrillation, planning for outpatient DCCV. GDMT titrated and he was discharged home, weight 274 lbs.  Today he presents to [facility] for post hospital HF follow up. Here w/ wife. Wt unchanged since d/c but he has mild edema on exam and continues w/ exertional dyspnea, NYHA Class II-early III. ReDs 36%.  Abdomen distended. Reports compliance w/ medications. EKG shows he is back in NSR, HR 64 bpm. BP controlled.    Cardiac Testing   - Echo (2/24): EF 50-55%, RV ok  - Echo (11/17): EF 40-45%  Review of Systems: [y] = yes, [ ]  = no   General: Weight gain [ ] ; Weight loss [ ] ; Anorexia [ ] ; Fatigue [ ] ; Fever [ ] ; Chills [ ] ; Weakness [ ]   Cardiac: Chest pain/pressure [ ] ; Resting SOB [ ] ; Exertional SOB [ Y]; Orthopnea [ ] ; Pedal Edema [Y ]; Palpitations [ ] ; Syncope [ ] ; Presyncope [ ] ; Paroxysmal nocturnal dyspnea[ ]   Pulmonary: Cough [ ] ; Wheezing[ ] ; Hemoptysis[ ] ; Sputum [ ] ; Snoring [ Y]  GI: Vomiting[ ] ; Dysphagia[ ] ; Melena[ ] ; Hematochezia [ ] ; Heartburn[ ] ;  Abdominal pain [ ] ; Constipation [ ] ; Diarrhea [ ] ; BRBPR [ ]   GU: Hematuria[ ] ; Dysuria [ ] ; Nocturia[ ]   Vascular: Pain in legs with walking [ ] ; Pain in feet with lying flat [ ] ; Non-healing sores [ ] ; Stroke [ ] ; TIA [ ] ; Slurred speech [ ] ;  Neuro: Headaches[ ] ; Vertigo[ ] ; Seizures[ ] ; Paresthesias[ ] ;Blurred vision [ ] ; Diplopia [ ] ; Vision changes [ ]   Ortho/Skin: Arthritis [ ] ; Joint pain [ ] ; Muscle pain [ ] ; Joint swelling [ ] ; Back Pain [ ] ; Rash [ ]   Psych: Depression[ ] ; Anxiety[ ]   Heme: Bleeding problems [ ] ; Clotting disorders [ ] ; Anemia [ ]   Endocrine: Diabetes [ ] ; Thyroid dysfunction[ ]    Past Medical History:  Diagnosis Date   Anxiety    CAD (coronary artery disease)    a. s/p CABG 1994 with LIMA to D2 and LAD, SVG to D1 and ramus intermedius. b. acute inferior MI 2001 s/p rescue PTCA/stent to [facility].  b. NSTEMI 02/2014: s/p LHC with DES to oLCx c. 09/2015 PCI with DES to left main/ostial LCx   Chronic bronchitis (HCC)    "they say I get it q yr" (10/03/2015)   CKD (chronic kidney disease), stage II    Depression    ED (erectile dysfunction)    GERD (gastroesophageal reflux disease)    Hyperlipidemia    Hypertension    Ischemic  cardiomyopathy    a. EF 48% by nuc in 2012.   Myocardial infarction [facility]) "several"   Nephrolithiasis    Obesity    Prediabetes     Current Outpatient Medications  Medication Sig Dispense Refill   amiodarone (PACERONE) 200 MG tablet Take 100 mg by mouth daily.     amLODipine (NORVASC) 5 MG tablet Take 1 tablet (5 mg total) by mouth daily. 30 tablet 0   apixaban (ELIQUIS) 2.5 MG TABS tablet Take 5 mg by mouth daily.     atorvastatin (LIPITOR) 20 MG tablet Take 2 tablets (40 mg total) by mouth at bedtime. 30 tablet 0   carvedilol (COREG) 12.5 MG tablet Take 1 tablet (12.5 mg total) by mouth 2 (two) times daily with a meal. 60 tablet 0   esomeprazole (NEXIUM) 40 MG capsule Take 40 mg by mouth daily.     furosemide (LASIX) 40 MG tablet Take  1 tablet (40 mg total) by mouth daily. 90 tablet 3   lidocaine (LIDODERM) 5 % Place 1 patch onto the skin daily. Remove & Discard patch within 12 hours or as directed by MD 30 patch 0   mirabegron ER (MYRBETRIQ) 50 MG TB24 tablet Take 50 mg by mouth at bedtime.     No current facility-administered medications for this encounter.   Allergies  Allergen Reactions   Ciprofloxacin Hcl Other (See Comments)    Unknown    Peanut-Containing Drug Products Other (See Comments)    Unknown    Social History   Socioeconomic History   Marital status: Divorced    Spouse name: Not on file   Number of children: 3   Years of education: Not on file   Highest education level: Not on file  Occupational History   Occupation: Retired  Tobacco Use   Smoking status: Never   Smokeless tobacco: Never  Vaping Use   Vaping Use: Never used  Substance and Sexual Activity   Alcohol use: No   Drug use: No   Sexual activity: Yes  Other Topics Concern   Not on file  Social History Narrative   Twenty years ago CAD and CABG,no chest pain since per patient but was eval., In ED for chest pain 1 month prev.-MI  r/o but no stress done. Took meds x1 month after MI but caused ED so stopped them had testosterone check >199,wants cialis or testosterone replacement. "if i can't have sex,life isn't worth living".         Right Handed    Lives in a one story home   Social Determinants of Health   Financial Resource Strain: Low Risk  (06/19/2022)   Overall Financial Resource Strain (CARDIA)    Difficulty of Paying Living Expenses: Not very hard  Food Insecurity: No Food Insecurity (06/19/2022)   Hunger Vital Sign    Worried About Running Out of Food in the Last Year: Never true    Ran Out of Food in the Last Year: Never true  Transportation Needs: No Transportation Needs (06/19/2022)   PRAPARE - Administrator, Civil Service (Medical): No    Lack of Transportation (Non-Medical): No  Physical Activity:  Not on file  Stress: Not on file  Social Connections: Not on file  Intimate Partner Violence: Not At Risk (06/19/2022)   Humiliation, Afraid, Rape, and Kick questionnaire    Fear of Current or Ex-Partner: No    Emotionally Abused: No    Physically Abused: No    Sexually Abused: No  Family History  Problem Relation Age of Onset   Coronary artery disease Mother        Died at age 47 of MI   Coronary artery disease Father        MI age 50    Vitals:   07/09/22 1151  BP: 132/86  Pulse: 68  SpO2: 95%  Weight: 124.3 kg (274 lb)    PHYSICAL EXAM: ReDs 36%  General:  NAD. No resp difficulty HEENT: Normal Neck: Supple. JVD 7-8 cm. Carotids 2+ bilat; no bruits. No lymphadenopathy or thryomegaly appreciated. Cor: PMI nondisplaced. Regular rate & rhythm. No rubs, gallops or murmurs. Lungs: Clear Abdomen: Soft, nontender, +distended. No hepatosplenomegaly. No bruits or masses. Good bowel sounds. Extremities: No cyanosis, clubbing, rash, trace-1+ b/l pretibial edema Neuro: Alert & oriented x 3, cranial nerves grossly intact. Moves all 4 extremities w/o difficulty. Affect pleasant.  ECG (personally reviewed): NSR 64 bpm    ASSESSMENT & PLAN: 1. HFpEF  - Echo (2/24): EF 50-55%, RV ok - NYHA II-early III. Mild volume overload on exam. ReDs 36%  - Continue Coreg 12.5 mg bid - Continue Lasix 40 mg daily. - Add Farxiga 10 mg daily  - Check BMP and BNP today and again in 7 days  - discussed low salt diet   2. CAD - s/p CABG - No chest pain - Continue statin - Continue beta blocker - No ASA with AC  3. PAF - In NSR on EKG today, HR 60s  - Continue Eliquis 5 mg bid - Continue amiodarone 100 mg daily. - OSA suspected. Recommend home sleep study. Gen cards to refer   4. CKD III - Baseline SCr 1.4 - Starting SGLT2i  - BMP today and again in 7 days   5. HTN - BP controlled - GDMT as above  6. ICA stenosis - Followed by [facility] - on statin   7. Obesity  - Body mass  index is 39.31 kg/m. - w/ HFpEF, prediabetes and suspected OSA, he needs wt loss reduction  - recommend referral to pharmD for GLP1   Referred to HFSW (PCP, Medications, Transportation, ETOH Abuse, Drug Abuse, Insurance, Financial ): No  Refer to Pharmacy:  No Refer to Home Health: No Refer to Advanced Heart Failure Clinic: No  Refer to General Cardiology: Yes (already followed by Dr. Tenny Craw)   Follow up with general cardiology, as scheduled in 2 wks   Ancel Easler Sharol Harness, PA-C  07/09/22

## 2022-07-07 ENCOUNTER — Encounter: Payer: Self-pay | Admitting: Physical Therapy

## 2022-07-07 ENCOUNTER — Ambulatory Visit: Payer: No Typology Code available for payment source | Admitting: Physical Therapy

## 2022-07-07 DIAGNOSIS — R2689 Other abnormalities of gait and mobility: Secondary | ICD-10-CM

## 2022-07-07 DIAGNOSIS — M6281 Muscle weakness (generalized): Secondary | ICD-10-CM

## 2022-07-07 DIAGNOSIS — R2681 Unsteadiness on feet: Secondary | ICD-10-CM

## 2022-07-07 NOTE — Therapy (Signed)
OUTPATIENT PHYSICAL THERAPY NEURO TREATMENT NOTE       Patient Name: William Matthews MRN: [id number] DOB:14-Nov-1950, 72 y.o., male Today's Date: 07/08/2022   PCP: Loura Back, NP REFERRING PROVIDER: Osvaldo Shipper, MD  END OF SESSION:  PT End of Session - 07/07/22 1103     Visit Number 15    Number of Visits 21   renewal completed for 8 additional visits   Date for PT Re-Evaluation 07/24/22    Authorization Type [employer]    Authorization Time Period 04-16-22 - 06-09-22;  05-14-22 - 07-10-22;  06-25-22 - 07-24-22    PT Start Time 1018    PT Stop Time 1100    PT Time Calculation (min) 42 min    Equipment Utilized During Treatment Gait belt   no gait belt used due to bruised ribs on Rt side   Activity Tolerance Patient tolerated treatment well    Behavior During Therapy WFL for tasks assessed/performed                           Past Medical History:  Diagnosis Date   Anxiety    CAD (coronary artery disease)    a. s/p CABG 1994 with LIMA to D2 and LAD, SVG to D1 and ramus intermedius. b. acute inferior MI 2001 s/p rescue PTCA/stent to [facility].  b. NSTEMI 02/2014: s/p LHC with DES to oLCx c. 09/2015 PCI with DES to left main/ostial LCx   Chronic bronchitis (HCC)    "they say I get it q yr" (10/03/2015)   CKD (chronic kidney disease), stage II    Depression    ED (erectile dysfunction)    GERD (gastroesophageal reflux disease)    Hyperlipidemia    Hypertension    Ischemic cardiomyopathy    a. EF 48% by nuc in 2012.   Myocardial infarction [facility]) "several"   Nephrolithiasis    Obesity    Prediabetes    Past Surgical History:  Procedure Laterality Date   CARDIAC CATHETERIZATION N/A 02/19/2015   Procedure: Left Heart Cath and Cors/Grafts Angiography;  Surgeon: Kathleene Hazel, MD;  LAD & RCA 100%, LIMA-LAD 50%, SVG-OM-D1 100% stenosis, distal limb D1-OM ok, CFX 99%   CARDIAC CATHETERIZATION  02/19/2015   Procedure: Coronary Stent Intervention;  Surgeon:  Kathleene Hazel, MD; 3.5 x 16 mm Promus Premier DES to the ostial CFX    CARDIAC CATHETERIZATION N/A 10/03/2015   Procedure: Left Heart Cath and Cors/Grafts Angiography;  Surgeon: Peter M Swaziland, MD;  Location: [facility] INVASIVE CV LAB;  Service: Cardiovascular;  Laterality: N/A;   CARDIAC CATHETERIZATION N/A 10/03/2015   Procedure: Coronary Stent Intervention;  Surgeon: Peter M Swaziland, MD;  Location: [facility] INVASIVE CV LAB;  Service: Cardiovascular;  Laterality: N/A;  DES- Resolute 4.0x15 to distal left main into the ostial circumflex artery   CARDIAC CATHETERIZATION N/A 10/03/2015   Procedure: Intravascular Ultrasound/IVUS;  Surgeon: Peter M Swaziland, MD;  Location: [facility] INVASIVE CV LAB;  Service: Cardiovascular;  Laterality: N/A;   CORONARY ANGIOPLASTY WITH STENT PLACEMENT  1994 - 2001 X 5   "stents each time"   CORONARY ANGIOPLASTY WITH STENT PLACEMENT  10/03/2015   CORONARY ARTERY BYPASS GRAFT  1994   LIMA-D2-LAD, SVG-D1-OM2   CYSTOSCOPY W/ STONE MANIPULATION     Patient Active Problem List   Diagnosis Date Noted   Acute CHF (congestive heart failure) (HCC) 06/19/2022   Slurred speech 03/26/2022   Chronic kidney disease, stage 3a (HCC) 03/26/2022  Hypothyroidism 03/26/2022   Atrial fibrillation (HCC) 03/26/2022   Heart failure with mildly reduced ejection fraction (HFmrEF) (HCC) 03/26/2022   Pain due to onychomycosis of toenails of both feet 08/23/2020   BPH with obstruction/lower urinary tract symptoms 12/18/2019   Anticoagulant long-term use 09/07/2019   Acute on chronic systolic congestive heart failure (HCC) 09/07/2019   Mitral regurgitation 09/07/2019   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   Atrial flutter (HCC) 08/15/2019   Unstable angina (HCC) 10/03/2015   NSTEMI (non-ST elevated myocardial infarction) (HCC) 02/20/2015   Hyperlipidemia    Prediabetes    Obesity    ED (erectile dysfunction)    CAD (coronary artery disease)    CKD (chronic kidney disease), stage II  02/18/2015   Nonspecific abnormal unspecified cardiovascular function study 08/11/2010   Unstable angina pectoris (HCC) 07/02/2010   Essential hypertension 07/02/2010   Erectile dysfunction 07/02/2010    ONSET DATE: 03-26-22  REFERRING DIAG:  Diagnosis  G45.9 (ICD-10-CM) - TIA (transient ischemic attack)    THERAPY DIAG:  Muscle weakness (generalized)  Unsteadiness on feet  Other abnormalities of gait and mobility  Rationale for Evaluation and Treatment: Rehabilitation  SUBJECTIVE:                                                                                                                                                                                             SUBJECTIVE STATEMENT: Pt reports he helped his son do some yardwork over the weekend and feels he is now walking better since he has increased his activity; pt still is not doing HEP at home - increasing activity by doing more functional activities  Pt accompanied by: self  PERTINENT HISTORY:  72 y.o. male with medical history significant for PAF on Eliquis, CAD s/p CABG and DES, CHF, CKD stage IIIa, HTN, HLD, hypothyroidism, BPH who presented to the ED for evaluation of left facial droop and slurred speech. Patient states he initially noted left-sided facial droop beginning at the end of December associated with slurred speech.  These changes resolved the next day.  Since then he has had intermittent episodes of dizziness without syncope.  Patient was hospitalized for further management. (2-15 - 03-28-22)    PAIN:  Are you having pain? No pain  Relieving factors:  Lidocaine patch helps Aggravating factors: no specific  PRECAUTIONS: intermittent dizziness due to stenosis: Fall   WEIGHT BEARING RESTRICTIONS: No  FALLS: Has patient fallen in last 6 months? Yes. Number of falls approx. 15  LIVING ENVIRONMENT: Lives with: lives alone Lives in: House/apartment Stairs: No Has following equipment at home:  None  PLOF: Independent  PATIENT  GOALS: Improve walking and balance    TODAY'S TREATMENT:         There were no vitals filed for this visit.                                                                                                                           DATE: 07-07-22  GAIT: Gait pattern: step through pattern, decreased ankle dorsiflexion- Left, and Left foot flat Distance walked: 115' Assistive device utilized: None Level of assistance: SBA Comments:  noted increased step length of RLE with improved clearance/less shuffling of Rt noted in today's session compared to that in previous PT session last Thursday   Pt performed ambulation forwards 35' x 1 rep tossing and catching ball for improved multi-tasking with gait; amb. Backwards 20' x 1 tossing and catching ball straight up 35' x 1 rep tossing and catching ball on Rt/Lt sides for improved balance with head turns and turning   THEREX:  Pt performed sit to stand from mat table 5 times without UE support - feet on Airex  Leg press 60# 3 sets 10 reps Step up exercise LLE 10 reps onto 1st step (6") with bil. UE support - cues to not pull up but to push up using legs Standing hip exercises -3# weight used - Lt hip abduction and flexion 10 reps each Bil. Heel raises 10 reps bil. LE's with bil. UE support   NeuroRe-ed:  Pt performed SLS activity - touching 3 colored discs on floor - in various order - to improve SLS on each leg and to improve coordination of LLE;   Standing tap ups to 1st step 5 reps each leg without UE support on rail;  pt performed tap ups to 2nd step with LLE 10 reps with 1 UE support on hand rail    05-19-22 Added standing on pillow or blankets at home with EO and EC to HEP MedbridgeAccess Code: [email or web address] URL: [email or web address] Date: 05/20/2022 Prepared by: Maebelle Munroe  Exercises - Standing Balance with Eyes Closed on Foam  - 1 x daily - 7 x weekly - 3 sets - 10  reps   Medbridge HEP - 05-14-22:   Access Code: [email or web address] URL: [email or web address] Date: 05/15/2022 Prepared by: Maebelle Munroe  Exercises - Single Leg Stance with Support  - 1 x daily - 7 x weekly - 1 sets - 2-3 reps - 10 sec hold    Pt instructed in HEP for LLE strengthening; Medbridge Access Code: [email or web address] URL: [email or web address] Date: 04/21/2022 Prepared by: Maebelle Munroe  Exercises - Sit to Stand  - 1 x daily - 7 x weekly - 1 sets - 10 reps - Clamshell with Resistance  - 1 x daily - 7 x weekly - 1 sets - 10 reps - 3 sec hold - Standing Hip Abduction with Resistance at Ankles and Counter Support  - 1 x daily - 7 x weekly - 1 sets - 10 reps - Standing Hip Extension with Resistance  at Ankles and Unilateral Counter Support  - 1 x daily - 7 x weekly - 1 sets - 10 reps - Standing Hip Flexion with Resistance Loop  - 1 x daily - 7 x weekly - 1 sets - 10 reps - 3 sec hold - Standing March with Counter Support  - 1 x daily - 7 x weekly - 1 sets - 10 reps - 3 hold - Standing Gastroc Stretch at Counter  - 1 x daily - 7 x weekly - 1 sets - 1 reps - 30 sec hold - Standing Bilateral Gastroc Stretch with Step  - 1 x daily - 7 x weekly - 1 sets - 1 reps - 20-30 hold   Access Code: [facility] URL: [email or web address] Date: 04/17/2022 Prepared by: Maebelle Munroe  Exercises - Sidelying Hip Abduction  - 1 x daily - 7 x weekly - 3 sets - 10 reps - Standing Heel Raise with Support  - 1 x daily - 7 x weekly - 3 sets - 10 reps - Single Leg Heel Raise  - 1 x daily - 7 x weekly - 3 sets - 10 reps - Heel Toe Raises with Counter Support  - 1 x daily - 7 x weekly - 3 sets - 10 reps   PATIENT EDUCATION: 05-14-22 Education details: added SLS to HEP  Person educated: Patient and Child(ren) Education method: Explanation, Demonstration, and Handouts Education comprehension: verbalized understanding and returned demonstration  HOME EXERCISE PROGRAM: Medbridge  HEP  [email or web address]   GOALS: Goals reviewed with patient? Yes  SHORT TERM GOALS: Same as LTG's   LONG TERM GOALS: Target date: 05-15-22  Perform 5x sit to stand transfers in </= 15 secs to demo increased LE strength and improved balance upon initial standing.  Baseline: 19.5 secs (4 reps); 17.75; 05-12-22  12.60 secs Goal status: Goal met 05-12-22  2.  Improve TUG score to </= 12.5 secs to reduce fall risk and demo improved functional mobility. Baseline:  13.5 secs with no device;  9.94 secs Goal status: Goal met 05-12-22  3.  Increase gait speed to >/= 3.2 ft/sec without device for increased gait efficiency. Baseline: 11.78 secs = 2.78 ft/sec no device;   05-12-22:  10.88, 10.31 = 3.18 ft/sec without device Goal status: Goal met 05-12-22  4.  Amb.  500' on flat, even surface without LOB and without Lt foot catching for improved safety with community ambulation/accessibility. Baseline:  Goal status: Goal met - 32' with no device   5.  Independent in HEP for LLE strengthening and balance. Baseline:  Goal status: Goal met 05-12-22  UPDATED LTG's:  TARGET DATE 06-19-22              1.  Perform 5x sit to stand transfers in </= 10 secs to demo increased LE strength and improved balance upon initial standing.  Baseline: 19.5 secs (4 reps); 17.75; 05-12-22  12.60 secs:  15.28 secs without UE support from high/low mat table Goal status: Ongoing as not met - 06-25-22  2.  Increase gait speed to >/= 3.5 ft/sec without device for increased gait efficiency. Baseline: 11.78 secs = 2.78 ft/sec no device;   05-12-22:  10.88, 10.31 = 3.18 ft/sec without device Goal status: Ongoing  3.  Amb.  700' (6 laps)  on flat, even surface without LOB and without Lt foot catching for improved safety with community ambulation/accessibility.  Baseline: 55' with no device with cues to increase initial heel contact to decrease shuffling   Goal  status:  Ongoing  4.  Transfer floor to stand with UE support on object with  supervision.  Baseline:  TBA  Goal status:  Ongoing  5.  Independent in updated HEP for balance and strengthening exercises.              Baseline:  TBA             Goal status:  Ongoing  UPDATED LTG's:  TARGET DATE 07-24-22              1.  Perform 5x sit to stand transfers in </= 10 secs to demo increased LE strength and improved balance upon initial standing.  Baseline: 19.5 secs (4 reps); 17.75; 05-12-22  12.60 secs:  15.28 secs without UE support from high/low mat table Goal status: Ongoing as not met - 06-25-22  2.  Increase gait speed to >/= 3.5 ft/sec without device for increased gait efficiency. Baseline: 11.78 secs = 2.78 ft/sec no device;   05-12-22:  10.88, 10.31 = 3.18 ft/sec without device Goal status: Ongoing  3.  Amb.  700' (6 laps)  on flat, even surface without LOB and without Lt foot catching for improved safety with community ambulation/accessibility.  Baseline: 36' with no device with cues to increase initial heel contact to decrease shuffling   Goal status:  Ongoing  4.  Transfer floor to stand with UE support on object with supervision.  Baseline:  TBA  Goal status:  Ongoing  5.  Independent in updated HEP for balance and strengthening exercises.              Baseline:  TBA             Goal status:  Ongoing   ASSESSMENT:  CLINICAL IMPRESSION: Pt noted to have improved gait pattern in today's session with increased Rt step length and less shuffling noted of RLE. Pt continues to have decreased single limb stance LLE > RLE with need for UE support for assist with recovery of LOB.  Cont with POC.     OBJECTIVE IMPAIRMENTS: Abnormal gait, decreased activity tolerance, decreased balance, decreased cognition, and decreased strength.   ACTIVITY LIMITATIONS: bending, squatting, stairs, transfers, and locomotion level  PARTICIPATION LIMITATIONS: cleaning, laundry, driving, shopping, community activity, and yard work  PERSONAL FACTORS: Fitness, Past/current  experiences, and 1 comorbidity: carotid stenosis (dizziness)  are also affecting patient's functional outcome.   REHAB POTENTIAL: Good  CLINICAL DECISION MAKING: Stable/uncomplicated  EVALUATION COMPLEXITY: Low  PLAN:  PT FREQUENCY: 2x/week   PT DURATION: 4 weeks  PLANNED INTERVENTIONS: Therapeutic exercises, Therapeutic activity, Neuromuscular re-education, Balance training, Gait training, Patient/Family education, Self Care, Stair training, and DME instructions  PLAN FOR NEXT SESSION: Cont. Balance, strengthening and gait; SLS on each leg   Elyse Prevo, Donavan Burnet, PT 07/08/2022, 6:43 PM

## 2022-07-08 NOTE — Progress Notes (Signed)
   [facility] Transitions of Care Clinic Heart Failure Pharmacist Encounter  PCP: Patient, No Pcp Per PCP-Cardiologist: None  HPI:   72 YO male with a PMH of proximal A-fib (on Eliquis), CAD status post CABG, CHF, CKD stage III, hypertension, hyperlipidemia, CVA.    Patient instructed to come to the ED from the [place] on 5/9 with chest pain and active atrial fibrillation. The patient also reported increasing dyspnea on exertion for ~1 week but denied orthopnea or worsening of lower extremity edema. TTE from 12/2015 shows an EF of 40-45%. Echo from 03/2022 shows an improved EF of 50-55%, moderate LVH, mild MVR, mild AVR, and RAP . CXR without significant edema. The patient admitted to some noncompliance with medications, including antihypertensives and missing one dose of Eliquis the week prior to admission. He reported not like taking his Lasix as well.  ___  Today, William Matthews presents to the Heart Failure TOC Clinic for follow up.   Shortness of breath/dyspnea on exertion? {YES NO:22349}  Orthopnea/PND? {YES NO:22349} Edema? {YES NO:22349} Lightheadedness/dizziness? {YES NO:22349} Daily weights at home? {YES NO:22349} Blood pressure/heart rate monitoring at home? {YES [id number] Following low-sodium/fluid-restricted diet? {YES NO:22349} Taking medications as prescribed? {YES NO:22349}  HF Medications: {HF MEDS CURRENT:26665}  Has the patient been experiencing any side effects to the medications prescribed?  {YES NO:22349}  Does the patient have any problems obtaining medications due to transportation or finances?   {YES NO:22349}  Understanding of regimen: {excellent/good/fair/poor:19665} Understanding of indications: {excellent/good/fair/poor:19665} Potential of compliance: {excellent/good/fair/poor:19665} Patient understands to avoid NSAIDs. Patient understands to avoid decongestants.   Pertinent Lab Values: Serum creatinine ***, BUN ***, Potassium ***,  Sodium ***, BNP ***, Magnesium ***, Digoxin ***   Vital Signs: Weight: *** lbs (discharge weight: *** lbs) Blood pressure: ***  Heart rate: ***   Medication Assistance / Insurance Benefits Check: Does the patient have prescription insurance?  {Yes/No/Pending:24180} Type of insurance plan: ***  Does the patient qualify for medication assistance through manufacturers or grants?   {Yes/No/Pending:24180} Eligible grants and/or patient assistance programs: *** Medication assistance applications in progress: ***  Medication assistance applications approved: *** Approved medication assistance renewals will be completed by: ***  Outpatient Pharmacy:  Current outpatient pharmacy: *** Was the [facility] pharmacy used to supply discharge medications? {YES NO:22349}  If TOC pharmacy was used, were the refills transferred out to current pharmacy yet? {YES NO:22349}  Is the patient willing to transition their outpatient pharmacy to utilize a [facility] outpatient pharmacy with or without mail order?   {Yes/No/Pending:24180}  Assessment: 1) Chronic ***systolic CHF (EF ***), due to ***. NYHA class *** symptoms. -   Plan: 1) Medication changes: -   2) Patient Assistance: -  3) Follow up: - Next appointment with *** on ***   Cherylin Mylar, PharmD PGY1 Pharmacy Resident 5/29/202410:57 PM

## 2022-07-09 ENCOUNTER — Ambulatory Visit (HOSPITAL_COMMUNITY)
Admit: 2022-07-09 | Discharge: 2022-07-09 | Disposition: A | Discharge status: Home / Self Care | Payer: No Typology Code available for payment source | Attending: Cardiology | Admitting: Cardiology

## 2022-07-09 ENCOUNTER — Other Ambulatory Visit (HOSPITAL_COMMUNITY): Payer: Self-pay

## 2022-07-09 ENCOUNTER — Ambulatory Visit: Payer: No Typology Code available for payment source | Admitting: Physical Therapy

## 2022-07-09 ENCOUNTER — Encounter (HOSPITAL_COMMUNITY): Payer: Self-pay

## 2022-07-09 VITALS — BP 132/86 | HR 68 | Wt 274.0 lb

## 2022-07-09 DIAGNOSIS — I130 Hypertensive heart and chronic kidney disease with heart failure and stage 1 through stage 4 chronic kidney disease, or unspecified chronic kidney disease: Secondary | ICD-10-CM | POA: Diagnosis not present

## 2022-07-09 DIAGNOSIS — I480 Paroxysmal atrial fibrillation: Secondary | ICD-10-CM | POA: Insufficient documentation

## 2022-07-09 DIAGNOSIS — I4892 Unspecified atrial flutter: Secondary | ICD-10-CM

## 2022-07-09 DIAGNOSIS — Z79899 Other long term (current) drug therapy: Secondary | ICD-10-CM | POA: Diagnosis not present

## 2022-07-09 DIAGNOSIS — N183 Chronic kidney disease, stage 3 unspecified: Secondary | ICD-10-CM | POA: Diagnosis not present

## 2022-07-09 DIAGNOSIS — Z6839 Body mass index (BMI) 39.0-39.9, adult: Secondary | ICD-10-CM | POA: Insufficient documentation

## 2022-07-09 DIAGNOSIS — I6523 Occlusion and stenosis of bilateral carotid arteries: Secondary | ICD-10-CM | POA: Insufficient documentation

## 2022-07-09 DIAGNOSIS — Z955 Presence of coronary angioplasty implant and graft: Secondary | ICD-10-CM | POA: Diagnosis not present

## 2022-07-09 DIAGNOSIS — I2510 Atherosclerotic heart disease of native coronary artery without angina pectoris: Secondary | ICD-10-CM | POA: Insufficient documentation

## 2022-07-09 DIAGNOSIS — R7303 Prediabetes: Secondary | ICD-10-CM | POA: Diagnosis not present

## 2022-07-09 DIAGNOSIS — I5042 Chronic combined systolic (congestive) and diastolic (congestive) heart failure: Secondary | ICD-10-CM | POA: Insufficient documentation

## 2022-07-09 DIAGNOSIS — Z951 Presence of aortocoronary bypass graft: Secondary | ICD-10-CM | POA: Diagnosis not present

## 2022-07-09 DIAGNOSIS — I252 Old myocardial infarction: Secondary | ICD-10-CM | POA: Insufficient documentation

## 2022-07-09 DIAGNOSIS — I5030 Unspecified diastolic (congestive) heart failure: Secondary | ICD-10-CM

## 2022-07-09 DIAGNOSIS — E669 Obesity, unspecified: Secondary | ICD-10-CM | POA: Insufficient documentation

## 2022-07-09 DIAGNOSIS — Z7901 Long term (current) use of anticoagulants: Secondary | ICD-10-CM | POA: Diagnosis not present

## 2022-07-09 DIAGNOSIS — M6281 Muscle weakness (generalized): Secondary | ICD-10-CM | POA: Diagnosis not present

## 2022-07-09 DIAGNOSIS — R2681 Unsteadiness on feet: Secondary | ICD-10-CM

## 2022-07-09 LAB — BRAIN NATRIURETIC PEPTIDE: B Natriuretic Peptide: 120.6 pg/mL — ABNORMAL HIGH (ref 0.0–100.0)

## 2022-07-09 LAB — BASIC METABOLIC PANEL
Anion gap: 8 (ref 5–15)
BUN: 16 mg/dL (ref 8–23)
CO2: 24 mmol/L (ref 22–32)
Calcium: 9 mg/dL (ref 8.9–10.3)
Chloride: 105 mmol/L (ref 98–111)
Creatinine, Ser: 1.4 mg/dL — ABNORMAL HIGH (ref 0.61–1.24)
GFR, Estimated: 53 mL/min — ABNORMAL LOW (ref >60)
Glucose, Bld: 112 mg/dL — ABNORMAL HIGH (ref 70–99)
Potassium: 4.3 mmol/L (ref 3.5–5.1)
Sodium: 137 mmol/L (ref 135–145)

## 2022-07-09 MED ORDER — APIXABAN 5 MG PO TABS: 5.0000 mg | ORAL_TABLET | Freq: Every day | ORAL | 11 refills | Status: AC

## 2022-07-09 MED ORDER — CARVEDILOL 12.5 MG PO TABS: 12.5000 mg | ORAL_TABLET | Freq: Two times a day (BID) | ORAL | 3 refills | Status: AC

## 2022-07-09 NOTE — Therapy (Signed)
OUTPATIENT PHYSICAL THERAPY NEURO TREATMENT NOTE       Patient Name: William Matthews MRN: [id number] DOB:08-14-1950, 72 y.o., male Today's Date: 07/10/2022   PCP: Loura Back, NP REFERRING PROVIDER: Osvaldo Shipper, MD  END OF SESSION:  PT End of Session - 07/10/22 1056     Visit Number 16    Number of Visits 21   renewal completed for 8 additional visits   Date for PT Re-Evaluation 07/24/22    Authorization Type [employer]    Authorization Time Period 04-16-22 - 06-09-22;  05-14-22 - 07-10-22;  06-25-22 - 07-24-22    PT Start Time 1017    PT Stop Time 1100    PT Time Calculation (min) 43 min    Equipment Utilized During Treatment Gait belt   no gait belt used due to bruised ribs on Rt side   Activity Tolerance Patient tolerated treatment well    Behavior During Therapy WFL for tasks assessed/performed                            Past Medical History:  Diagnosis Date   Anxiety    CAD (coronary artery disease)    a. s/p CABG 1994 with LIMA to D2 and LAD, SVG to D1 and ramus intermedius. b. acute inferior MI 2001 s/p rescue PTCA/stent to [facility].  b. NSTEMI 02/2014: s/p LHC with DES to oLCx c. 09/2015 PCI with DES to left main/ostial LCx   Chronic bronchitis (HCC)    "they say I get it q yr" (10/03/2015)   CKD (chronic kidney disease), stage II    Depression    ED (erectile dysfunction)    GERD (gastroesophageal reflux disease)    Hyperlipidemia    Hypertension    Ischemic cardiomyopathy    a. EF 48% by nuc in 2012.   Myocardial infarction [facility]) "several"   Nephrolithiasis    Obesity    Prediabetes    Past Surgical History:  Procedure Laterality Date   CARDIAC CATHETERIZATION N/A 02/19/2015   Procedure: Left Heart Cath and Cors/Grafts Angiography;  Surgeon: Kathleene Hazel, MD;  LAD & RCA 100%, LIMA-LAD 50%, SVG-OM-D1 100% stenosis, distal limb D1-OM ok, CFX 99%   CARDIAC CATHETERIZATION  02/19/2015   Procedure: Coronary Stent Intervention;  Surgeon:  Kathleene Hazel, MD; 3.5 x 16 mm Promus Premier DES to the ostial CFX    CARDIAC CATHETERIZATION N/A 10/03/2015   Procedure: Left Heart Cath and Cors/Grafts Angiography;  Surgeon: Peter M Swaziland, MD;  Location: [facility] INVASIVE CV LAB;  Service: Cardiovascular;  Laterality: N/A;   CARDIAC CATHETERIZATION N/A 10/03/2015   Procedure: Coronary Stent Intervention;  Surgeon: Peter M Swaziland, MD;  Location: [facility] INVASIVE CV LAB;  Service: Cardiovascular;  Laterality: N/A;  DES- Resolute 4.0x15 to distal left main into the ostial circumflex artery   CARDIAC CATHETERIZATION N/A 10/03/2015   Procedure: Intravascular Ultrasound/IVUS;  Surgeon: Peter M Swaziland, MD;  Location: [facility] INVASIVE CV LAB;  Service: Cardiovascular;  Laterality: N/A;   CORONARY ANGIOPLASTY WITH STENT PLACEMENT  1994 - 2001 X 5   "stents each time"   CORONARY ANGIOPLASTY WITH STENT PLACEMENT  10/03/2015   CORONARY ARTERY BYPASS GRAFT  1994   LIMA-D2-LAD, SVG-D1-OM2   CYSTOSCOPY W/ STONE MANIPULATION     Patient Active Problem List   Diagnosis Date Noted   Acute CHF (congestive heart failure) (HCC) 06/19/2022   Slurred speech 03/26/2022   Chronic kidney disease, stage 3a (HCC)  03/26/2022   Hypothyroidism 03/26/2022   Atrial fibrillation (HCC) 03/26/2022   Heart failure with mildly reduced ejection fraction (HFmrEF) (HCC) 03/26/2022   Pain due to onychomycosis of toenails of both feet 08/23/2020   BPH with obstruction/lower urinary tract symptoms 12/18/2019   Anticoagulant long-term use 09/07/2019   Acute on chronic systolic congestive heart failure (HCC) 09/07/2019   Mitral regurgitation 09/07/2019   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   Atrial flutter (HCC) 08/15/2019   Unstable angina (HCC) 10/03/2015   NSTEMI (non-ST elevated myocardial infarction) (HCC) 02/20/2015   Hyperlipidemia    Prediabetes    Obesity    ED (erectile dysfunction)    CAD (coronary artery disease)    CKD (chronic kidney disease), stage II  02/18/2015   Nonspecific abnormal unspecified cardiovascular function study 08/11/2010   Unstable angina pectoris (HCC) 07/02/2010   Essential hypertension 07/02/2010   Erectile dysfunction 07/02/2010    ONSET DATE: 03-26-22  REFERRING DIAG:  Diagnosis  G45.9 (ICD-10-CM) - TIA (transient ischemic attack)    THERAPY DIAG:  Muscle weakness (generalized)  Unsteadiness on feet  Rationale for Evaluation and Treatment: Rehabilitation  SUBJECTIVE:                                                                                                                                                                                             SUBJECTIVE STATEMENT: Pt reports no changes or problems; still has not found his wallet - asks if anyone turned it in here at the clinic; pt states he wants to work on strengthening exercises today more than balance  Pt accompanied by: self  PERTINENT HISTORY:  72 y.o. male with medical history significant for PAF on Eliquis, CAD s/p CABG and DES, CHF, CKD stage IIIa, HTN, HLD, hypothyroidism, BPH who presented to the ED for evaluation of left facial droop and slurred speech. Patient states he initially noted left-sided facial droop beginning at the end of December associated with slurred speech.  These changes resolved the next day.  Since then he has had intermittent episodes of dizziness without syncope.  Patient was hospitalized for further management. (2-15 - 03-28-22)    PAIN:  Are you having pain? No pain  Relieving factors:  Lidocaine patch helps Aggravating factors: no specific  PRECAUTIONS: intermittent dizziness due to stenosis: Fall   WEIGHT BEARING RESTRICTIONS: No  FALLS: Has patient fallen in last 6 months? Yes. Number of falls approx. 15  LIVING ENVIRONMENT: Lives with: lives alone Lives in: House/apartment Stairs: No Has following equipment at home: None  PLOF: Independent  PATIENT GOALS: Improve walking and balance    TODAY'S  TREATMENT:         There were no vitals filed for this visit.                                                                                                                           DATE: 07-09-22  GAIT: Gait pattern: step through pattern, decreased ankle dorsiflexion- Left, and Left foot flat Distance walked: 115' Assistive device utilized: None Level of assistance: SBA Comments:  noted increased step length of RLE with improved clearance/less shuffling of Rt noted in today's session compared to that in previous PT session last Thursday      THEREX:  Pt performed sit to stand from mat table 10 times without UE support - feet on Airex  Leg press 60# 3 sets 10 reps; 70# 3 sets 10 reps bil. LE's Step up exercise LLE 10 reps onto 1st step (6") with bil. UE support - cues to not pull up but to push up using legs Standing hip exercises -3# weight used - Lt hip abduction, extension and flexion 10 reps each leg with UE support on counter Bil. Heel raises 10 reps bil. LE's with bil. UE support; Rt and Lt unilateral heel raises 10 reps each leg with bil. UE support on counter  NeuroRe-ed:  Standing tap ups to 1st step 5 reps each leg without UE support on rail;  pt performed tap ups to 2nd step with each leg 10 reps each without UE support Standing marching on floor 10 reps each - cues to lift LLE higher (to approx. 80 degrees hip flexion)  05-19-22 Added standing on pillow or blankets at home with EO and EC to HEP MedbridgeAccess Code: [email or web address] URL: [email or web address] Date: 05/20/2022 Prepared by: Maebelle Munroe  Exercises - Standing Balance with Eyes Closed on Foam  - 1 x daily - 7 x weekly - 3 sets - 10 reps   Medbridge HEP - 05-14-22:   Access Code: [email or web address] URL: [email or web address] Date: 05/15/2022 Prepared by: Maebelle Munroe  Exercises - Single Leg Stance with Support  - 1 x daily - 7 x weekly - 1 sets - 2-3 reps - 10 sec hold    Pt  instructed in HEP for LLE strengthening; Medbridge Access Code: [email or web address] URL: [email or web address] Date: 04/21/2022 Prepared by: Maebelle Munroe  Exercises - Sit to Stand  - 1 x daily - 7 x weekly - 1 sets - 10 reps - Clamshell with Resistance  - 1 x daily - 7 x weekly - 1 sets - 10 reps - 3 sec hold - Standing Hip Abduction with Resistance at Ankles and Counter Support  - 1 x daily - 7 x weekly - 1 sets - 10 reps - Standing Hip Extension with Resistance at Ankles and Unilateral Counter Support  - 1 x daily - 7 x weekly - 1 sets - 10 reps - Standing Hip Flexion with Resistance Loop  - 1 x daily - 7 x weekly - 1 sets -  10 reps - 3 sec hold - Standing March with Counter Support  - 1 x daily - 7 x weekly - 1 sets - 10 reps - 3 hold - Standing Gastroc Stretch at Counter  - 1 x daily - 7 x weekly - 1 sets - 1 reps - 30 sec hold - Standing Bilateral Gastroc Stretch with Step  - 1 x daily - 7 x weekly - 1 sets - 1 reps - 20-30 hold   Access Code: [facility] URL: [email or web address] Date: 04/17/2022 Prepared by: Maebelle Munroe  Exercises - Sidelying Hip Abduction  - 1 x daily - 7 x weekly - 3 sets - 10 reps - Standing Heel Raise with Support  - 1 x daily - 7 x weekly - 3 sets - 10 reps - Single Leg Heel Raise  - 1 x daily - 7 x weekly - 3 sets - 10 reps - Heel Toe Raises with Counter Support  - 1 x daily - 7 x weekly - 3 sets - 10 reps   PATIENT EDUCATION: 05-14-22 Education details: added SLS to HEP  Person educated: Patient and Child(ren) Education method: Explanation, Demonstration, and Handouts Education comprehension: verbalized understanding and returned demonstration  HOME EXERCISE PROGRAM: Medbridge HEP  [email or web address]   GOALS: Goals reviewed with patient? Yes  SHORT TERM GOALS: Same as LTG's   LONG TERM GOALS: Target date: 05-15-22  Perform 5x sit to stand transfers in </= 15 secs to demo increased LE strength and improved balance upon initial standing.   Baseline: 19.5 secs (4 reps); 17.75; 05-12-22  12.60 secs Goal status: Goal met 05-12-22  2.  Improve TUG score to </= 12.5 secs to reduce fall risk and demo improved functional mobility. Baseline:  13.5 secs with no device;  9.94 secs Goal status: Goal met 05-12-22  3.  Increase gait speed to >/= 3.2 ft/sec without device for increased gait efficiency. Baseline: 11.78 secs = 2.78 ft/sec no device;   05-12-22:  10.88, 10.31 = 3.18 ft/sec without device Goal status: Goal met 05-12-22  4.  Amb.  500' on flat, even surface without LOB and without Lt foot catching for improved safety with community ambulation/accessibility. Baseline:  Goal status: Goal met - 35' with no device   5.  Independent in HEP for LLE strengthening and balance. Baseline:  Goal status: Goal met 05-12-22  UPDATED LTG's:  TARGET DATE 06-19-22              1.  Perform 5x sit to stand transfers in </= 10 secs to demo increased LE strength and improved balance upon initial standing.  Baseline: 19.5 secs (4 reps); 17.75; 05-12-22  12.60 secs:  15.28 secs without UE support from high/low mat table Goal status: Ongoing as not met - 06-25-22  2.  Increase gait speed to >/= 3.5 ft/sec without device for increased gait efficiency. Baseline: 11.78 secs = 2.78 ft/sec no device;   05-12-22:  10.88, 10.31 = 3.18 ft/sec without device Goal status: Ongoing  3.  Amb.  700' (6 laps)  on flat, even surface without LOB and without Lt foot catching for improved safety with community ambulation/accessibility.  Baseline: 28' with no device with cues to increase initial heel contact to decrease shuffling   Goal status:  Ongoing  4.  Transfer floor to stand with UE support on object with supervision.  Baseline:  TBA  Goal status:  Ongoing  5.  Independent in updated HEP for balance and strengthening exercises.  Baseline:  TBA             Goal status:  Ongoing  UPDATED LTG's:  TARGET DATE 07-24-22              1.  Perform 5x sit to  stand transfers in </= 10 secs to demo increased LE strength and improved balance upon initial standing.  Baseline: 19.5 secs (4 reps); 17.75; 05-12-22  12.60 secs:  15.28 secs without UE support from high/low mat table Goal status: Ongoing as not met - 06-25-22  2.  Increase gait speed to >/= 3.5 ft/sec without device for increased gait efficiency. Baseline: 11.78 secs = 2.78 ft/sec no device;   05-12-22:  10.88, 10.31 = 3.18 ft/sec without device Goal status: Ongoing  3.  Amb.  700' (6 laps)  on flat, even surface without LOB and without Lt foot catching for improved safety with community ambulation/accessibility.  Baseline: 68' with no device with cues to increase initial heel contact to decrease shuffling   Goal status:  Ongoing  4.  Transfer floor to stand with UE support on object with supervision.  Baseline:  TBA  Goal status:  Ongoing  5.  Independent in updated HEP for balance and strengthening exercises.              Baseline:  TBA             Goal status:  Ongoing   ASSESSMENT:  CLINICAL IMPRESSION: PT session focused on strengthening exercises for LLE primarily but also some for RLE as RLE has been noted to tremor in standing with SLS when pt performing exercises with LLE.  Pt able to increase weights on leg press from 60# to 70#, demonstrating increased strength bil. LE's.  Gait pattern remains improved with less shuffling LLE noted.  Cont with POC.     OBJECTIVE IMPAIRMENTS: Abnormal gait, decreased activity tolerance, decreased balance, decreased cognition, and decreased strength.   ACTIVITY LIMITATIONS: bending, squatting, stairs, transfers, and locomotion level  PARTICIPATION LIMITATIONS: cleaning, laundry, driving, shopping, community activity, and yard work  PERSONAL FACTORS: Fitness, Past/current experiences, and 1 comorbidity: carotid stenosis (dizziness)  are also affecting patient's functional outcome.   REHAB POTENTIAL: Good  CLINICAL DECISION MAKING:  Stable/uncomplicated  EVALUATION COMPLEXITY: Low  PLAN:  PT FREQUENCY: 2x/week   PT DURATION: 4 weeks  PLANNED INTERVENTIONS: Therapeutic exercises, Therapeutic activity, Neuromuscular re-education, Balance training, Gait training, Patient/Family education, Self Care, Stair training, and DME instructions  PLAN FOR NEXT SESSION: Cont. Balance, strengthening and gait; SLS on each leg   Dossie Swor, Donavan Burnet, PT 07/10/2022, 10:57 AM

## 2022-07-09 NOTE — Progress Notes (Signed)
ReDS Vest / Clip - 07/09/22 1200       ReDS Vest / Clip   Station Marker D    Ruler Value 41    ReDS Value Range Moderate volume overload    ReDS Actual Value 36    Anatomical Comments sitting

## 2022-07-09 NOTE — Patient Instructions (Addendum)
Restart Farxiga 10 mg daily   Labs done today, your results will be available in MyChart, we will contact you for abnormal readings.  Repeat blood work in 7 days.  Thank you for allowing [place] to provider your heart failure care after your recent hospitalization. Please follow-up with General Cardiology as scheduled.  If you have any questions, issues, or concerns before your next appointment please call our office at [phone], opt. [phone] and leave a message for the triage nurse.

## 2022-07-10 ENCOUNTER — Encounter: Payer: Self-pay | Admitting: Physical Therapy

## 2022-07-14 ENCOUNTER — Ambulatory Visit: Payer: Medicare HMO | Admitting: Physical Therapy

## 2022-07-16 ENCOUNTER — Other Ambulatory Visit (HOSPITAL_COMMUNITY): Payer: Medicare HMO

## 2022-07-17 ENCOUNTER — Ambulatory Visit (HOSPITAL_COMMUNITY)
Admission: RE | Admit: 2022-07-17 | Discharge: 2022-07-17 | Disposition: A | Discharge status: Home / Self Care | Payer: Medicare HMO | Source: Ambulatory Visit | Attending: Cardiology | Admitting: Cardiology

## 2022-07-17 DIAGNOSIS — I5030 Unspecified diastolic (congestive) heart failure: Secondary | ICD-10-CM

## 2022-07-17 DIAGNOSIS — I5022 Chronic systolic (congestive) heart failure: Secondary | ICD-10-CM | POA: Diagnosis not present

## 2022-07-17 LAB — BASIC METABOLIC PANEL
Anion gap: 12 (ref 5–15)
BUN: 17 mg/dL (ref 8–23)
CO2: 21 mmol/L — ABNORMAL LOW (ref 22–32)
Calcium: 9 mg/dL (ref 8.9–10.3)
Chloride: 103 mmol/L (ref 98–111)
Creatinine, Ser: 1.56 mg/dL — ABNORMAL HIGH (ref 0.61–1.24)
GFR, Estimated: 47 mL/min — ABNORMAL LOW (ref >60)
Glucose, Bld: 195 mg/dL — ABNORMAL HIGH (ref 70–99)
Potassium: 4 mmol/L (ref 3.5–5.1)
Sodium: 136 mmol/L (ref 135–145)

## 2022-07-17 LAB — BRAIN NATRIURETIC PEPTIDE: B Natriuretic Peptide: 75.7 pg/mL (ref 0.0–100.0)

## 2022-07-21 ENCOUNTER — Ambulatory Visit: Payer: Medicare HMO | Attending: Internal Medicine | Admitting: Physical Therapy

## 2022-07-21 ENCOUNTER — Ambulatory Visit: Payer: Medicare HMO | Admitting: Neurology

## 2022-07-21 DIAGNOSIS — R2681 Unsteadiness on feet: Secondary | ICD-10-CM | POA: Insufficient documentation

## 2022-07-21 DIAGNOSIS — M6281 Muscle weakness (generalized): Secondary | ICD-10-CM | POA: Insufficient documentation

## 2022-07-21 DIAGNOSIS — R2689 Other abnormalities of gait and mobility: Secondary | ICD-10-CM | POA: Diagnosis not present

## 2022-07-21 NOTE — Therapy (Unsigned)
OUTPATIENT PHYSICAL THERAPY NEURO TREATMENT NOTE/DISCHARGE SUMMARY       Patient Name: William Matthews MRN: [id number] DOB:[birth year], 72 y.o., male Today's Date: 07/22/2022   PCP: Loura Back, NP REFERRING PROVIDER: Osvaldo Shipper, MD  END OF SESSION:  PT End of Session - 07/22/22 1636     Visit Number 17    Number of Visits 21   renewal completed for 8 additional visits   Date for PT Re-Evaluation 07/24/22    Authorization Type [employer]    Authorization Time Period 04-16-22 - 06-09-22;  05-14-22 - 07-10-22;  06-25-22 - 07-24-22    PT Start Time 1018    PT Stop Time 1101    PT Time Calculation (min) 43 min    Equipment Utilized During Treatment Gait belt   no gait belt used due to bruised ribs on Rt side   Activity Tolerance Patient tolerated treatment well    Behavior During Therapy WFL for tasks assessed/performed                             Past Medical History:  Diagnosis Date   Anxiety    CAD (coronary artery disease)    a. s/p CABG 1994 with LIMA to D2 and LAD, SVG to D1 and ramus intermedius. b. acute inferior MI 2001 s/p rescue PTCA/stent to [employer]  b. NSTEMI 02/2014: s/p LHC with DES to oLCx c. 09/2015 PCI with DES to left main/ostial LCx   Chronic bronchitis (HCC)    "they say I get it q yr" (10/03/2015)   CKD (chronic kidney disease), stage II    Depression    ED (erectile dysfunction)    GERD (gastroesophageal reflux disease)    Hyperlipidemia    Hypertension    Ischemic cardiomyopathy    a. EF 48% by nuc in 2012.   Myocardial infarction [facility]) "several"   Nephrolithiasis    Obesity    Prediabetes    Past Surgical History:  Procedure Laterality Date   CARDIAC CATHETERIZATION N/A 02/19/2015   Procedure: Left Heart Cath and Cors/Grafts Angiography;  Surgeon: Kathleene Hazel, MD;  LAD & RCA 100%, LIMA-LAD 50%, SVG-OM-D1 100% stenosis, distal limb D1-OM ok, CFX 99%   CARDIAC CATHETERIZATION  02/19/2015   Procedure: Coronary Stent  Intervention;  Surgeon: Kathleene Hazel, MD; 3.5 x 16 mm Promus Premier DES to the ostial CFX    CARDIAC CATHETERIZATION N/A 10/03/2015   Procedure: Left Heart Cath and Cors/Grafts Angiography;  Surgeon: Peter M Swaziland, MD;  Location: [facility] INVASIVE CV LAB;  Service: Cardiovascular;  Laterality: N/A;   CARDIAC CATHETERIZATION N/A 10/03/2015   Procedure: Coronary Stent Intervention;  Surgeon: Peter M Swaziland, MD;  Location: [facility] INVASIVE CV LAB;  Service: Cardiovascular;  Laterality: N/A;  DES- Resolute 4.0x15 to distal left main into the ostial circumflex artery   CARDIAC CATHETERIZATION N/A 10/03/2015   Procedure: Intravascular Ultrasound/IVUS;  Surgeon: Peter M Swaziland, MD;  Location: [facility] INVASIVE CV LAB;  Service: Cardiovascular;  Laterality: N/A;   CORONARY ANGIOPLASTY WITH STENT PLACEMENT  1994 - 2001 X 5   "stents each time"   CORONARY ANGIOPLASTY WITH STENT PLACEMENT  10/03/2015   CORONARY ARTERY BYPASS GRAFT  1994   LIMA-D2-LAD, SVG-D1-OM2   CYSTOSCOPY W/ STONE MANIPULATION     Patient Active Problem List   Diagnosis Date Noted   Acute CHF (congestive heart failure) (HCC) 06/19/2022   Slurred speech 03/26/2022   Chronic kidney disease, stage  3a (HCC) 03/26/2022   Hypothyroidism 03/26/2022   Atrial fibrillation (HCC) 03/26/2022   Heart failure with mildly reduced ejection fraction (HFmrEF) (HCC) 03/26/2022   Pain due to onychomycosis of toenails of both feet 08/23/2020   BPH with obstruction/lower urinary tract symptoms 12/18/2019   Anticoagulant long-term use 09/07/2019   Acute on chronic systolic congestive heart failure (HCC) 09/07/2019   Mitral regurgitation 09/07/2019   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   Atrial flutter (HCC) 08/15/2019   Unstable angina (HCC) 10/03/2015   NSTEMI (non-ST elevated myocardial infarction) (HCC) 02/20/2015   Hyperlipidemia    Prediabetes    Obesity    ED (erectile dysfunction)    CAD (coronary artery disease)    CKD (chronic  kidney disease), stage II 02/18/2015   Nonspecific abnormal unspecified cardiovascular function study 08/11/2010   Unstable angina pectoris (HCC) 07/02/2010   Essential hypertension 07/02/2010   Erectile dysfunction 07/02/2010    ONSET DATE: 03-26-22  REFERRING DIAG:  Diagnosis  G45.9 (ICD-10-CM) - TIA (transient ischemic attack)    THERAPY DIAG:  Muscle weakness (generalized)  Other abnormalities of gait and mobility  Unsteadiness on feet  Rationale for Evaluation and Treatment: Rehabilitation  SUBJECTIVE:                                                                                                                                                                                             SUBJECTIVE STATEMENT: Pt reports he is doing well - no new problems or changes  Pt accompanied by: self  PERTINENT HISTORY:  72 y.o. male with medical history significant for PAF on Eliquis, CAD s/p CABG and DES, CHF, CKD stage IIIa, HTN, HLD, hypothyroidism, BPH who presented to the ED for evaluation of left facial droop and slurred speech. Patient states he initially noted left-sided facial droop beginning at the end of December associated with slurred speech.  These changes resolved the next day.  Since then he has had intermittent episodes of dizziness without syncope.  Patient was hospitalized for further management. (2-15 - 03-28-22)    PAIN:  Are you having pain? No pain  Relieving factors:  Lidocaine patch helps Aggravating factors: no specific  PRECAUTIONS: intermittent dizziness due to stenosis: Fall   WEIGHT BEARING RESTRICTIONS: No  FALLS: Has patient fallen in last 6 months? Yes. Number of falls approx. 15  LIVING ENVIRONMENT: Lives with: lives alone Lives in: House/apartment Stairs: No Has following equipment at home: None  PLOF: Independent  PATIENT GOALS: Improve walking and balance    TODAY'S TREATMENT:         There were no vitals filed for this  visit.                                                                                                                            DATE: 07-21-22  GAIT: Gait pattern: step through pattern, decreased ankle dorsiflexion- Left, and Left foot flat Distance walked: 115' x 5 laps = 575' (for LTG assessment) Assistive device utilized: None Level of assistance: SBA to CGA with occurrence of Rt foot catching floor  Comments:  pt has tendency to gain momentum and take shorter steps with fatigue; cues to stand erect and stop when he feels he is gaining momentum/speed; also cues to place Rt heel down first to facilitate improved gait pattern with less forefoot contact in initial stance; balance decreases with fatigue  Gait velocity = 11.59 secs = 2.83 ft/sec without device    THEREX:  Pt performed 5x sit to stand from mat table 12.94 secs without UE support:  from chair - no UE support used - 15.34 secs  Leg press 70# 1 set 10 reps bil. LE's; 80# 10 reps:  90# 10 reps  Bil. Heel raises 10 reps bil. LE's with bil. UE support; Rt and Lt unilateral heel raises 10 reps each leg with bil. UE support on counter  NeuroRe-ed:  Standing tap ups to 1st step 5 reps each leg without UE support on rail;  pt performed tap ups to 2nd step with each leg 5 reps each without UE support Performed touching balance bubbles with each foot - 3 arranged in half circle - pt performed 5 reps with each foot - touching each balance bubble; then diagonal touches to 2 balance bubbles for improved balance with crossing midline - pt performed 5 reps with each foot with UE support on counter prn - with CGA   Pt declined to perform floor to stand transfer - stating it was not necessary as he has done this transfer at home independently   Pt completed FOTO - stroke LE - score 47.4 at eval;  score 56.8 at today's session   05-19-22 Added standing on pillow or blankets at home with EO and EC to HEP MedbridgeAccess Code: [email or web address] URL:  [email or web address] Date: 05/20/2022 Prepared by: Maebelle Munroe  Exercises - Standing Balance with Eyes Closed on Foam  - 1 x daily - 7 x weekly - 3 sets - 10 reps   Medbridge HEP - 05-14-22:   Access Code: [email or web address] URL: [email or web address] Date: 05/15/2022 Prepared by: Maebelle Munroe  Exercises - Single Leg Stance with Support  - 1 x daily - 7 x weekly - 1 sets - 2-3 reps - 10 sec hold    Pt instructed in HEP for LLE strengthening; Medbridge Access Code: [email or web address] URL: [email or web address] Date: 04/21/2022 Prepared by: Maebelle Munroe  Exercises - Sit to Stand  - 1 x daily - 7 x weekly - 1 sets - 10 reps - Clamshell with Resistance  - 1 x daily - 7 x weekly - 1  sets - 10 reps - 3 sec hold - Standing Hip Abduction with Resistance at Ankles and Counter Support  - 1 x daily - 7 x weekly - 1 sets - 10 reps - Standing Hip Extension with Resistance at Ankles and Unilateral Counter Support  - 1 x daily - 7 x weekly - 1 sets - 10 reps - Standing Hip Flexion with Resistance Loop  - 1 x daily - 7 x weekly - 1 sets - 10 reps - 3 sec hold - Standing March with Counter Support  - 1 x daily - 7 x weekly - 1 sets - 10 reps - 3 hold - Standing Gastroc Stretch at Counter  - 1 x daily - 7 x weekly - 1 sets - 1 reps - 30 sec hold - Standing Bilateral Gastroc Stretch with Step  - 1 x daily - 7 x weekly - 1 sets - 1 reps - 20-30 hold   Access Code: [place] URL: [email or web address] Date: 04/17/2022 Prepared by: Maebelle Munroe  Exercises - Sidelying Hip Abduction  - 1 x daily - 7 x weekly - 3 sets - 10 reps - Standing Heel Raise with Support  - 1 x daily - 7 x weekly - 3 sets - 10 reps - Single Leg Heel Raise  - 1 x daily - 7 x weekly - 3 sets - 10 reps - Heel Toe Raises with Counter Support  - 1 x daily - 7 x weekly - 3 sets - 10 reps   PATIENT EDUCATION: 05-14-22 Education details: added SLS to HEP  Person educated: Patient and  Child(ren) Education method: Explanation, Demonstration, and Handouts Education comprehension: verbalized understanding and returned demonstration  HOME EXERCISE PROGRAM: Medbridge HEP  [email or web address]   GOALS: Goals reviewed with patient? Yes  SHORT TERM GOALS: Same as LTG's   LONG TERM GOALS: Target date: 05-15-22  Perform 5x sit to stand transfers in </= 15 secs to demo increased LE strength and improved balance upon initial standing.  Baseline: 19.5 secs (4 reps); 17.75; 05-12-22  12.60 secs Goal status: Goal met 05-12-22  2.  Improve TUG score to </= 12.5 secs to reduce fall risk and demo improved functional mobility. Baseline:  13.5 secs with no device;  9.94 secs Goal status: Goal met 05-12-22  3.  Increase gait speed to >/= 3.2 ft/sec without device for increased gait efficiency. Baseline: 11.78 secs = 2.78 ft/sec no device;   05-12-22:  10.88, 10.31 = 3.18 ft/sec without device Goal status: Goal met 05-12-22  4.  Amb.  500' on flat, even surface without LOB and without Lt foot catching for improved safety with community ambulation/accessibility. Baseline:  Goal status: Goal met - 16' with no device   5.  Independent in HEP for LLE strengthening and balance. Baseline:  Goal status: Goal met 05-12-22  UPDATED LTG's:  TARGET DATE 06-19-22              1.  Perform 5x sit to stand transfers in </= 10 secs to demo increased LE strength and improved balance upon initial standing.  Baseline: 19.5 secs (4 reps); 17.75; 05-12-22  12.60 secs:  15.28 secs without UE support from high/low mat table Goal status: Ongoing as not met - 06-25-22  2.  Increase gait speed to >/= 3.5 ft/sec without device for increased gait efficiency. Baseline: 11.78 secs = 2.78 ft/sec no device;   05-12-22:  10.88, 10.31 = 3.18 ft/sec without device Goal status: Ongoing  3.  Amb.  700' (6 laps)  on flat, even surface without LOB and without Lt foot catching for improved safety with community ambulation/accessibility.   Baseline: 59' with no device with cues to increase initial heel contact to decrease shuffling   Goal status:  Ongoing  4.  Transfer floor to stand with UE support on object with supervision.  Baseline:  TBA  Goal status:  Ongoing  5.  Independent in updated HEP for balance and strengthening exercises.              Baseline:  TBA             Goal status:  Ongoing  UPDATED LTG's:  TARGET DATE 07-24-22              1.  Perform 5x sit to stand transfers in </= 10 secs to demo increased LE strength and improved balance upon initial standing.  Baseline: 19.5 secs (4 reps); 17.75; 05-12-22  12.60 secs:  15.28 secs without UE support from high/low mat table;  07-21-22:  15.34 secs from chair:  12.94 secs from mat table Goal status: Partially met 07-21-22  2.  Increase gait speed to >/= 3.5 ft/sec without device for increased gait efficiency. Baseline: 11.78 secs = 2.78 ft/sec no device;   05-12-22:  10.88, 10.31 = 3.18 ft/sec without device;  11.59 secs = 2.83 ft/sec without device Goal status: NOT MET  3.  Amb.  700' (6 laps)  on flat, even surface without LOB and without Lt foot catching for improved safety with community ambulation/accessibility.  Baseline: 85' with no device with cues to increase initial heel contact to decrease shuffling;  07-21-22:  5 laps (575')  with 1 occurrence of Lt foot catching floor and stumbling   Goal status:  Not met - 07-21-22  4.  Transfer floor to stand with UE support on object with supervision.  Baseline:  TBA  Goal status:  Goal DEFERRED -  as pt declined to do this activity in today's session - did not feel it was necessary as he reports independence with this transfer at home  5.  Independent in updated HEP for balance and strengthening exercises.              Baseline:  TBA             Goal status:  Goal met 07-21-22   ASSESSMENT:  CLINICAL IMPRESSION: Pt has met LTG #5 as he reports independence in HEP for balance and strengthening exs.  LTG's #2 and 3  not met due to decreased gait speed and decreased endurance/activity tolerance.  LTG #4 deferred per pt's request as he declines to perform floor to stand transfer in today's session, stating he was able to do this transfer independently.  LTG #1 partially met as pt's time for 5x sit to stand transfers has improved (12.94 secs ) but not to stated goal level of >/= 10 secs.  Pt is discharged due to plateau in maximizing functional status at this time.  No further needs identified.    OBJECTIVE IMPAIRMENTS: Abnormal gait, decreased activity tolerance, decreased balance, decreased cognition, and decreased strength.   ACTIVITY LIMITATIONS: bending, squatting, stairs, transfers, and locomotion level  PARTICIPATION LIMITATIONS: cleaning, laundry, driving, shopping, community activity, and yard work  PERSONAL FACTORS: Fitness, Past/current experiences, and 1 comorbidity: carotid stenosis (dizziness)  are also affecting patient's functional outcome.   REHAB POTENTIAL: Good  CLINICAL DECISION MAKING: Stable/uncomplicated  EVALUATION COMPLEXITY: Low  PLAN:  PT FREQUENCY: 2x/week  PT DURATION: 4 weeks  PLANNED INTERVENTIONS: Therapeutic exercises, Therapeutic activity, Neuromuscular re-education, Balance training, Gait training, Patient/Family education, Self Care, Stair training, and DME instructions  PLAN FOR NEXT SESSION: D/C on 07-21-22    PHYSICAL THERAPY DISCHARGE SUMMARY  Visits from Start of Care: 17  Current functional level related to goals / functional outcomes: See above for progress towards goals   Remaining deficits: Continued gait deviations which increase/worsen with fatigue - decreased step length with slight shuffling Continued decreased high level balance skills   Education / Equipment: Pt has been instructed in HEP for balance and strengthening - compliance with this HEP is questionable based on pt's report   Patient agrees to discharge. Patient goals were partially  met. Patient is being discharged due to maximized rehab potential.  and also due to end of certification period. No further needs identified at this time.    Kary Kos, PT 07/22/2022, 5:24 PM

## 2022-07-22 ENCOUNTER — Encounter: Payer: Self-pay | Admitting: Physical Therapy

## 2022-07-22 NOTE — Progress Notes (Deleted)
Office Visit    Patient Name: William Matthews Date of Encounter: 07/22/2022  PCP:  Patient, No Pcp Per   [facility]  Cardiologist:  None *** Advanced Practice Provider:  No care team member to display Electrophysiologist:  None  {Press F2 to show EP APP, CHF, sleep or structural heart MD               :[id number]}  { Click here to update then REFRESH NOTE - MD (PCP) or APP (Team Member)  Change PCP Type for MD, Specialty for APP is either Cardiology or Clinical Cardiac Electrophysiology  :[id number]}  Chief Complaint    William Matthews is a 72 y.o. male with a past medical history of paroxysmal atrial fibrillation (on Eliquis), CAD status post CABG, CHF, CKD stage III, hypertension, hyperlipidemia, CVA presents today for follow-up visit.  Patient was instructed to go to the ED from the [facility] 06/18/2022 with chest pain and active atrial fibrillation.  Patient also reported increasing dyspnea on exertion for about a week but denied orthopnea or worsening lower extremity edema.  TTE from 11/17 shows LVEF 40 to 45%.  Echo from 2/24 shows improved EF of 50 to 55%, moderate LVH, mild MVR, mild AVR, and RAP 3 mmHg.  Chest x-ray without significant edema.  Patient admitted to symptoms on compliance at that time with medications including antihypertensives.  Also admitted to missing 1 dose of Eliquis the week prior to admission.  Is not taking Lasix either.  Discharged 5/12 with carvedilol and other GDMT held due to AKI.  Patient followed up with neurology with no changes in medications.  Instructed to follow-up with vascular surgery for right carotid stenosis.  Presented for heart failure follow-up 07/09/2022 at the [facility] clinic.  At that time, labs were obtained and instructed to continue furosemide, Farxiga, carvedilol, deferred spironolactone to next visit if creatinine was stable.  Today, he***  Past Medical History    Past Medical History:  Diagnosis Date   Anxiety    CAD  (coronary artery disease)    a. s/p CABG 1994 with LIMA to D2 and LAD, SVG to D1 and ramus intermedius. b. acute inferior MI 2001 s/p rescue PTCA/stent to [facility].  b. NSTEMI 02/2014: s/p LHC with DES to oLCx c. 09/2015 PCI with DES to left main/ostial LCx   Chronic bronchitis (HCC)    "they say I get it q yr" (10/03/2015)   CKD (chronic kidney disease), stage II    Depression    ED (erectile dysfunction)    GERD (gastroesophageal reflux disease)    Hyperlipidemia    Hypertension    Ischemic cardiomyopathy    a. EF 48% by nuc in 2012.   Myocardial infarction [facility]) "several"   Nephrolithiasis    Obesity    Prediabetes    Past Surgical History:  Procedure Laterality Date   CARDIAC CATHETERIZATION N/A 02/19/2015   Procedure: Left Heart Cath and Cors/Grafts Angiography;  Surgeon: Kathleene Hazel, MD;  LAD & RCA 100%, LIMA-LAD 50%, SVG-OM-D1 100% stenosis, distal limb D1-OM ok, CFX 99%   CARDIAC CATHETERIZATION  02/19/2015   Procedure: Coronary Stent Intervention;  Surgeon: Kathleene Hazel, MD; 3.5 x 16 mm Promus Premier DES to the ostial CFX    CARDIAC CATHETERIZATION N/A 10/03/2015   Procedure: Left Heart Cath and Cors/Grafts Angiography;  Surgeon: Peter M Swaziland, MD;  Location: [facility] INVASIVE CV LAB;  Service: Cardiovascular;  Laterality: N/A;   CARDIAC CATHETERIZATION N/A 10/03/2015  Procedure: Coronary Stent Intervention;  Surgeon: Peter M Swaziland, MD;  Location: [facility] INVASIVE CV LAB;  Service: Cardiovascular;  Laterality: N/A;  DES- Resolute 4.0x15 to distal left main into the ostial circumflex artery   CARDIAC CATHETERIZATION N/A 10/03/2015   Procedure: Intravascular Ultrasound/IVUS;  Surgeon: Peter M Swaziland, MD;  Location: [facility] INVASIVE CV LAB;  Service: Cardiovascular;  Laterality: N/A;   CORONARY ANGIOPLASTY WITH STENT PLACEMENT  1994 - 2001 X 5   "stents each time"   CORONARY ANGIOPLASTY WITH STENT PLACEMENT  10/03/2015   CORONARY ARTERY BYPASS GRAFT  1994   LIMA-D2-LAD, SVG-D1-OM2    CYSTOSCOPY W/ STONE MANIPULATION      Allergies  Allergies  Allergen Reactions   Ciprofloxacin Hcl Other (See Comments)    Unknown    Peanut-Containing Drug Products Other (See Comments)    Unknown    EKGs/Labs/Other Studies Reviewed:   The following studies were reviewed today: Cardiac Studies & Procedures   CARDIAC CATHETERIZATION  CARDIAC CATHETERIZATION 10/03/2015  Narrative  Ost LAD lesion, 100 %stenosed.  LIMA and is normal in caliber and widely patent  Mid LAD to Dist LAD lesion, 100 %stenosed.  2nd Mrg lesion, 40 %stenosed.  Origin to Prox Graft lesion, 100 %stenosed.  Prox Cx to Dist Cx lesion, 20 %stenosed.  Mid RCA-1 lesion, 40 %stenosed.  Mid RCA-2 lesion, 100 %stenosed.  Ost Cx lesion, 95 %stenosed.  A STENT RESOLUTE INTEG 4.0X15 drug eluting stent was successfully placed.  Post intervention, there is a 0% residual stenosis.  There is moderate left ventricular systolic dysfunction.  LV end diastolic pressure is mildly elevated.  The left ventricular ejection fraction is 35-45% by visual estimate.  1. Severe 3 vessel obstructive CAD. - 100% ostial LAD -  95% in stent restenosis in the distal left main/ostial LCx - 100% mid RCA. Some left to right collaterals to the distal vessel. 2. Patent LIMA to the second diagonal and LAD 3. Occluded SVG to the first diagonal and OM 4. Moderate LV dysfunction 5. Mildly elevated LVEDP 6. Successful repeat stenting of the left main/ostial LCx with DES using IVUS guidance.  Plan: DAPT for one year. Patient may be a candidate for the TWILIGHT trial. Risk factor modification.  Findings Coronary Findings Diagnostic  Dominance: Right  Left Anterior Descending The lesion is chronically occluded. The lesion is chronically occluded.  First Septal Branch Vessel is small in size.  Left Circumflex Vessel is large. The lesion was previously treated using a drug eluting stent between 6-12 months ago.  Previously placed stent displays restenosis. The stenosis was measured by a visual reading. IVUS was performed on the lesion. There is severe plaque burden detected. IVUS has determined that the lesion is calcified and eccentric. IVUS demonstrated focal mid in stent obstruction with focal severe calcification that appeared to limit prior stent expansion. The lesion is discrete. The lesion was previously treated. The lesion is segmental.  First Obtuse Marginal Branch Vessel is small in size.  Second Obtuse Marginal Branch Vessel is moderate in size. The lesion is segmental.  Right Coronary Artery The lesion is segmental. The lesion was previously treatedover 2 years ago. The lesion is chronically occluded.  Right Posterior Descending Artery Collaterals RPDA filled by collaterals from Dist LAD.  Sequential LIMA LIMA Graft To 2nd Diag, Dist LAD LIMA and is normal in caliber. Dist Graft lesion  between 2nd Diag and Dist LAD with no stenosis. The lesion is discrete.  Sequential Single Graft Graft To 1st Diag, 2nd Mrg  and is normal in caliber. The lesion is chronically occluded.  Intervention  Ost Cx lesion Angioplasty (Also treats lesions: Ost Cx to Prox Cx) Lesion crossed with guidewire using a WIRE ASAHI PROWATER 180CM. Pre-stent angioplasty was performed using a BALLOON [place] EUPHORA RX4.0X12. Maximum pressure: 18 atm. A STENT RESOLUTE INTEG 4.0X15 drug eluting stent was successfully placed. Stent strut is well apposed. Post-stent angioplasty was performed using a BALLOON [place] EUPHORA [id number]. Maximum pressure: 18 atm. The pre-interventional distal flow is normal (TIMI 3).  The post-interventional distal flow is normal (TIMI 3). The intervention was successful . No complications occurred at this lesion. IVUS was performed on the lesion post PCI using a CATH OPTICROSS. A second IVUS was performed on the lesion post PCIAfter initial IVUS demonstrated focal under expansion of the stent with  calcification we dilated the lesion with a 3.0 and then 4.0 mm [place] balloon to make sure the lesion could be adequately expanded. Follow up IVUS demonstrated improved expansion and we proceeded to place a new stent within the prior stent. IVUS demonstrated good expansion of the prior stent at the distal edge and proximal edge. We selected a 4.0 x 15 mm Resolute stent based on IVUS guidance. Post stent deployment IVUS demonstrated excellent stent expansion and apposition. There is a 0% residual stenosis post intervention.  Ost Cx to Prox Cx lesion Angioplasty (Also treats lesions: Ost Cx) See details in Ost Cx lesion. There is a 0% residual stenosis post intervention.   CARDIAC CATHETERIZATION  CARDIAC CATHETERIZATION 02/19/2015  Narrative  Dist Graft lesion, between 2nd Diag and Dist LAD, 50% stenosed.  1. Severe native vessel CAD s/p 4V CABG with 2/4 patent bypass grafts. 2. Chronically occluded mid RCA beyond the stent. Distal vessel fills from left to right collaterals through the LIMA to LAD. 3. Occluded ostial LAD. Mid and distal vessel fills from the patent LIMA graft. The second diagonal fills from the LIMA as well and supplies the mid LAD. There is a segment of 100% occlusion in the mid LAD proximal to LIMA insertion. The distal LAD fills from the LIMA graft. There is a 50% anastomotic lesion of the LIMA to the LAD. 4. Severe stenosis ostial Circumflex. The vein graft to the OM is occluded. The sequential segment of the vein graft to Diagonal 1 is patent. 5. Successful PTCA/DES x 1 ostium Circumflex.  Recommendations: Continue ASA and Brilinta for one year. Continue statin. Start beta blocker. Follow up on echo results.  Findings Coronary Findings Diagnostic  Dominance: Right  Left Anterior Descending Chronic total occlusion. Chronic total occlusion.  First Septal Branch The vessel is small in size.  Left Circumflex . Vessel is large. Discrete. Diffuse.  First Obtuse  Marginal Branch The vessel is small in size.  Second Obtuse Marginal Branch The vessel is moderate in size. Diffuse.  Right Coronary Artery Diffuse. The lesion was previously treated with a bare metal stent greater than two years ago. Chronic total occlusion.  Right Posterior Descending Artery Collaterals RPDA filled by collaterals from Dist LAD.  Sequential LIMA LIMA Graft To 2nd Diag, Dist LAD LIMA was injected is normal in caliber, and is anatomically normal. Discrete.  Sequential Single Graft Graft To 1st Diag, 2nd Mrg SVG was injected is normal in caliber. There is severe disease in the graft. Chronic total occlusion.  Intervention  Ost Cx to Prox Cx lesion PCI The pre-interventional distal flow is normal (TIMI 3). Pre-stent angioplasty was performed. A drug-eluting stent was placed. The strut  is apposed. Post-stent angioplasty was performed. The post-interventional distal flow is normal (TIMI 3). The intervention was successful. No complications occurred at this lesion. There is a 0% residual stenosis post intervention.     ECHOCARDIOGRAM  ECHOCARDIOGRAM COMPLETE 03/27/2022  Narrative ECHOCARDIOGRAM REPORT    Patient Name:   RILEE WENDLING Knouff Date of Exam: 03/27/2022 Medical Rec #:  [id number]      Height:       70.0 in Accession #:    [id number]     Weight:       273.6 lb Date of Birth:  1951/02/08       BSA:          2.385 m Patient Age:    71 years       BP:           124/74 mmHg Patient Gender: M              HR:           73 bpm. Exam Location:  Inpatient  Procedure: 2D Echo, Cardiac Doppler and Color Doppler  Indications:    TIA  History:        Patient has prior history of Echocardiogram examinations, most recent 01/08/2016. CAD, Chronic kidney disease, Arrythmias:Paroxysmal A-Fib; Risk Factors:Hypertension and Dyslipidemia.  Sonographer:    Delcie Roch RDCS Referring Phys: [id number] VISHAL R PATEL   Sonographer Comments: Image acquisition  challenging due to patient body habitus. IMPRESSIONS   1. Left ventricular ejection fraction, by estimation, is 50 to 55%. The left ventricle has low normal function. The left ventricle has no regional wall motion abnormalities. There is moderate left ventricular hypertrophy. Left ventricular diastolic parameters were normal. 2. Right ventricular systolic function is normal. The right ventricular size is normal. There is normal pulmonary artery systolic pressure. 3. The mitral valve is normal in structure. Mild mitral valve regurgitation. No evidence of mitral stenosis. 4. The aortic valve is normal in structure. Aortic valve regurgitation is mild. No aortic stenosis is present. 5. The inferior vena cava is normal in size with greater than 50% respiratory variability, suggesting right atrial pressure of 3 mmHg.  Conclusion(s)/Recommendation(s): No intracardiac source of embolism detected on this transthoracic study. Consider a transesophageal echocardiogram to exclude cardiac source of embolism if clinically indicated.  FINDINGS Left Ventricle: Left ventricular ejection fraction, by estimation, is 50 to 55%. The left ventricle has low normal function. The left ventricle has no regional wall motion abnormalities. The left ventricular internal cavity size was normal in size. There is moderate left ventricular hypertrophy. Left ventricular diastolic parameters were normal.  Right Ventricle: The right ventricular size is normal. No increase in right ventricular wall thickness. Right ventricular systolic function is normal. There is normal pulmonary artery systolic pressure. The tricuspid regurgitant velocity is 2.13 m/s, and with an assumed right atrial pressure of 3 mmHg, the estimated right ventricular systolic pressure is 21.1 mmHg.  Left Atrium: Left atrial size was normal in size.  Right Atrium: Right atrial size was normal in size.  Pericardium: There is no evidence of pericardial  effusion.  Mitral Valve: The mitral valve is normal in structure. Mild mitral valve regurgitation. No evidence of mitral valve stenosis.  Tricuspid Valve: The tricuspid valve is normal in structure. Tricuspid valve regurgitation is trivial. No evidence of tricuspid stenosis.  Aortic Valve: The aortic valve is normal in structure. Aortic valve regurgitation is mild. No aortic stenosis is present.  Pulmonic Valve: The pulmonic valve was normal in  structure. Pulmonic valve regurgitation is not visualized. No evidence of pulmonic stenosis.  Aorta: The aortic root is normal in size and structure.  Venous: The inferior vena cava is normal in size with greater than 50% respiratory variability, suggesting right atrial pressure of 3 mmHg.  IAS/Shunts: No atrial level shunt detected by color flow Doppler.   LEFT VENTRICLE PLAX 2D LVIDd:         5.30 cm      Diastology LVIDs:         4.20 cm      LV e' medial:    5.66 cm/s LV PW:         1.40 cm      LV E/e' medial:  16.0 LV IVS:        1.40 cm      LV e' lateral:   10.30 cm/s LVOT diam:     2.00 cm      LV E/e' lateral: 8.8 LV SV:         63 LV SV Index:   27 LVOT Area:     3.14 cm  LV Volumes (MOD) LV vol d, MOD A2C: 111.0 ml LV vol s, MOD A2C: 61.7 ml LV SV MOD A2C:     49.3 ml  RIGHT VENTRICLE             IVC RV Basal diam:  2.90 cm     IVC diam: 1.90 cm RV S prime:     11.30 cm/s TAPSE (M-mode): 2.2 cm  LEFT ATRIUM           Index        RIGHT ATRIUM           Index LA diam:      4.40 cm 1.85 cm/m   RA Area:     22.10 cm LA Vol (A2C): 50.0 ml 20.97 ml/m  RA Volume:   68.50 ml  28.73 ml/m LA Vol (A4C): 95.8 ml 40.17 ml/m AORTIC VALVE LVOT Vmax:   103.00 cm/s LVOT Vmean:  67.000 cm/s LVOT VTI:    0.202 m  AORTA Ao Root diam: 3.20 cm  MITRAL VALVE               TRICUSPID VALVE MV Area (PHT): 3.85 cm    TR Peak grad:   18.1 mmHg MV Decel Time: 197 msec    TR Vmax:        213.00 cm/s MV E velocity: 90.30 cm/s MV A  velocity: 69.20 cm/s  SHUNTS MV E/A ratio:  1.30        Systemic VTI:  0.20 m Systemic Diam: 2.00 cm  Donato Schultz MD Electronically signed by Donato Schultz MD Signature Date/Time: 03/27/2022/11:53:41 AM    Final              EKG:  EKG is *** ordered today.  The ekg ordered today demonstrates ***  Recent Labs: 06/18/2022: ALT 22 06/19/2022: TSH 5.856 06/21/2022: Hemoglobin 15.2; Magnesium 2.0; Platelets 208 07/17/2022: B Natriuretic Peptide 75.7; BUN 17; Creatinine, Ser 1.56; Potassium 4.0; Sodium 136  Recent Lipid Panel    Component Value Date/Time   CHOL 108 03/27/2022 0644   TRIG 148 03/27/2022 0644   HDL 30 (L) 03/27/2022 0644   CHOLHDL 3.6 03/27/2022 0644   VLDL 30 03/27/2022 0644   LDLCALC 48 03/27/2022 0644    Risk Assessment/Calculations:  {Does this patient have ATRIAL FIBRILLATION?:[phone]}  Home Medications   No outpatient medications have been marked  as taking for the 07/23/22 encounter (Appointment) with Sharlene Dory, PA-C.     Review of Systems   ***   All other systems reviewed and are otherwise negative except as noted above.  Physical Exam    VS:  There were no vitals taken for this visit. , BMI There is no height or weight on file to calculate BMI.  Wt Readings from Last 3 Encounters:  07/09/22 274 lb (124.3 kg)  07/01/22 274 lb (124.3 kg)  06/21/22 265 lb 6.9 oz (120.4 kg)     GEN: Well nourished, well developed, in no acute distress. HEENT: normal. Neck: Supple, no JVD, carotid bruits, or masses. Cardiac: ***RRR, no murmurs, rubs, or gallops. No clubbing, cyanosis, edema.  ***Radials/PT 2+ and equal bilaterally.  Respiratory:  ***Respirations regular and unlabored, clear to auscultation bilaterally. GI: Soft, nontender, nondistended. MS: No deformity or atrophy. Skin: Warm and dry, no rash. Neuro:  Strength and sensation are intact. Psych: Normal affect.  Assessment & Plan    Chronic HFimpEF Hypertension Paroxysmal atrial  fibrillation CAD status post CABG  No BP recorded.  {Refresh Note OR Click here to enter BP  :1}***      Disposition: Follow up {follow up:15908} with None or APP.  Signed, Sharlene Dory, PA-C 07/22/2022, 7:43 AM [facility]

## 2022-07-23 ENCOUNTER — Encounter: Payer: Self-pay | Admitting: Physician Assistant

## 2022-07-23 ENCOUNTER — Ambulatory Visit: Payer: Medicare HMO | Attending: Physician Assistant | Admitting: Physician Assistant

## 2022-08-20 DIAGNOSIS — Z6841 Body Mass Index (BMI) 40.0 and over, adult: Secondary | ICD-10-CM | POA: Diagnosis not present

## 2022-08-20 DIAGNOSIS — I130 Hypertensive heart and chronic kidney disease with heart failure and stage 1 through stage 4 chronic kidney disease, or unspecified chronic kidney disease: Secondary | ICD-10-CM | POA: Diagnosis not present

## 2022-08-20 DIAGNOSIS — I5022 Chronic systolic (congestive) heart failure: Secondary | ICD-10-CM | POA: Diagnosis not present

## 2022-08-20 DIAGNOSIS — C61 Malignant neoplasm of prostate: Secondary | ICD-10-CM | POA: Diagnosis not present

## 2022-08-20 DIAGNOSIS — N1831 Chronic kidney disease, stage 3a: Secondary | ICD-10-CM | POA: Diagnosis not present

## 2022-08-24 ENCOUNTER — Ambulatory Visit: Payer: Medicare HMO | Attending: Physician Assistant | Admitting: Physician Assistant

## 2022-08-24 ENCOUNTER — Encounter: Payer: Self-pay | Admitting: Physician Assistant

## 2022-08-24 VITALS — BP 124/74 | HR 78 | Ht 70.0 in | Wt 274.6 lb

## 2022-08-24 DIAGNOSIS — I480 Paroxysmal atrial fibrillation: Secondary | ICD-10-CM | POA: Diagnosis not present

## 2022-08-24 DIAGNOSIS — E785 Hyperlipidemia, unspecified: Secondary | ICD-10-CM | POA: Diagnosis not present

## 2022-08-24 DIAGNOSIS — I5030 Unspecified diastolic (congestive) heart failure: Secondary | ICD-10-CM

## 2022-08-24 DIAGNOSIS — I10 Essential (primary) hypertension: Secondary | ICD-10-CM

## 2022-08-24 MED ORDER — AMLODIPINE BESYLATE 5 MG PO TABS: 5.0000 mg | ORAL_TABLET | Freq: Every day | ORAL | 3 refills | Status: AC

## 2022-08-24 MED ORDER — AMIODARONE HCL 200 MG PO TABS: 200.0000 mg | ORAL_TABLET | Freq: Every day | ORAL | 3 refills | Status: AC

## 2022-08-24 MED ORDER — FUROSEMIDE 40 MG PO TABS: 40.0000 mg | ORAL_TABLET | Freq: Every day | ORAL | 3 refills | Status: AC

## 2022-08-24 MED ORDER — ATORVASTATIN CALCIUM 20 MG PO TABS: 40.0000 mg | ORAL_TABLET | Freq: Every day | ORAL | 3 refills | Status: DC

## 2022-08-24 NOTE — Progress Notes (Signed)
Cardiology Office Note:  .   Date:  08/24/2022  ID:  William Matthews, DOB 05-Oct-1950, MRN [id number] PCP: Patient, No Pcp Per  [facility] Providers Cardiologist:  None {  History of Present Illness: .   William Matthews is a 72 y.o. male with a past medical history of atrial fibrillation (on Eliquis), CAD status post CABG, CHF, CKD stage III, hypertension, hyperlipidemia, and CVA.  Patient was instructed to come to the ED from the [place] on 5/9 with chest pain and active atrial fibrillation.  Patient also reported increasing DOE for about 1 week but denied orthopnea or worsening lower extremity edema.  TTE from 12/2015 shows EF of 45 to 50%.  Echo from 2/24 shows improved EF of 50 to 55%, moderate LVH, mild MVR, mild AVR, and RAP 3 mmHg.  Chest x-ray without significant edema.  Patient admitted some noncompliance with medications at the time of ED visit.  Did miss 1 dose of Eliquis the past week prior to admission.  Also not taking Lasix.  Was discharged from 5/12 admission with carvedilol, other GDMT held due to AKI.  Patient followed up with neurology since discharge with no change to current medications.  Instructed to follow-up with vascular surgery for his right carotid stenosis.  Picked up prescription for Farxiga on 5/16 but was unclear what it was prescribed for.  Today, he tells me that he does not have any chest pains.  He does get short of breath at times when he is exerting himself.  He has fallen and hurt himself before.  Endorses lightheadedness and some dizziness.  The fall was mechanical where he slipped on a rug.  He thinks the lightheadedness has been going on for weeks.  We reviewed his most recent echocardiogram and carotid ultrasound both done in February of this year.  He was first diagnosed with atrial fibrillation in May 9.  He states he has gained about 10 pounds in the last couple of months.  He says he is hungry all the time and is eating more.  Could be a fluid component to  it as well however he does not have any lower extremity swelling today on exam.  Does have some potential abdominal swelling however.  We discussed taking Lasix in the morning so he is not up all night urinating.  We also discussed possible sleep study but he declined at this time.  STOP-BANG is 7.   Reports no chest pain, pressure, or tightness. No edema, orthopnea, PND. Reports no palpitations.   ROS: Pertinent ROS is in HPI  Studies Reviewed: .       Echocardiogram 03/27/2022 IMPRESSIONS     1. Left ventricular ejection fraction, by estimation, is 50 to 55%. The  left ventricle has low normal function. The left ventricle has no regional  wall motion abnormalities. There is moderate left ventricular hypertrophy.  Left ventricular diastolic  parameters were normal.   2. Right ventricular systolic function is normal. The right ventricular  size is normal. There is normal pulmonary artery systolic pressure.   3. The mitral valve is normal in structure. Mild mitral valve  regurgitation. No evidence of mitral stenosis.   4. The aortic valve is normal in structure. Aortic valve regurgitation is  mild. No aortic stenosis is present.   5. The inferior vena cava is normal in size with greater than 50%  respiratory variability, suggesting right atrial pressure of 3 mmHg.   Conclusion(s)/Recommendation(s): No intracardiac source of embolism  detected on this transthoracic study. Consider a transesophageal  echocardiogram to exclude cardiac source of embolism if clinically  indicated.   Risk Assessment/Calculations:    CHA2DS2-VASc Score = 6   This indicates a 9.7% annual risk of stroke. The patient's score is based upon: CHF History: 1 HTN History: 1 Diabetes History: 0 Stroke History: 2 Vascular Disease History: 1 Age Score: 1 Gender Score: 0       STOP-Bang Score:  7      Physical Exam:   VS:  BP 124/74   Pulse 78   Ht 5\' 10"  (1.778 m)   Wt 274 lb 9.6 oz (124.6 kg)   SpO2  94%   BMI 39.40 kg/m    Wt Readings from Last 3 Encounters:  08/24/22 274 lb 9.6 oz (124.6 kg)  07/09/22 274 lb (124.3 kg)  07/01/22 274 lb (124.3 kg)    GEN: Well nourished, well developed in no acute distress NECK: No JVD; No carotid bruits CARDIAC: RRR, no murmurs, rubs, gallops RESPIRATORY:  Clear to auscultation without rales, wheezing or rhonchi  ABDOMEN: Soft, non-tender, non-distended EXTREMITIES:  No edema; No deformity   ASSESSMENT AND PLAN: .   1.  Chronic HFimpEF -Most recent echocardiogram showed LVEF 50 to 55% -Continue current medications which include amiodarone 200 mg daily, amlodipine 5 mg daily, apixaban 5 mg daily, Lipitor 40 mg daily, Coreg 12.5 mg twice a day, Lasix 40 mg daily -Euvolemic on exam today however could be holding some fluid in his abdomen (first time seeing this patient) -Weight is up about 10 pounds but patient states that he stays hungry and has been eating more than usual -Encourage low-sodium, heart healthy diet with less processed food and less pizza as well as daily weights to monitor fluid status  2.  Atrial fibrillation -He is in normal sinus rhythm today, heart rate 78 -Continue current medication regimen  3.  Hypertension -Blood pressure well-controlled, 124/74 -Continue current medication regimen  4.  Hyperlipidemia -Continue Lipitor 40 mg daily -LDL 48, triglycerides 148, HDL 30, total cholesterol 696  5.  STOP-BANG of 7 -Patient declines at home sleep study at this time -I asked patient to please let us know if he changes his mind      Dispo: He can follow-up in 3 to 4 months with Dr. Tenny Craw  Signed, Sharlene Dory, PA-C

## 2022-08-24 NOTE — Patient Instructions (Signed)
Medication Instructions:   Your physician recommends that you continue on your current medications as directed. Please refer to the Current Medication list given to you today.  *If you need a refill on your cardiac medications before your next appointment, please call your pharmacy*   Lab Work:  NONE ORDERED  TODAY    If you have labs (blood work) drawn today and your tests are completely normal, you will receive your results only by: MyChart Message (if you have MyChart) OR A paper copy in the mail If you have any lab test that is abnormal or we need to change your treatment, we will call you to review the results.  Your physician has requested that you have a carotid duplex. This test is an ultrasound of the carotid arteries in your neck. It looks at blood flow through these arteries that supply the brain with blood. Allow one hour for this exam. There are no restrictions or special instructions.    Follow-Up: At [facility], you and your health needs are our priority.  As part of our continuing mission to provide you with exceptional heart care, we have created designated Provider Care Teams.  These Care Teams include your primary Cardiologist (physician) and Advanced Practice Providers (APPs -  Physician Assistants and Nurse Practitioners) who all work together to provide you with the care you need, when you need it.  We recommend signing up for the patient portal called "MyChart".  Sign up information is provided on this After Visit Summary.  MyChart is used to connect with patients for Virtual Visits (Telemedicine).  Patients are able to view lab/test results, encounter notes, upcoming appointments, etc.  Non-urgent messages can be sent to your provider as well.   To learn more about what you can do with MyChart, go to [email or web address].    Your next appointment:   4 month(s)  Provider:   ROSS     Other Instructions  Low-Sodium Eating Plan Salt (sodium) helps  you keep a healthy balance of fluids in your body. Too much sodium can raise your blood pressure. It can also cause fluid and waste to be held in your body. Your health care provider or dietitian may recommend a low-sodium eating plan if you have high blood pressure (hypertension), kidney disease, liver disease, or heart failure. Eating less sodium can help lower your blood pressure and reduce swelling. It can also protect your heart, liver, and kidneys. What are tips for following this plan? Reading food labels  Check food labels for the amount of sodium per serving. If you eat more than one serving, you must multiply the listed amount by the number of servings. Choose foods with less than 140 milligrams (mg) of sodium per serving. Avoid foods with 300 mg of sodium or more per serving. Always check how much sodium is in a product, even if the label says "unsalted" or "no salt added." Shopping  Buy products labeled as "low-sodium" or "no salt added." Buy fresh foods. Avoid canned foods and pre-made or frozen meals. Avoid canned, cured, or processed meats. Buy breads that have less than 80 mg of sodium per slice. Cooking  Eat more home-cooked food. Try to eat less restaurant, buffet, and fast food. Try not to add salt when you cook. Use salt-free seasonings or herbs instead of table salt or sea salt. Check with your provider or pharmacist before using salt substitutes. Cook with plant-based oils, such as canola, sunflower, or olive oil. Meal planning When eating  at a restaurant, ask if your food can be made with less salt or no salt. Avoid dishes labeled as brined, pickled, cured, or smoked. Avoid dishes made with soy sauce, miso, or teriyaki sauce. Avoid foods that have monosodium glutamate (MSG) in them. MSG may be added to some restaurant food, sauces, soups, bouillon, and canned foods. Make meals that can be grilled, baked, poached, roasted, or steamed. These are often made with less  sodium. General information Try to limit your sodium intake to 1,500-2,300 mg each day, or the amount told by your provider. What foods should I eat? Fruits Fresh, frozen, or canned fruit. Fruit juice. Vegetables Fresh or frozen vegetables. "No salt added" canned vegetables. "No salt added" tomato sauce and paste. Low-sodium or reduced-sodium tomato and vegetable juice. Grains Low-sodium cereals, such as oats, puffed wheat and rice, and shredded wheat. Low-sodium crackers. Unsalted rice. Unsalted pasta. Low-sodium bread. Whole grain breads and whole grain pasta. Meats and other proteins Fresh or frozen meat, poultry, seafood, and fish. These should have no added salt. Low-sodium canned tuna and salmon. Unsalted nuts. Dried peas, beans, and lentils without added salt. Unsalted canned beans. Eggs. Unsalted nut butters. Dairy Milk. Soy milk. Cheese that is naturally low in sodium, such as ricotta cheese, fresh mozzarella, or Swiss cheese. Low-sodium or reduced-sodium cheese. Cream cheese. Yogurt. Seasonings and condiments Fresh and dried herbs and spices. Salt-free seasonings. Low-sodium mustard and ketchup. Sodium-free salad dressing. Sodium-free light mayonnaise. Fresh or refrigerated horseradish. Lemon juice. Vinegar. Other foods Homemade, reduced-sodium, or low-sodium soups. Unsalted popcorn and pretzels. Low-salt or salt-free chips. The items listed above may not be all the foods and drinks you can have. Talk to a dietitian to learn more. What foods should I avoid? Vegetables Sauerkraut, pickled vegetables, and relishes. Olives. [place] fries. Onion rings. Regular canned vegetables, except low-sodium or reduced-sodium items. Regular canned tomato sauce and paste. Regular tomato and vegetable juice. Frozen vegetables in sauces. Grains Instant hot cereals. Bread stuffing, pancake, and biscuit mixes. Croutons. Seasoned rice or pasta mixes. Noodle soup cups. Boxed or frozen macaroni and cheese.  Regular salted crackers. Self-rising flour. Meats and other proteins Meat or fish that is salted, canned, smoked, spiced, or pickled. Precooked or cured meat, such as sausages or meat loaves. Tomasa Blase. Ham. Pepperoni. Hot dogs. Corned beef. Chipped beef. Salt pork. Jerky. Pickled herring, anchovies, and sardines. Regular canned tuna. Salted nuts. Dairy Processed cheese and cheese spreads. Hard cheeses. Cheese curds. Blue cheese. Feta cheese. String cheese. Regular cottage cheese. Buttermilk. Canned milk. Fats and oils Salted butter. Regular margarine. Ghee. Bacon fat. Seasonings and condiments Onion salt, garlic salt, seasoned salt, table salt, and sea salt. Canned and packaged gravies. Worcestershire sauce. Tartar sauce. Barbecue sauce. Teriyaki sauce. Soy sauce, including reduced-sodium soy sauce. Steak sauce. Fish sauce. Oyster sauce. Cocktail sauce. Horseradish that you find on the shelf. Regular ketchup and mustard. Meat flavorings and tenderizers. Bouillon cubes. Hot sauce. Pre-made or packaged marinades. Pre-made or packaged taco seasonings. Relishes. Regular salad dressings. Salsa. Other foods Salted popcorn and pretzels. Corn chips and puffs. Potato and tortilla chips. Canned or dried soups. Pizza. Frozen entrees and pot pies. The items listed above may not be all the foods and drinks you should avoid. Talk to a dietitian to learn more. This information is not intended to replace advice given to you by your health care provider. Make sure you discuss any questions you have with your health care provider. Document Revised: 02/12/2022 Document Reviewed: 02/12/2022 Elsevier Patient Education  2024 [employer]  Heart-Healthy Eating Plan Eating a healthy diet is important for the health of your heart. A heart-healthy eating plan includes: Eating less unhealthy fats. Eating more healthy fats. Eating less salt in your food. Salt is also called sodium. Making other changes in your diet. Talk  with your doctor or a diet specialist (dietitian) to create an eating plan that is right for you. What is my plan? Your doctor may recommend an eating plan that includes: Total fat: ______% or less of total calories a day. Saturated fat: ______% or less of total calories a day. Cholesterol: less than _________mg a day. Sodium: less than _________mg a day. What are tips for following this plan? Cooking Avoid frying your food. Try to bake, boil, grill, or broil it instead. You can also reduce fat by: Removing the skin from poultry. Removing all visible fats from meats. Steaming vegetables in water or broth. Meal planning  At meals, divide your plate into four equal parts: Fill one-half of your plate with vegetables and green salads. Fill one-fourth of your plate with whole grains. Fill one-fourth of your plate with lean protein foods. Eat 2-4 cups of vegetables per day. One cup of vegetables is: 1 cup (91 g) broccoli or cauliflower florets. 2 medium carrots. 1 large bell pepper. 1 large sweet potato. 1 large tomato. 1 medium white potato. 2 cups (150 g) raw leafy greens. Eat 1-2 cups of fruit per day. One cup of fruit is: 1 small apple 1 large banana 1 cup (237 g) mixed fruit, 1 large orange,  cup (82 g) dried fruit, 1 cup (240 mL) 100% fruit juice. Eat more foods that have soluble fiber. These are apples, broccoli, carrots, beans, peas, and barley. Try to get 20-30 g of fiber per day. Eat 4-5 servings of nuts, legumes, and seeds per week: 1 serving of dried beans or legumes equals  cup (90 g) cooked. 1 serving of nuts is  oz (12 almonds, 24 pistachios, or 7 walnut halves). 1 serving of seeds equals  oz (8 g). General information Eat more home-cooked food. Eat less restaurant, buffet, and fast food. Limit or avoid alcohol. Limit foods that are high in starch and sugar. Avoid fried foods. Lose weight if you are overweight. Keep track of how much salt (sodium) you eat.  This is important if you have high blood pressure. Ask your doctor to tell you more about this. Try to add vegetarian meals each week. Fats Choose healthy fats. These include olive oil and canola oil, flaxseeds, walnuts, almonds, and seeds. Eat more omega-3 fats. These include salmon, mackerel, sardines, tuna, flaxseed oil, and ground flaxseeds. Try to eat fish at least 2 times each week. Check food labels. Avoid foods with trans fats or high amounts of saturated fat. Limit saturated fats. These are often found in animal products, such as meats, butter, and cream. These are also found in plant foods, such as palm oil, palm kernel oil, and coconut oil. Avoid foods with partially hydrogenated oils in them. These have trans fats. Examples are stick margarine, some tub margarines, cookies, crackers, and other baked goods. What foods should I eat? Fruits All fresh, canned (in natural juice), or frozen fruits. Vegetables Fresh or frozen vegetables (raw, steamed, roasted, or grilled). Green salads. Grains Most grains. Choose whole wheat and whole grains most of the time. Rice and pasta, including brown rice and pastas made with whole wheat. Meats and other proteins Lean, well-trimmed beef, veal, pork, and lamb.  Chicken and [place] without skin. All fish and shellfish. Wild duck, rabbit, pheasant, and venison. Egg whites or low-cholesterol egg substitutes. Dried beans, peas, lentils, and tofu. Seeds and most nuts. Dairy Low-fat or nonfat cheeses, including ricotta and mozzarella. Skim or 1% milk that is liquid, powdered, or evaporated. Buttermilk that is made with low-fat milk. Nonfat or low-fat yogurt. Fats and oils Non-hydrogenated (trans-free) margarines. Vegetable oils, including soybean, sesame, sunflower, olive, peanut, safflower, corn, canola, and cottonseed. Salad dressings or mayonnaise made with a vegetable oil. Beverages Mineral water. Coffee and tea. Diet carbonated beverages. Sweets and  desserts Sherbet, gelatin, and fruit ice. Small amounts of dark chocolate. Limit all sweets and desserts. Seasonings and condiments All seasonings and condiments. The items listed above may not be a complete list of foods and drinks you can eat. Contact a dietitian for more options. What foods should I avoid? Fruits Canned fruit in heavy syrup. Fruit in cream or butter sauce. Fried fruit. Limit coconut. Vegetables Vegetables cooked in cheese, cream, or butter sauce. Fried vegetables. Grains Breads that are made with saturated or trans fats, oils, or whole milk. Croissants. Sweet rolls. Donuts. High-fat crackers, such as cheese crackers. Meats and other proteins Fatty meats, such as hot dogs, ribs, sausage, bacon, rib-eye roast or steak. High-fat deli meats, such as salami and bologna. Caviar. Domestic duck and goose. Organ meats, such as liver. Dairy Cream, sour cream, cream cheese, and creamed cottage cheese. Whole-milk cheeses. Whole or 2% milk that is liquid, evaporated, or condensed. Whole buttermilk. Cream sauce or high-fat cheese sauce. Yogurt that is made from whole milk. Fats and oils Meat fat, or shortening. Cocoa butter, hydrogenated oils, palm oil, coconut oil, palm kernel oil. Solid fats and shortenings, including bacon fat, salt pork, lard, and butter. Nondairy cream substitutes. Salad dressings with cheese or sour cream. Beverages Regular sodas and juice drinks with added sugar. Sweets and desserts Frosting. Pudding. Cookies. Cakes. Pies. Milk chocolate or white chocolate. Buttered syrups. Full-fat ice cream or ice cream drinks. The items listed above may not be a complete list of foods and drinks to avoid. Contact a dietitian for more information. Summary Heart-healthy meal planning includes eating less unhealthy fats, eating more healthy fats, and making other changes in your diet. Eat a balanced diet. This includes fruits and vegetables, low-fat or nonfat dairy, lean  protein, nuts and legumes, whole grains, and heart-healthy oils and fats. This information is not intended to replace advice given to you by your health care provider. Make sure you discuss any questions you have with your health care provider. Document Revised: 03/03/2021 Document Reviewed: 03/03/2021 Elsevier Patient Education  2024 [employer].

## 2022-08-25 ENCOUNTER — Other Ambulatory Visit: Payer: Self-pay | Admitting: Pharmacist

## 2022-08-25 MED ORDER — ATORVASTATIN CALCIUM 40 MG PO TABS: 40.0000 mg | ORAL_TABLET | Freq: Every day | ORAL | 3 refills | Status: AC

## 2022-08-25 NOTE — Progress Notes (Signed)
Received message that insurance doesn't cover 2 atorvastatin tabs per day -rx was written for 2 of the 20mg  tabs daily. Spoke with pt, no issues swallowing pill. Updating rx for 1 of the 40mg  tabs daily, pt is aware.

## 2022-08-27 DIAGNOSIS — Z79899 Other long term (current) drug therapy: Secondary | ICD-10-CM | POA: Diagnosis not present

## 2022-09-10 DIAGNOSIS — D6949 Other primary thrombocytopenia: Secondary | ICD-10-CM | POA: Diagnosis not present

## 2022-09-10 DIAGNOSIS — Z7901 Long term (current) use of anticoagulants: Secondary | ICD-10-CM | POA: Diagnosis not present

## 2022-09-10 DIAGNOSIS — B351 Tinea unguium: Secondary | ICD-10-CM | POA: Diagnosis not present

## 2022-09-10 DIAGNOSIS — Z6841 Body Mass Index (BMI) 40.0 and over, adult: Secondary | ICD-10-CM | POA: Diagnosis not present

## 2022-09-10 DIAGNOSIS — R42 Dizziness and giddiness: Secondary | ICD-10-CM | POA: Diagnosis not present

## 2022-09-10 DIAGNOSIS — I130 Hypertensive heart and chronic kidney disease with heart failure and stage 1 through stage 4 chronic kidney disease, or unspecified chronic kidney disease: Secondary | ICD-10-CM | POA: Diagnosis not present

## 2022-09-10 DIAGNOSIS — F331 Major depressive disorder, recurrent, moderate: Secondary | ICD-10-CM | POA: Diagnosis not present

## 2022-09-10 DIAGNOSIS — N1831 Chronic kidney disease, stage 3a: Secondary | ICD-10-CM | POA: Diagnosis not present

## 2022-09-10 DIAGNOSIS — I5022 Chronic systolic (congestive) heart failure: Secondary | ICD-10-CM | POA: Diagnosis not present

## 2022-09-10 DIAGNOSIS — Z79899 Other long term (current) drug therapy: Secondary | ICD-10-CM | POA: Diagnosis not present

## 2022-09-10 DIAGNOSIS — R296 Repeated falls: Secondary | ICD-10-CM | POA: Diagnosis not present

## 2022-09-29 DIAGNOSIS — F330 Major depressive disorder, recurrent, mild: Secondary | ICD-10-CM | POA: Diagnosis not present

## 2022-10-08 ENCOUNTER — Ambulatory Visit: Payer: No Typology Code available for payment source | Admitting: Vascular Surgery

## 2022-10-08 ENCOUNTER — Ambulatory Visit (HOSPITAL_COMMUNITY): Payer: No Typology Code available for payment source | Attending: Vascular Surgery

## 2022-10-13 DIAGNOSIS — N400 Enlarged prostate without lower urinary tract symptoms: Secondary | ICD-10-CM | POA: Diagnosis not present

## 2022-10-13 DIAGNOSIS — L309 Dermatitis, unspecified: Secondary | ICD-10-CM | POA: Diagnosis not present

## 2022-10-13 DIAGNOSIS — E785 Hyperlipidemia, unspecified: Secondary | ICD-10-CM | POA: Diagnosis not present

## 2022-10-13 DIAGNOSIS — E039 Hypothyroidism, unspecified: Secondary | ICD-10-CM | POA: Diagnosis not present

## 2022-10-13 DIAGNOSIS — N529 Male erectile dysfunction, unspecified: Secondary | ICD-10-CM | POA: Diagnosis not present

## 2022-10-13 DIAGNOSIS — K08409 Partial loss of teeth, unspecified cause, unspecified class: Secondary | ICD-10-CM | POA: Diagnosis not present

## 2022-10-13 DIAGNOSIS — K219 Gastro-esophageal reflux disease without esophagitis: Secondary | ICD-10-CM | POA: Diagnosis not present

## 2022-10-13 DIAGNOSIS — I252 Old myocardial infarction: Secondary | ICD-10-CM | POA: Diagnosis not present

## 2022-10-13 DIAGNOSIS — H269 Unspecified cataract: Secondary | ICD-10-CM | POA: Diagnosis not present

## 2022-10-13 DIAGNOSIS — M1990 Unspecified osteoarthritis, unspecified site: Secondary | ICD-10-CM | POA: Diagnosis not present

## 2022-10-13 DIAGNOSIS — K5900 Constipation, unspecified: Secondary | ICD-10-CM | POA: Diagnosis not present

## 2022-10-13 DIAGNOSIS — N3941 Urge incontinence: Secondary | ICD-10-CM | POA: Diagnosis not present

## 2022-10-27 ENCOUNTER — Ambulatory Visit (HOSPITAL_COMMUNITY): Admission: RE | Admit: 2022-10-27 | Payer: No Typology Code available for payment source | Source: Ambulatory Visit

## 2022-10-27 ENCOUNTER — Encounter (HOSPITAL_COMMUNITY): Payer: Self-pay

## 2022-10-27 DIAGNOSIS — C61 Malignant neoplasm of prostate: Secondary | ICD-10-CM | POA: Diagnosis not present

## 2022-11-03 ENCOUNTER — Ambulatory Visit: Payer: No Typology Code available for payment source | Admitting: Vascular Surgery

## 2022-11-06 ENCOUNTER — Encounter (HOSPITAL_COMMUNITY): Payer: Self-pay

## 2022-11-06 ENCOUNTER — Encounter (HOSPITAL_COMMUNITY): Payer: Medicare HMO

## 2022-11-09 ENCOUNTER — Ambulatory Visit: Payer: No Typology Code available for payment source | Admitting: Vascular Surgery

## 2023-01-03 NOTE — Progress Notes (Deleted)
Pt did not show up for appt

## 2023-01-04 NOTE — Progress Notes (Deleted)
ASSESSMENT & PLAN   MILD RIGHT CAROTID STENOSIS: This patient has a mild right carotid stenosis secondary to a focal calcific plaque.  By CT angiogram the stenosis looks fairly smooth.  By duplex end-diastolic velocity criteria the stenosis is less than 30%.  He did have some slurred speech and left-sided weakness.  The left-sided weakness has resolved.  Given that there is really currently no significant stenosis on the right I would not recommend considering surgery unless the stenosis progressed significantly or he developed recurrent right hemispheric symptoms.  I have ordered a follow-up carotid duplex scan in 6 months and I will see him back at that time.  He is on aspirin and is on a statin.  We have also discussed the importance of nutrition and exercise.  He is also on Eliquis for A-fib.  DIZZINESS: He does have issues with dizziness but both vertebral arteries are patent with antegrade flow and there is no evidence of vertebrobasilar insufficiency.  Likewise he has no significant extracranial carotid disease which would be responsible for his dizziness.  REASON FOR CONSULT:    Right carotid stenosis.  The consult is requested by [facility] hospitalist.  HPI:   William Matthews is a 72 y.o. male who was hospitalized from 03/26/2022 to 03/28/2022 after he presented with left facial droop and slurred speech.   My history the patient had been having some issues with slurred speech intermittently.  He presented to the emergency department on 03/26/2022 with some left facial droop.  His CT angiogram showed a mild stenosis in the right ICA secondary to a focal calcific plaque.  The stenosis was fairly smooth.  Duplex scan showed mild stenosis on the right.  He has an MRI showed no acute intracranial process with no evidence of acute or subacute infarct.  He comes in for an outpatient visit.  The patient today complains of some persistent issues with dizziness which she has had for some time.  He said no  focal weakness.  He states that his speech really has not changed much.  He is on aspirin and is on a statin.  He is on Eliquis for A-fib.  His past medical history is otherwise significant for hypertension, hypercholesterolemia, coronary artery disease, previous CABG, CKD 3, and a history of congestive heart failure.  He is also on Eliquis for paroxysmal atrial fibrillation.  Past Medical History:  Diagnosis Date   Anxiety    CAD (coronary artery disease)    a. s/p CABG 1994 with LIMA to D2 and LAD, SVG to D1 and ramus intermedius. b. acute inferior MI 2001 s/p rescue PTCA/stent to [facility].  b. NSTEMI 02/2014: s/p LHC with DES to oLCx c. 09/2015 PCI with DES to left main/ostial LCx   Chronic bronchitis (HCC)    "they say I get it q yr" (10/03/2015)   CKD (chronic kidney disease), stage II    Depression    ED (erectile dysfunction)    GERD (gastroesophageal reflux disease)    Hyperlipidemia    Hypertension    Ischemic cardiomyopathy    a. EF 48% by nuc in 2012.   Myocardial infarction [facility]) "several"   Nephrolithiasis    Obesity    Prediabetes     Family History  Problem Relation Age of Onset   Coronary artery disease Mother        Died at age 18 of MI   Coronary artery disease Father        MI age 45  SOCIAL HISTORY: Social History   Tobacco Use   Smoking status: Never   Smokeless tobacco: Never  Substance Use Topics   Alcohol use: No    Allergies  Allergen Reactions   Ciprofloxacin Hcl Other (See Comments)    Unknown    Peanut-Containing Drug Products Other (See Comments)    Unknown     Current Outpatient Medications  Medication Sig Dispense Refill   amiodarone (PACERONE) 200 MG tablet Take 1 tablet (200 mg total) by mouth daily. Pt. Takes 1 tablet daily. 90 tablet 3   amLODipine (NORVASC) 5 MG tablet Take 1 tablet (5 mg total) by mouth daily. 90 tablet 3   apixaban (ELIQUIS) 5 MG TABS tablet Take 1 tablet (5 mg total) by mouth daily. 60 tablet 11    atorvastatin (LIPITOR) 40 MG tablet Take 1 tablet (40 mg total) by mouth at bedtime. 90 tablet 3   carvedilol (COREG) 12.5 MG tablet Take 1 tablet (12.5 mg total) by mouth 2 (two) times daily with a meal. 180 tablet 3   esomeprazole (NEXIUM) 40 MG capsule Take 40 mg by mouth daily.     furosemide (LASIX) 40 MG tablet Take 1 tablet (40 mg total) by mouth daily. 90 tablet 3   lidocaine (LIDODERM) 5 % Place 1 patch onto the skin daily. Remove & Discard patch within 12 hours or as directed by MD 30 patch 0   mirabegron ER (MYRBETRIQ) 50 MG TB24 tablet Take 50 mg by mouth at bedtime.     No current facility-administered medications for this visit.    REVIEW OF SYSTEMS:  [X]  denotes positive finding, [ ]  denotes negative finding Cardiac  Comments:  Chest pain or chest pressure:    Shortness of breath upon exertion: x   Short of breath when lying flat: x   Irregular heart rhythm:        Vascular    Pain in calf, thigh, or hip brought on by ambulation: x   Pain in feet at night that wakes you up from your sleep:     Blood clot in your veins:    Leg swelling:         Pulmonary    Oxygen at home:    Productive cough:     Wheezing:         Neurologic    Sudden weakness in arms or legs:     Sudden numbness in arms or legs:     Sudden onset of difficulty speaking or slurred speech:    Temporary loss of vision in one eye:     Problems with dizziness:         Gastrointestinal    Blood in stool:     Vomited blood:         Genitourinary    Burning when urinating:     Blood in urine:        Psychiatric    Major depression:         Hematologic    Bleeding problems:    Problems with blood clotting too easily:        Skin    Rashes or ulcers:        Constitutional    Fever or chills:    -  PHYSICAL EXAM:   There were no vitals filed for this visit.  There is no height or weight on file to calculate BMI. GENERAL: The patient is a well-nourished male, in no acute distress. The  vital signs are  documented above. CARDIAC: There is a regular rate and rhythm.  VASCULAR: I do not detect carotid bruits. He has palpable femoral, popliteal, dorsalis pedis, and posterior tibial pulses bilaterally. He has some hyperpigmentation consistent with chronic venous insufficiency. PULMONARY: There is good air exchange bilaterally without wheezing or rales. ABDOMEN: Soft and non-tender with normal pitched bowel sounds.  MUSCULOSKELETAL: There are no major deformities. NEUROLOGIC: No focal weakness or paresthesias are detected. SKIN: There are no ulcers or rashes noted. PSYCHIATRIC: The patient has a normal affect.  DATA:    MRI: His MRI does not show an acute stroke.  CAROTID DUPLEX: I reviewed carotid duplex scan that was done while he was in the hospital.  He had a less than 39% carotid stenosis bilaterally.  Both vertebral arteries were patent with antegrade flow.  CT ANGIO NECK: I reviewed the CT angio of the neck that was done during his hospitalization.  This showed a focal calcific plaque that produced a mild stenosis in the proximal right ICA.  The stenosis was fairly smooth.  Leonie Douglas [facility]

## 2023-01-05 ENCOUNTER — Ambulatory Visit: Payer: No Typology Code available for payment source | Admitting: Vascular Surgery

## 2023-01-05 ENCOUNTER — Encounter (HOSPITAL_COMMUNITY): Payer: Self-pay

## 2023-01-05 ENCOUNTER — Ambulatory Visit (HOSPITAL_COMMUNITY): Payer: Medicare HMO | Attending: Internal Medicine | Admitting: Internal Medicine

## 2023-01-05 ENCOUNTER — Ambulatory Visit (HOSPITAL_COMMUNITY): Admission: RE | Admit: 2023-01-05 | Payer: Medicare HMO | Source: Ambulatory Visit

## 2023-01-21 DIAGNOSIS — I5022 Chronic systolic (congestive) heart failure: Secondary | ICD-10-CM | POA: Diagnosis not present

## 2023-01-21 DIAGNOSIS — Z87448 Personal history of other diseases of urinary system: Secondary | ICD-10-CM | POA: Diagnosis not present

## 2023-01-21 DIAGNOSIS — N401 Enlarged prostate with lower urinary tract symptoms: Secondary | ICD-10-CM | POA: Diagnosis not present

## 2023-01-21 DIAGNOSIS — R7303 Prediabetes: Secondary | ICD-10-CM | POA: Diagnosis not present

## 2023-01-21 DIAGNOSIS — M25511 Pain in right shoulder: Secondary | ICD-10-CM | POA: Diagnosis not present

## 2023-01-21 DIAGNOSIS — Z0000 Encounter for general adult medical examination without abnormal findings: Secondary | ICD-10-CM | POA: Diagnosis not present

## 2023-01-21 DIAGNOSIS — Z7689 Persons encountering health services in other specified circumstances: Secondary | ICD-10-CM | POA: Diagnosis not present

## 2023-01-21 DIAGNOSIS — Z789 Other specified health status: Secondary | ICD-10-CM | POA: Diagnosis not present

## 2023-01-21 DIAGNOSIS — I4892 Unspecified atrial flutter: Secondary | ICD-10-CM | POA: Diagnosis not present

## 2023-01-21 DIAGNOSIS — E6609 Other obesity due to excess calories: Secondary | ICD-10-CM | POA: Diagnosis not present

## 2023-01-21 DIAGNOSIS — Z0130 Encounter for examination of blood pressure without abnormal findings: Secondary | ICD-10-CM | POA: Diagnosis not present

## 2023-02-04 DIAGNOSIS — M25511 Pain in right shoulder: Secondary | ICD-10-CM | POA: Diagnosis not present

## 2023-02-08 DIAGNOSIS — I4819 Other persistent atrial fibrillation: Secondary | ICD-10-CM | POA: Diagnosis not present

## 2023-02-08 DIAGNOSIS — M25511 Pain in right shoulder: Secondary | ICD-10-CM | POA: Diagnosis not present

## 2023-02-08 DIAGNOSIS — N401 Enlarged prostate with lower urinary tract symptoms: Secondary | ICD-10-CM | POA: Diagnosis not present

## 2023-02-08 DIAGNOSIS — R7303 Prediabetes: Secondary | ICD-10-CM | POA: Diagnosis not present

## 2023-02-08 DIAGNOSIS — D696 Thrombocytopenia, unspecified: Secondary | ICD-10-CM | POA: Diagnosis not present

## 2023-02-08 DIAGNOSIS — Z789 Other specified health status: Secondary | ICD-10-CM | POA: Diagnosis not present

## 2023-02-08 DIAGNOSIS — Z0130 Encounter for examination of blood pressure without abnormal findings: Secondary | ICD-10-CM | POA: Diagnosis not present

## 2023-02-08 DIAGNOSIS — R768 Other specified abnormal immunological findings in serum: Secondary | ICD-10-CM | POA: Diagnosis not present

## 2023-02-08 DIAGNOSIS — N1831 Chronic kidney disease, stage 3a: Secondary | ICD-10-CM | POA: Diagnosis not present

## 2023-02-10 NOTE — Progress Notes (Signed)
 ASSESSMENT & PLAN   PATIENT IS A 73-YEAR-OLD MALE PRESENTING FOR CAROTID STENOSIS FOLLOW-UP.  HE REMAINS ASYMPTOMATIC.  MILD RIGHT CAROTID STENOSIS:   Patient is a 73 year old male presenting for carotid stenosis follow-up.  He remains asymptomatic. Imaging was reviewed demonstrating mild bilateral carotid artery stenosis. I had a long conversation with [name] and his son regarding the above.  I asked him to continue his current medication regimen.  He is not smoking.  I think the likelihood that this progresses to needing intervention is very low.  I plan to see him in 1 years time.  Should imaging remain unchanged, will likely further lengthen follow-up time.  We discussed the signs symptoms of stroke, TIA, amaurosis.  He was asked to call 911 should any of these occur.  Should he become symptomatic from his low blood pressure, I instructed to reach out to his PCP at the [facility] to adjust his antihypertensive medication regimen   HPI:   William Matthews is a 73 y.o. male who was hospitalized from 03/26/2022 to 03/28/2022 after he presented with left facial droop and slurred speech.   His CT angiogram showed a mild stenosis in the right ICA secondary to a focal calcific plaque.  The stenosis was fairly smooth.  Duplex scan showed mild stenosis on the right.  He has an MRI showed no acute intracranial process with no evidence of acute or subacute infarct.  Surgery was not offered due to the limited amount of carotid disease, but follow-up was scheduled.  He presents today for 6-month follow-up.  On exam, he was doing well, accompanied by his son. A native of [place], he graduated from [employer].  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  He is now retired.  He has had no strokelike symptoms since last seen.  Denies TIA, stroke, amaurosis. Did not note the dizziness he has at previous visits.  He stated his blood pressure has been low, 90/60, but he  remains asymptomatic.  He is on aspirin  and is on a statin.  He is on Eliquis  for A-fib.  His past medical history is otherwise significant for hypertension, hypercholesterolemia, coronary artery disease, previous CABG, CKD 3, and a history of congestive heart failure.  He is also on Eliquis  for paroxysmal atrial fibrillation.  Past Medical History:  Diagnosis Date   Anxiety    CAD (coronary artery disease)    a. s/p CABG 1994 with LIMA to D2 and LAD, SVG to D1 and ramus intermedius. b. acute inferior MI 2001 s/p rescue PTCA/stent to [facility].  b. NSTEMI 02/2014: s/p LHC with DES to oLCx c. 09/2015 PCI with DES to left main/ostial LCx   Chronic bronchitis (HCC)    they say I get it q yr (10/03/2015)   CKD (chronic kidney disease), stage II    Depression    ED (erectile dysfunction)    GERD (gastroesophageal reflux disease)    Hyperlipidemia    Hypertension    Ischemic cardiomyopathy    a. EF 48% by nuc in 2012.   Myocardial infarction [facility]) several   Nephrolithiasis    Obesity    Prediabetes     Family History  Problem Relation Age of Onset   Coronary artery disease Mother        Died at age 22 of MI   Coronary artery disease Father        MI age 71    SOCIAL HISTORY: Social History  Tobacco Use   Smoking status: Never   Smokeless tobacco: Never  Substance Use Topics   Alcohol  use: No    Allergies  Allergen Reactions   Ciprofloxacin Hcl Other (See Comments)    Unknown    Peanut-Containing Drug Products Other (See Comments)    Unknown     Current Outpatient Medications  Medication Sig Dispense Refill   amiodarone  (PACERONE ) 200 MG tablet Take 1 tablet (200 mg total) by mouth daily. Pt. Takes 1 tablet daily. 90 tablet 3   amLODipine  (NORVASC ) 5 MG tablet Take 1 tablet (5 mg total) by mouth daily. 90 tablet 3   apixaban  (ELIQUIS ) 5 MG TABS tablet Take 1 tablet (5 mg total) by mouth daily. 60 tablet 11   atorvastatin  (LIPITOR ) 40 MG tablet Take 1 tablet (40 mg  total) by mouth at bedtime. 90 tablet 3   carvedilol  (COREG ) 12.5 MG tablet Take 1 tablet (12.5 mg total) by mouth 2 (two) times daily with a meal. 180 tablet 3   esomeprazole (NEXIUM) 40 MG capsule Take 40 mg by mouth daily.     furosemide  (LASIX ) 40 MG tablet Take 1 tablet (40 mg total) by mouth daily. 90 tablet 3   lidocaine  (LIDODERM ) 5 % Place 1 patch onto the skin daily. Remove & Discard patch within 12 hours or as directed by MD 30 patch 0   mirabegron  ER (MYRBETRIQ ) 50 MG TB24 tablet Take 50 mg by mouth at bedtime.     No current facility-administered medications for this visit.    REVIEW OF SYSTEMS:  [X]  denotes positive finding, [ ]  denotes negative finding Cardiac  Comments:  Chest pain or chest pressure:    Shortness of breath upon exertion:    Short of breath when lying flat:    Irregular heart rhythm:        Vascular    Pain in calf, thigh, or hip brought on by ambulation:    Pain in feet at night that wakes you up from your sleep:     Blood clot in your veins:    Leg swelling:         Pulmonary    Oxygen at home:    Productive cough:     Wheezing:         Neurologic    Sudden weakness in arms or legs:     Sudden numbness in arms or legs:     Sudden onset of difficulty speaking or slurred speech:    Temporary loss of vision in one eye:     Problems with dizziness:         Gastrointestinal    Blood in stool:     Vomited blood:         Genitourinary    Burning when urinating:     Blood in urine:        Psychiatric    Major depression:         Hematologic    Bleeding problems:    Problems with blood clotting too easily:        Skin    Rashes or ulcers:        Constitutional    Fever or chills:    -  PHYSICAL EXAM:   There were no vitals filed for this visit.  There is no height or weight on file to calculate BMI. GENERAL: The patient is a well-nourished male, in no acute distress. The vital signs are documented above. CARDIAC: There is a  regular rate and rhythm.  VASCULAR: He has palpable dorsalis pedis, pulses bilaterally. He has some hyperpigmentation consistent with chronic venous insufficiency. PULMONARY: Nonlabored breathing ABDOMEN: Soft and non-tender  MUSCULOSKELETAL: There are no major deformities. NEUROLOGIC: No focal weakness or paresthesias are detected. SKIN: There are no ulcers or rashes noted. PSYCHIATRIC: The patient has a normal affect.  DATA:    MRI: His 03/2022 MRI does not show an acute stroke.  CAROTID DUPLEX: I reviewed carotid duplex scan that was done while he was in the hospital.  He had a less than 39% carotid stenosis bilaterally.  Both vertebral arteries were patent with antegrade flow.    Fonda FORBES [facility]

## 2023-02-11 ENCOUNTER — Encounter: Payer: Self-pay | Admitting: Vascular Surgery

## 2023-02-11 ENCOUNTER — Ambulatory Visit: Payer: No Typology Code available for payment source | Admitting: Vascular Surgery

## 2023-02-11 ENCOUNTER — Ambulatory Visit (HOSPITAL_COMMUNITY)
Admission: RE | Admit: 2023-02-11 | Discharge: 2023-02-11 | Disposition: A | Discharge status: Home / Self Care | Payer: Medicare Other | Source: Ambulatory Visit | Attending: Vascular Surgery | Admitting: Vascular Surgery

## 2023-02-11 VITALS — BP 115/77 | HR 68 | Temp 98.4°F | Resp 20 | Ht 70.0 in | Wt 272.0 lb

## 2023-02-11 DIAGNOSIS — I6521 Occlusion and stenosis of right carotid artery: Secondary | ICD-10-CM | POA: Insufficient documentation

## 2023-02-11 DIAGNOSIS — I6523 Occlusion and stenosis of bilateral carotid arteries: Secondary | ICD-10-CM

## 2023-02-23 ENCOUNTER — Other Ambulatory Visit: Payer: Self-pay

## 2023-02-23 DIAGNOSIS — I6523 Occlusion and stenosis of bilateral carotid arteries: Secondary | ICD-10-CM

## 2023-07-26 ENCOUNTER — Telehealth (HOSPITAL_COMMUNITY): Payer: Self-pay

## 2023-07-26 NOTE — Telephone Encounter (Signed)
 Outside/paper referral received by Dr. Drenda Gentle from [facility]. Will pass to nurse navigator for review.  [facility] auth# [id number]  Contacted patient, he is interested in cardiac rehab program.

## 2023-07-30 ENCOUNTER — Telehealth (HOSPITAL_COMMUNITY): Payer: Self-pay

## 2023-07-30 ENCOUNTER — Encounter (HOSPITAL_COMMUNITY): Payer: Self-pay

## 2023-07-30 NOTE — Telephone Encounter (Signed)
Attempted to call patient in regards to Cardiac Rehab - LM on VM   Sent letter 

## 2023-08-17 ENCOUNTER — Telehealth (HOSPITAL_COMMUNITY): Payer: Self-pay

## 2023-08-17 NOTE — Telephone Encounter (Signed)
 Called patient to see if he was interested in participating in the Cardiac Rehab Program. Patient will come in for orientation on 07/15 and will attend the 11:45 exercise class.  Pensions consultant.

## 2023-08-23 ENCOUNTER — Other Ambulatory Visit (HOSPITAL_COMMUNITY): Payer: Self-pay

## 2023-08-23 ENCOUNTER — Telehealth (HOSPITAL_COMMUNITY): Payer: Self-pay

## 2023-08-23 NOTE — Telephone Encounter (Signed)
 LVM for patient to confirm Cardiac Rehab appointment tomorrow.

## 2023-08-24 ENCOUNTER — Telehealth (HOSPITAL_COMMUNITY): Payer: Self-pay | Admitting: *Deleted

## 2023-08-24 ENCOUNTER — Encounter (HOSPITAL_COMMUNITY)

## 2023-08-24 NOTE — Telephone Encounter (Signed)
 Mr Poland said that he forget about his appointment this morning. Patient would like to have his orientation appointment rescheduled.Hadassah Elpidio Quan RN BSN

## 2023-08-26 ENCOUNTER — Encounter (HOSPITAL_COMMUNITY)
Admission: RE | Admit: 2023-08-26 | Discharge: 2023-08-26 | Disposition: A | Discharge status: Home / Self Care | Source: Ambulatory Visit | Attending: Cardiology | Admitting: Cardiology

## 2023-08-26 VITALS — BP 102/62 | HR 74 | Ht 71.0 in | Wt 249.6 lb

## 2023-08-26 DIAGNOSIS — I219 Acute myocardial infarction, unspecified: Secondary | ICD-10-CM | POA: Diagnosis present

## 2023-08-26 DIAGNOSIS — Z951 Presence of aortocoronary bypass graft: Secondary | ICD-10-CM | POA: Insufficient documentation

## 2023-08-26 NOTE — Progress Notes (Addendum)
 Cardiac Rehab Medication Review   Does the patient  feel that his/her medications are working for him/her? Yes    Has the patient been experiencing any side effects to the medications prescribed? No  Does the patient measure his/her own blood pressure or blood glucose at home?   Yes  Does the patient have any problems obtaining medications due to transportation or finances?   No  Understanding of regimen: fair Understanding of indications: fair Potential of compliance: good    Comments: Nicklos does not understand his medications and regime very well. He has a list to help him with this. He checks his BP everyday and keeps a log of his BP and HR.    Con KATHEE Pereyra, MS, ACSM-CEP 08/26/2023 9:07 AM

## 2023-08-26 NOTE — Progress Notes (Signed)
 Cardiac Individual Treatment Plan  Patient Details  Name: William Matthews MRN: [id number] Date of Birth: [birth year] Referring Provider:   Flowsheet Row INTENSIVE CARDIAC REHAB ORIENT from 08/26/2023 in [facility]  Referring Provider Wilbert Bihari, MD    Initial Encounter Date:  Flowsheet Row INTENSIVE CARDIAC REHAB ORIENT from 08/26/2023 in [facility]  Date 08/26/23    Visit Diagnosis: Myocardial infarction, unspecified MI type, unspecified artery (HCC)  S/P CABG (coronary artery bypass graft)  Patient's Home Medications on Admission:  Current Outpatient Medications:    amiodarone  (PACERONE ) 200 MG tablet, Take 1 tablet (200 mg total) by mouth daily. Pt. Takes 1 tablet daily., Disp: 90 tablet, Rfl: 3   amLODipine  (NORVASC ) 5 MG tablet, Take 1 tablet (5 mg total) by mouth daily., Disp: 90 tablet, Rfl: 3   apixaban  (ELIQUIS ) 5 MG TABS tablet, Take 1 tablet (5 mg total) by mouth daily., Disp: 60 tablet, Rfl: 11   atorvastatin  (LIPITOR ) 40 MG tablet, Take 1 tablet (40 mg total) by mouth at bedtime., Disp: 90 tablet, Rfl: 3   carvedilol  (COREG ) 12.5 MG tablet, Take 1 tablet (12.5 mg total) by mouth 2 (two) times daily with a meal., Disp: 180 tablet, Rfl: 3   cholecalciferol (VITAMIN D3) 25 MCG (1000 UNIT) tablet, Take 1,000 Units by mouth daily., Disp: , Rfl:    dapagliflozin  propanediol (FARXIGA ) 10 MG TABS tablet, Take 10 mg by mouth daily., Disp: , Rfl:    DULoxetine  (CYMBALTA ) 20 MG capsule, Take 20 mg by mouth daily., Disp: , Rfl:    esomeprazole (NEXIUM) 40 MG capsule, Take 40 mg by mouth daily., Disp: , Rfl:    furosemide  (LASIX ) 40 MG tablet, Take 1 tablet (40 mg total) by mouth daily., Disp: 90 tablet, Rfl: 3   lidocaine  (LIDODERM ) 5 %, Place 1 patch onto the skin daily. Remove & Discard patch within 12 hours or as directed by MD, Disp: 30 patch, Rfl: 0   meclizine  (ANTIVERT) 25 MG tablet, Take 25 mg by mouth 3 (three) times daily as needed for dizziness., Disp: , Rfl:    mirabegron  ER (MYRBETRIQ ) 50 MG TB24 tablet, Take 50 mg by mouth at bedtime., Disp: , Rfl:    sacubitril -valsartan  (ENTRESTO ) 24-26 MG, Take 1 tablet by mouth 2 (two) times daily., Disp: , Rfl:    Semaglutide-Weight Management 2.4 MG/0.75ML SOAJ, Inject 2.4 mg into the skin once a week., Disp: , Rfl:    spironolactone (ALDACTONE) 25 MG tablet, Take 12.5 mg by mouth daily., Disp: , Rfl:    tamsulosin  (FLOMAX ) 0.4 MG CAPS capsule, Take 0.8 mg by mouth daily after supper., Disp: , Rfl:    torsemide (DEMADEX) 20 MG tablet, Take 20 mg by mouth daily., Disp: , Rfl:   Past Medical History: Past Medical History:  Diagnosis Date   Anxiety    CAD (coronary artery disease)    a. s/p CABG 1994 with LIMA to D2 and LAD, SVG to D1 and ramus intermedius. b. acute inferior MI 2001 s/p rescue PTCA/stent to [facility].  b. NSTEMI 02/2014: s/p LHC with DES to oLCx c. 09/2015 PCI with DES to left main/ostial LCx   Chronic bronchitis (HCC)    they say I get it q yr (10/03/2015)   CKD (chronic kidney disease), stage II    Depression    ED (erectile dysfunction)    GERD (gastroesophageal reflux disease)  Hyperlipidemia    Hypertension    Ischemic cardiomyopathy    a. EF 48% by nuc in 2012.   Myocardial infarction (HCC) several   Nephrolithiasis    Obesity    Prediabetes     Tobacco Use: Social History   Tobacco Use  Smoking Status Never  Smokeless Tobacco Never    Labs: Review Flowsheet  More data exists      Latest Ref Rng & Units 10/09/2010 02/18/2015 02/19/2015 10/04/2015 03/27/2022  Labs for ITP Cardiac and Pulmonary Rehab  Cholestrol 0 - 200 mg/dL 863  - 897  867  891   LDL (calc) 0 - 99 mg/dL 77  - 29  82  48   HDL-C >40 mg/dL 61.19  - 35  34  30   Trlycerides <150 mg/dL 896.9  - 807  78  851   Hemoglobin A1c 4.8 - 5.6 % - 5.6  - - 5.7     Capillary Blood Glucose: Lab Results   Component Value Date   GLUCAP 117 (H) 03/26/2022     Exercise Target Goals: Exercise Program Goal: Individual exercise prescription set using results from initial 6 min walk test and THRR while considering  patient's activity barriers and safety.   Exercise Prescription Goal: Initial exercise prescription builds to 30-45 minutes a day of aerobic activity, 2-3 days per week.  Home exercise guidelines will be given to patient during program as part of exercise prescription that the participant will acknowledge.  Activity Barriers & Risk Stratification:  Activity Barriers & Cardiac Risk Stratification - 08/26/23 0917       Activity Barriers & Cardiac Risk Stratification   Activity Barriers Joint Problems;Assistive Device;Deconditioning;Shortness of Breath;Decreased Ventricular Function;History of Falls;Balance Concerns    Cardiac Risk Stratification High   <5 METs on         6 Minute Walk:  6 Minute Walk     Row Name 08/26/23 1139         6 Minute Walk   Phase Initial     Distance 1620 feet     Walk Time 6 minutes     # of Rest Breaks 0     MPH 3.07     METS 3.07     RPE 11     Perceived Dyspnea  1     VO2 Peak 10.75     Symptoms Yes (comment)     Comments R hip pain 5/10- chronic, SOB. both resolved with rest.     Resting HR 74 bpm     Resting BP 102/62     Resting Oxygen Saturation  96 %     Exercise Oxygen Saturation  during 6 min walk 96 %     Max Ex. HR 109 bpm     Max Ex. BP 150/64     2 Minute Post BP 128/66        Oxygen Initial Assessment:   Oxygen Re-Evaluation:   Oxygen Discharge (Final Oxygen Re-Evaluation):   Initial Exercise Prescription:  Initial Exercise Prescription - 08/26/23 0900       Date of Initial Exercise RX and Referring Provider   Date 08/26/23    Referring Provider Wilbert Bihari, MD    Expected Discharge Date 11/17/23      Recumbant Bike   Level 1    RPM 50    Watts 30    Minutes 15    METs 2.3      NuStep    Level 2  SPM 70    Minutes 15    METs 2.5      Prescription Details   Frequency (times per week) 3    Duration Progress to 30 minutes of continuous aerobic without signs/symptoms of physical distress      Intensity   Ratings of Perceived Exertion 11-13    Perceived Dyspnea 0-4      Progression   Progression Continue progressive overload as per policy without signs/symptoms or physical distress.      Resistance Training   Training Prescription Yes    Weight 2    Reps 10-15          Perform Capillary Blood Glucose checks as needed.  Exercise Prescription Changes:   Exercise Comments:   Exercise Goals and Review:   Exercise Goals     Row Name 08/26/23 0903             Exercise Goals   Increase Physical Activity Yes       Intervention Provide advice, education, support and counseling about physical activity/exercise needs.;Develop an individualized exercise prescription for aerobic and resistive training based on initial evaluation findings, risk stratification, comorbidities and participant's personal goals.       Expected Outcomes Short Term: Attend rehab on a regular basis to increase amount of physical activity.;Long Term: Exercising regularly at least 3-5 days a week.;Long Term: Add in home exercise to make exercise part of routine and to increase amount of physical activity.       Increase Strength and Stamina Yes       Intervention Provide advice, education, support and counseling about physical activity/exercise needs.;Develop an individualized exercise prescription for aerobic and resistive training based on initial evaluation findings, risk stratification, comorbidities and participant's personal goals.       Expected Outcomes Short Term: Increase workloads from initial exercise prescription for resistance, speed, and METs.;Short Term: Perform resistance training exercises routinely during rehab and add in resistance training at home;Long Term: Improve  cardiorespiratory fitness, muscular endurance and strength as measured by increased METs and functional capacity ( )       Able to understand and use rate of perceived exertion (RPE) scale Yes       Intervention Provide education and explanation on how to use RPE scale       Expected Outcomes Short Term: Able to use RPE daily in rehab to express subjective intensity level;Long Term:  Able to use RPE to guide intensity level when exercising independently       Knowledge and understanding of Target Heart Rate Range (THRR) Yes       Intervention Provide education and explanation of THRR including how the numbers were predicted and where they are located for reference       Expected Outcomes Short Term: Able to state/look up THRR;Long Term: Able to use THRR to govern intensity when exercising independently;Short Term: Able to use daily as guideline for intensity in rehab       Understanding of Exercise Prescription Yes       Intervention Provide education, explanation, and written materials on patient's individual exercise prescription       Expected Outcomes Long Term: Able to explain home exercise prescription to exercise independently;Short Term: Able to explain program exercise prescription          Exercise Goals Re-Evaluation :   Discharge Exercise Prescription (Final Exercise Prescription Changes):   Nutrition:  Target Goals: Understanding of nutrition guidelines, daily intake of sodium 1500mg , cholesterol 200mg , calories 30%  from fat and 7% or less from saturated fats, daily to have 5 or more servings of fruits and vegetables.  Biometrics:  Pre Biometrics - 08/26/23 0905       Pre Biometrics   Waist Circumference 51 inches    Hip Circumference 45 inches    Waist to Hip Ratio 1.13 %    Triceps Skinfold 18 mm    % Body Fat 36 %    Grip Strength 21 kg    Flexibility 9.5 in    Single Leg Stand 2 seconds           Nutrition Therapy Plan and Nutrition Goals:   Nutrition  Assessments:  MEDIFICTS Score Key: >=70 Need to make dietary changes  40-70 Heart Healthy Diet <= 40 Therapeutic Level Cholesterol Diet    Picture Your Plate Scores: <59 Unhealthy dietary pattern with much room for improvement. 41-50 Dietary pattern unlikely to meet recommendations for good health and room for improvement. 51-60 More healthful dietary pattern, with some room for improvement.  >60 Healthy dietary pattern, although there may be some specific behaviors that could be improved.    Nutrition Goals Re-Evaluation:   Nutrition Goals Re-Evaluation:   Nutrition Goals Discharge (Final Nutrition Goals Re-Evaluation):   Psychosocial: Target Goals: Acknowledge presence or absence of significant depression and/or stress, maximize coping skills, provide positive support system. Participant is able to verbalize types and ability to use techniques and skills needed for reducing stress and depression.  Initial Review & Psychosocial Screening:  Initial Psych Review & Screening - 08/26/23 0920       Initial Review   Current issues with Current Psychotropic Meds      Family Dynamics   Good Support System? Yes   daughter   Comments Murad currently denies any feelings of depression/anxiety/stress. He does take cymbalta , however he is unsure why because he stated he does not have feelings of depression.      Barriers   Psychosocial barriers to participate in program There are no identifiable barriers or psychosocial needs.      Screening Interventions   Interventions Encouraged to exercise;Provide feedback about the scores to participant    Expected Outcomes Long Term goal: The participant improves quality of Life and PHQ9 Scores as seen by post scores and/or verbalization of changes;Short Term goal: Identification and review with participant of any Quality of Life or Depression concerns found by scoring the questionnaire.          Quality of Life Scores:  Quality of Life -  08/26/23 1142       Quality of Life   Select Quality of Life      Quality of Life Scores   Health/Function Pre 21.9 %    Socioeconomic Pre 22.31 %    Psych/Spiritual Pre 23.57 %    Family Pre 20.2 %    GLOBAL Pre 22.09 %         Scores of 19 and below usually indicate a poorer quality of life in these areas.  A difference of  2-3 points is a clinically meaningful difference.  A difference of 2-3 points in the total score of the Quality of Life Index has been associated with significant improvement in overall quality of life, self-image, physical symptoms, and general health in studies assessing change in quality of life.  PHQ-9: Review Flowsheet       08/26/2023 02/26/2015  Depression screen PHQ 2/9  Decreased Interest 0 0  Down, Depressed, Hopeless 0 0  PHQ -  2 Score 0 0  Altered sleeping 0 -  Tired, decreased energy 0 -  Change in appetite 0 -  Feeling bad or failure about yourself  0 -  Trouble concentrating 0 -  Moving slowly or fidgety/restless 0 -  Suicidal thoughts 0 -  PHQ-9 Score 0 -   Interpretation of Total Score  Total Score Depression Severity:  1-4 = Minimal depression, 5-9 = Mild depression, 10-14 = Moderate depression, 15-19 = Moderately severe depression, 20-27 = Severe depression   Psychosocial Evaluation and Intervention:   Psychosocial Re-Evaluation:   Psychosocial Discharge (Final Psychosocial Re-Evaluation):   Vocational Rehabilitation: Provide vocational rehab assistance to qualifying candidates.   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 Participant will state understanding/return demonstration of topics presented.     Core  Videos: Exercise    Move It!  Clinical staff conducted group or individual video education with verbal and written material and guidebook.  Patient learns the recommended Pritikin exercise program. Exercise with the goal of living a long, healthy life. Some of the health benefits of exercise include controlled diabetes, healthier blood pressure levels, improved cholesterol levels, improved heart and lung capacity, improved sleep, and better body composition. Everyone should speak with their doctor before starting or changing an exercise routine.  Biomechanical Limitations Clinical staff conducted group or individual video education with verbal and written material and guidebook.  Patient learns how biomechanical limitations can impact exercise and how we can mitigate and possibly overcome limitations to have an impactful and balanced exercise routine.  Body Composition Clinical staff conducted group or individual video education with verbal and written material and guidebook.  Patient learns that body composition (ratio of muscle mass to fat mass) is a key component to assessing overall fitness, rather than body weight alone. Increased fat mass, especially visceral belly fat, can put us  at increased risk for metabolic syndrome, type 2 diabetes, heart disease, and even death. It is recommended to combine diet and exercise (cardiovascular and resistance training) to improve your body composition. Seek guidance from your physician and exercise physiologist before implementing an exercise routine.  Exercise Action Plan Clinical staff conducted group or individual video education with verbal and written material and guidebook.  Patient learns the recommended strategies to achieve and enjoy long-term exercise adherence, including variety, self-motivation, self-efficacy, and positive decision making. Benefits of exercise include fitness, good health, weight management, more energy, better sleep, less  stress, and overall well-being.  Medical   Heart Disease Risk Reduction Clinical staff conducted group or individual video education with verbal and written material and guidebook.  Patient learns our heart is our most vital organ as it circulates oxygen, nutrients, white blood cells, and hormones throughout the entire body, and carries waste away. Data supports a plant-based eating plan like the Pritikin Program for its effectiveness in slowing progression of and reversing heart disease. The video provides a number of recommendations to address heart disease.   Metabolic Syndrome and Belly Fat  Clinical staff conducted group or individual video education with verbal and written material and guidebook.  Patient learns what metabolic syndrome is, how it leads to heart disease, and how one can reverse it and keep  it from coming back. You have metabolic syndrome if you have 3 of the following 5 criteria: abdominal obesity, high blood pressure, high triglycerides, low HDL cholesterol, and high blood sugar.  Hypertension and Heart Disease Clinical staff conducted group or individual video education with verbal and written material and guidebook.  Patient learns that high blood pressure, or hypertension, is very common in the [place] . Hypertension is largely due to excessive salt intake, but other important risk factors include being overweight, physical inactivity, drinking too much alcohol , smoking, and not eating enough potassium from fruits and vegetables. High blood pressure is a leading risk factor for heart attack, stroke, congestive heart failure, dementia, kidney failure, and premature death. Long-term effects of excessive salt intake include stiffening of the arteries and thickening of heart muscle and organ damage. Recommendations include ways to reduce hypertension and the risk of heart disease.  Diseases of Our Time - Focusing on Diabetes Clinical staff conducted group or individual  video education with verbal and written material and guidebook.  Patient learns why the best way to stop diseases of our time is prevention, through food and other lifestyle changes. Medicine (such as prescription pills and surgeries) is often only a Band-Aid on the problem, not a long-term solution. Most common diseases of our time include obesity, type 2 diabetes, hypertension, heart disease, and cancer. The Pritikin Program is recommended and has been proven to help reduce, reverse, and/or prevent the damaging effects of metabolic syndrome.  Nutrition   Overview of the Pritikin Eating Plan  Clinical staff conducted group or individual video education with verbal and written material and guidebook.  Patient learns about the Pritikin Eating Plan for disease risk reduction. The Pritikin Eating Plan emphasizes a wide variety of unrefined, minimally-processed carbohydrates, like fruits, vegetables, whole grains, and legumes. Go, Caution, and Stop food choices are explained. Plant-based and lean animal proteins are emphasized. Rationale provided for low sodium intake for blood pressure control, low added sugars for blood sugar stabilization, and low added fats and oils for coronary artery disease risk reduction and weight management.  Calorie Density  Clinical staff conducted group or individual video education with verbal and written material and guidebook.  Patient learns about calorie density and how it impacts the Pritikin Eating Plan. Knowing the characteristics of the food you choose will help you decide whether those foods will lead to weight gain or weight loss, and whether you want to consume more or less of them. Weight loss is usually a side effect of the Pritikin Eating Plan because of its focus on low calorie-dense foods.  Label Reading  Clinical staff conducted group or individual video education with verbal and written material and guidebook.  Patient learns about the Pritikin recommended  label reading guidelines and corresponding recommendations regarding calorie density, added sugars, sodium content, and whole grains.  Dining Out - Part 1  Clinical staff conducted group or individual video education with verbal and written material and guidebook.  Patient learns that restaurant meals can be sabotaging because they can be so high in calories, fat, sodium, and/or sugar. Patient learns recommended strategies on how to positively address this and avoid unhealthy pitfalls.  Facts on Fats  Clinical staff conducted group or individual video education with verbal and written material and guidebook.  Patient learns that lifestyle modifications can be just as effective, if not more so, as many medications for lowering your risk of heart disease. A Pritikin lifestyle can help to reduce your risk of inflammation  and atherosclerosis (cholesterol build-up, or plaque, in the artery walls). Lifestyle interventions such as dietary choices and physical activity address the cause of atherosclerosis. A review of the types of fats and their impact on blood cholesterol levels, along with dietary recommendations to reduce fat intake is also included.  Nutrition Action Plan  Clinical staff conducted group or individual video education with verbal and written material and guidebook.  Patient learns how to incorporate Pritikin recommendations into their lifestyle. Recommendations include planning and keeping personal health goals in mind as an important part of their success.  Healthy Mind-Set    Healthy Minds, Bodies, Hearts  Clinical staff conducted group or individual video education with verbal and written material and guidebook.  Patient learns how to identify when they are stressed. Video will discuss the impact of that stress, as well as the many benefits of stress management. Patient will also be introduced to stress management techniques. The way we think, act, and feel has an impact on our  hearts.  How Our Thoughts Can Heal Our Hearts  Clinical staff conducted group or individual video education with verbal and written material and guidebook.  Patient learns that negative thoughts can cause depression and anxiety. This can result in negative lifestyle behavior and serious health problems. Cognitive behavioral therapy is an effective method to help control our thoughts in order to change and improve our emotional outlook.  Additional Videos:  Exercise    Improving Performance  Clinical staff conducted group or individual video education with verbal and written material and guidebook.  Patient learns to use a non-linear approach by alternating intensity levels and lengths of time spent exercising to help burn more calories and lose more body fat. Cardiovascular exercise helps improve heart health, metabolism, hormonal balance, blood sugar control, and recovery from fatigue. Resistance training improves strength, endurance, balance, coordination, reaction time, metabolism, and muscle mass. Flexibility exercise improves circulation, posture, and balance. Seek guidance from your physician and exercise physiologist before implementing an exercise routine and learn your capabilities and proper form for all exercise.  Introduction to Yoga  Clinical staff conducted group or individual video education with verbal and written material and guidebook.  Patient learns about yoga, a discipline of the coming together of mind, breath, and body. The benefits of yoga include improved flexibility, improved range of motion, better posture and core strength, increased lung function, weight loss, and positive self-image. Yoga's heart health benefits include lowered blood pressure, healthier heart rate, decreased cholesterol and triglyceride levels, improved immune function, and reduced stress. Seek guidance from your physician and exercise physiologist before implementing an exercise routine and learn your  capabilities and proper form for all exercise.  Medical   Aging: Enhancing Your Quality of Life  Clinical staff conducted group or individual video education with verbal and written material and guidebook.  Patient learns key strategies and recommendations to stay in good physical health and enhance quality of life, such as prevention strategies, having an advocate, securing a Health Care Proxy and Power of Attorney, and keeping a list of medications and system for tracking them. It also discusses how to avoid risk for bone loss.  Biology of Weight Control  Clinical staff conducted group or individual video education with verbal and written material and guidebook.  Patient learns that weight gain occurs because we consume more calories than we burn (eating more, moving less). Even if your body weight is normal, you may have higher ratios of fat compared to muscle mass. Too much  body fat puts you at increased risk for cardiovascular disease, heart attack, stroke, type 2 diabetes, and obesity-related cancers. In addition to exercise, following the Pritikin Eating Plan can help reduce your risk.  Decoding Lab Results  Clinical staff conducted group or individual video education with verbal and written material and guidebook.  Patient learns that lab test reflects one measurement whose values change over time and are influenced by many factors, including medication, stress, sleep, exercise, food, hydration, pre-existing medical conditions, and more. It is recommended to use the knowledge from this video to become more involved with your lab results and evaluate your numbers to speak with your doctor.   Diseases of Our Time - Overview  Clinical staff conducted group or individual video education with verbal and written material and guidebook.  Patient learns that according to the CDC, 50% to 70% of chronic diseases (such as obesity, type 2 diabetes, elevated lipids, hypertension, and heart disease) are  avoidable through lifestyle improvements including healthier food choices, listening to satiety cues, and increased physical activity.  Sleep Disorders Clinical staff conducted group or individual video education with verbal and written material and guidebook.  Patient learns how good quality and duration of sleep are important to overall health and well-being. Patient also learns about sleep disorders and how they impact health along with recommendations to address them, including discussing with a physician.  Nutrition  Dining Out - Part 2 Clinical staff conducted group or individual video education with verbal and written material and guidebook.  Patient learns how to plan ahead and communicate in order to maximize their dining experience in a healthy and nutritious manner. Included are recommended food choices based on the type of restaurant the patient is visiting.   Fueling a Banker conducted group or individual video education with verbal and written material and guidebook.  There is a strong connection between our food choices and our health. Diseases like obesity and type 2 diabetes are very prevalent and are in large-part due to lifestyle choices. The Pritikin Eating Plan provides plenty of food and hunger-curbing satisfaction. It is easy to follow, affordable, and helps reduce health risks.  Menu Workshop  Clinical staff conducted group or individual video education with verbal and written material and guidebook.  Patient learns that restaurant meals can sabotage health goals because they are often packed with calories, fat, sodium, and sugar. Recommendations include strategies to plan ahead and to communicate with the manager, chef, or server to help order a healthier meal.  Planning Your Eating Strategy  Clinical staff conducted group or individual video education with verbal and written material and guidebook.  Patient learns about the Pritikin Eating Plan and  its benefit of reducing the risk of disease. The Pritikin Eating Plan does not focus on calories. Instead, it emphasizes high-quality, nutrient-rich foods. By knowing the characteristics of the foods, we choose, we can determine their calorie density and make informed decisions.  Targeting Your Nutrition Priorities  Clinical staff conducted group or individual video education with verbal and written material and guidebook.  Patient learns that lifestyle habits have a tremendous impact on disease risk and progression. This video provides eating and physical activity recommendations based on your personal health goals, such as reducing LDL cholesterol, losing weight, preventing or controlling type 2 diabetes, and reducing high blood pressure.  Vitamins and Minerals  Clinical staff conducted group or individual video education with verbal and written material and guidebook.  Patient learns different ways to  obtain key vitamins and minerals, including through a recommended healthy diet. It is important to discuss all supplements you take with your doctor.   Healthy Mind-Set    Smoking Cessation  Clinical staff conducted group or individual video education with verbal and written material and guidebook.  Patient learns that cigarette smoking and tobacco addiction pose a serious health risk which affects millions of people. Stopping smoking will significantly reduce the risk of heart disease, lung disease, and many forms of cancer. Recommended strategies for quitting are covered, including working with your doctor to develop a successful plan.  Culinary   Becoming a Set designer conducted group or individual video education with verbal and written material and guidebook.  Patient learns that cooking at home can be healthy, cost-effective, quick, and puts them in control. Keys to cooking healthy recipes will include looking at your recipe, assessing your equipment needs, planning ahead,  making it simple, choosing cost-effective seasonal ingredients, and limiting the use of added fats, salts, and sugars.  Cooking - Breakfast and Snacks  Clinical staff conducted group or individual video education with verbal and written material and guidebook.  Patient learns how important breakfast is to satiety and nutrition through the entire day. Recommendations include key foods to eat during breakfast to help stabilize blood sugar levels and to prevent overeating at meals later in the day. Planning ahead is also a key component.  Cooking - Educational psychologist conducted group or individual video education with verbal and written material and guidebook.  Patient learns eating strategies to improve overall health, including an approach to cook more at home. Recommendations include thinking of animal protein as a side on your plate rather than center stage and focusing instead on lower calorie dense options like vegetables, fruits, whole grains, and plant-based proteins, such as beans. Making sauces in large quantities to freeze for later and leaving the skin on your vegetables are also recommended to maximize your experience.  Cooking - Healthy Salads and Dressing Clinical staff conducted group or individual video education with verbal and written material and guidebook.  Patient learns that vegetables, fruits, whole grains, and legumes are the foundations of the Pritikin Eating Plan. Recommendations include how to incorporate each of these in flavorful and healthy salads, and how to create homemade salad dressings. Proper handling of ingredients is also covered. Cooking - Soups and [employer] - Soups and Desserts Clinical staff conducted group or individual video education with verbal and written material and guidebook.  Patient learns that Pritikin soups and desserts make for easy, nutritious, and delicious snacks and meal components that are low in sodium, fat, sugar, and  calorie density, while high in vitamins, minerals, and filling fiber. Recommendations include simple and healthy ideas for soups and desserts.   Overview     The Pritikin Solution Program Overview Clinical staff conducted group or individual video education with verbal and written material and guidebook.  Patient learns that the results of the Pritikin Program have been documented in more than 100 articles published in peer-reviewed journals, and the benefits include reducing risk factors for (and, in some cases, even reversing) high cholesterol, high blood pressure, type 2 diabetes, obesity, and more! An overview of the three key pillars of the Pritikin Program will be covered: eating well, doing regular exercise, and having a healthy mind-set.  WORKSHOPS  Exercise: Exercise Basics: Building Your Action Plan Clinical staff led group instruction and group discussion with PowerPoint presentation  and patient guidebook. To enhance the learning environment the use of posters, models and videos may be added. At the conclusion of this workshop, patients will comprehend the difference between physical activity and exercise, as well as the benefits of incorporating both, into their routine. Patients will understand the FITT (Frequency, Intensity, Time, and Type) principle and how to use it to build an exercise action plan. In addition, safety concerns and other considerations for exercise and cardiac rehab will be addressed by the presenter. The purpose of this lesson is to promote a comprehensive and effective weekly exercise routine in order to improve patients' overall level of fitness.   Managing Heart Disease: Your Path to a Healthier Heart Clinical staff led group instruction and group discussion with PowerPoint presentation and patient guidebook. To enhance the learning environment the use of posters, models and videos may be added.At the conclusion of this workshop, patients will understand the  anatomy and physiology of the heart. Additionally, they will understand how Pritikin's three pillars impact the risk factors, the progression, and the management of heart disease.  The purpose of this lesson is to provide a high-level overview of the heart, heart disease, and how the Pritikin lifestyle positively impacts risk factors.  Exercise Biomechanics Clinical staff led group instruction and group discussion with PowerPoint presentation and patient guidebook. To enhance the learning environment the use of posters, models and videos may be added. Patients will learn how the structural parts of their bodies function and how these functions impact their daily activities, movement, and exercise. Patients will learn how to promote a neutral spine, learn how to manage pain, and identify ways to improve their physical movement in order to promote healthy living. The purpose of this lesson is to expose patients to common physical limitations that impact physical activity. Participants will learn practical ways to adapt and manage aches and pains, and to minimize their effect on regular exercise. Patients will learn how to maintain good posture while sitting, walking, and lifting.  Balance Training and Fall Prevention  Clinical staff led group instruction and group discussion with PowerPoint presentation and patient guidebook. To enhance the learning environment the use of posters, models and videos may be added. At the conclusion of this workshop, patients will understand the importance of their sensorimotor skills (vision, proprioception, and the vestibular system) in maintaining their ability to balance as they age. Patients will apply a variety of balancing exercises that are appropriate for their current level of function. Patients will understand the common causes for poor balance, possible solutions to these problems, and ways to modify their physical environment in order to minimize their  fall risk. The purpose of this lesson is to teach patients about the importance of maintaining balance as they age and ways to minimize their risk of falling.  WORKSHOPS   Nutrition:  Fueling a Ship broker led group instruction and group discussion with PowerPoint presentation and patient guidebook. To enhance the learning environment the use of posters, models and videos may be added. Patients will review the foundational principles of the Pritikin Eating Plan and understand what constitutes a serving size in each of the food groups. Patients will also learn Pritikin-friendly foods that are better choices when away from home and review make-ahead meal and snack options. Calorie density will be reviewed and applied to three nutrition priorities: weight maintenance, weight loss, and weight gain. The purpose of this lesson is to reinforce (in a group setting) the key concepts around what  patients are recommended to eat and how to apply these guidelines when away from home by planning and selecting Pritikin-friendly options. Patients will understand how calorie density may be adjusted for different weight management goals.  Mindful Eating  Clinical staff led group instruction and group discussion with PowerPoint presentation and patient guidebook. To enhance the learning environment the use of posters, models and videos may be added. Patients will briefly review the concepts of the Pritikin Eating Plan and the importance of low-calorie dense foods. The concept of mindful eating will be introduced as well as the importance of paying attention to internal hunger signals. Triggers for non-hunger eating and techniques for dealing with triggers will be explored. The purpose of this lesson is to provide patients with the opportunity to review the basic principles of the Pritikin Eating Plan, discuss the value of eating mindfully and how to measure internal cues of hunger and fullness using the  Hunger Scale. Patients will also discuss reasons for non-hunger eating and learn strategies to use for controlling emotional eating.  Targeting Your Nutrition Priorities Clinical staff led group instruction and group discussion with PowerPoint presentation and patient guidebook. To enhance the learning environment the use of posters, models and videos may be added. Patients will learn how to determine their genetic susceptibility to disease by reviewing their family history. Patients will gain insight into the importance of diet as part of an overall healthy lifestyle in mitigating the impact of genetics and other environmental insults. The purpose of this lesson is to provide patients with the opportunity to assess their personal nutrition priorities by looking at their family history, their own health history and current risk factors. Patients will also be able to discuss ways of prioritizing and modifying the Pritikin Eating Plan for their highest risk areas  Menu  Clinical staff led group instruction and group discussion with PowerPoint presentation and patient guidebook. To enhance the learning environment the use of posters, models and videos may be added. Using menus brought in from [employer], or printed from [employer], patients will apply the Pritikin dining out guidelines that were presented in the [employer] video. Patients will also be able to practice these guidelines in a variety of provided scenarios. The purpose of this lesson is to provide patients with the opportunity to practice hands-on learning of the Pritikin Dining Out guidelines with actual menus and practice scenarios.  Label Reading Clinical staff led group instruction and group discussion with PowerPoint presentation and patient guidebook. To enhance the learning environment the use of posters, models and videos may be added. Patients will review and discuss the Pritikin label reading guidelines  presented in Pritikin's Label Reading Educational series video. Using fool labels brought in from local grocery stores and markets, patients will apply the label reading guidelines and determine if the packaged food meet the Pritikin guidelines. The purpose of this lesson is to provide patients with the opportunity to review, discuss, and practice hands-on learning of the Pritikin Label Reading guidelines with actual packaged food labels. Cooking School  Pritikin's [employer] are designed to teach patients ways to prepare quick, simple, and affordable recipes at home. The importance of nutrition's role in chronic disease risk reduction is reflected in its emphasis in the overall Pritikin program. By learning how to prepare essential core Pritikin Eating Plan recipes, patients will increase control over what they eat; be able to customize the flavor of foods without the use of added salt, sugar, or fat;  and improve the quality of the food they consume. By learning a set of core recipes which are easily assembled, quickly prepared, and affordable, patients are more likely to prepare more healthy foods at home. These workshops focus on convenient breakfasts, simple entres, side dishes, and desserts which can be prepared with minimal effort and are consistent with nutrition recommendations for cardiovascular risk reduction. Cooking [employer] are taught by a Armed forces logistics/support/administrative officer (RD) who has been trained by the [employer]. The chef or RD has a clear understanding of the importance of minimizing - if not completely eliminating - added fat, sugar, and sodium in recipes. Throughout the series of Cooking School Workshop sessions, patients will learn about healthy ingredients and efficient methods of cooking to build confidence in their capability to prepare    Cooking School weekly topics:  Adding Flavor- Sodium-Free  Fast and Healthy Breakfasts  Powerhouse Plant-Based  Proteins  Satisfying Salads and Dressings  Simple Sides and Sauces  International Cuisine-Spotlight on the [employer] Zones  Delicious Desserts  Savory Soups  [employer] - Meals in a Astronomer Appetizers and Snacks  Comforting Weekend Breakfasts  One-Pot Wonders   Fast Evening Meals  Landscape architect Your Pritikin Plate  WORKSHOPS   Healthy Mindset (Psychosocial):  Focused Goals, Sustainable Changes Clinical staff led group instruction and group discussion with PowerPoint presentation and patient guidebook. To enhance the learning environment the use of posters, models and videos may be added. Patients will be able to apply effective goal setting strategies to establish at least one personal goal, and then take consistent, meaningful action toward that goal. They will learn to identify common barriers to achieving personal goals and develop strategies to overcome them. Patients will also gain an understanding of how our mind-set can impact our ability to achieve goals and the importance of cultivating a positive and growth-oriented mind-set. The purpose of this lesson is to provide patients with a deeper understanding of how to set and achieve personal goals, as well as the tools and strategies needed to overcome common obstacles which may arise along the way.  From Head to Heart: The Power of a Healthy Outlook  Clinical staff led group instruction and group discussion with PowerPoint presentation and patient guidebook. To enhance the learning environment the use of posters, models and videos may be added. Patients will be able to recognize and describe the impact of emotions and mood on physical health. They will discover the importance of self-care and explore self-care practices which may work for them. Patients will also learn how to utilize the 4 C's to cultivate a healthier outlook and better manage stress and challenges. The purpose of this lesson is to demonstrate  to patients how a healthy outlook is an essential part of maintaining good health, especially as they continue their cardiac rehab journey.  Healthy Sleep for a Healthy Heart Clinical staff led group instruction and group discussion with PowerPoint presentation and patient guidebook. To enhance the learning environment the use of posters, models and videos may be added. At the conclusion of this workshop, patients will be able to demonstrate knowledge of the importance of sleep to overall health, well-being, and quality of life. They will understand the symptoms of, and treatments for, common sleep disorders. Patients will also be able to identify daytime and nighttime behaviors which impact sleep, and they will be able to apply these tools to help manage sleep-related challenges. The purpose of this lesson is  to provide patients with a general overview of sleep and outline the importance of quality sleep. Patients will learn about a few of the most common sleep disorders. Patients will also be introduced to the concept of "sleep hygiene," and discover ways to self-manage certain sleeping problems through simple daily behavior changes. Finally, the workshop will motivate patients by clarifying the links between quality sleep and their goals of heart-healthy living.   Recognizing and Reducing Stress Clinical staff led group instruction and group discussion with PowerPoint presentation and patient guidebook. To enhance the learning environment the use of posters, models and videos may be added. At the conclusion of this workshop, patients will be able to understand the types of stress reactions, differentiate between acute and chronic stress, and recognize the impact that chronic stress has on their health. They will also be able to apply different coping mechanisms, such as reframing negative self-talk. Patients will have the opportunity to practice a variety of stress management techniques, such as deep  abdominal breathing, progressive muscle relaxation, and/or guided imagery.  The purpose of this lesson is to educate patients on the role of stress in their lives and to provide healthy techniques for coping with it.  Learning Barriers/Preferences:  Learning Barriers/Preferences - 08/26/23 9076       Learning Barriers/Preferences   Learning Barriers Sight   glasses   Learning Preferences Group Instruction;Individual Instruction;Skilled Demonstration;Verbal Instruction;Video          Education Topics:  Knowledge Questionnaire Score:  Knowledge Questionnaire Score - 08/26/23 0924       Knowledge Questionnaire Score   Pre Score 23/24          Core Components/Risk Factors/Patient Goals at Admission:  Personal Goals and Risk Factors at Admission - 08/26/23 0924       Core Components/Risk Factors/Patient Goals on Admission    Weight Management Yes;Obesity    Intervention Weight Management: Develop a combined nutrition and exercise program designed to reach desired caloric intake, while maintaining appropriate intake of nutrient and fiber, sodium and fats, and appropriate energy expenditure required for the weight goal.;Weight Management: Provide education and appropriate resources to help participant work on and attain dietary goals.;Obesity: Provide education and appropriate resources to help participant work on and attain dietary goals.;Weight Management/Obesity: Establish reasonable short term and long term weight goals.    Admit Weight 249 lb 9 oz (113.2 kg)    Goal Weight: Long Term 200 lb (90.7 kg)   pt goal   Expected Outcomes Short Term: Continue to assess and modify interventions until short term weight is achieved;Long Term: Adherence to nutrition and physical activity/exercise program aimed toward attainment of established weight goal;Weight Loss: Understanding of general recommendations for a balanced deficit meal plan, which promotes 1-2 lb weight loss per week and includes  a negative energy balance of [phone] kcal/d;Understanding of distribution of calorie intake throughout the day with the consumption of 4-5 meals/snacks;Understanding recommendations for meals to include 15-35% energy as protein, 25-35% energy from fat, 35-60% energy from carbohydrates, less than 200mg  of dietary cholesterol, 20-35 gm of total fiber daily    Heart Failure Yes    Intervention Provide a combined exercise and nutrition program that is supplemented with education, support and counseling about heart failure. Directed toward relieving symptoms such as shortness of breath, decreased exercise tolerance, and extremity edema.    Expected Outcomes Improve functional capacity of life;Short term: Attendance in program 2-3 days a week with increased exercise capacity. Reported lower sodium intake. Reported  increased fruit and vegetable intake. Reports medication compliance.;Short term: Daily weights obtained and reported for increase. Utilizing diuretic protocols set by physician.;Long term: Adoption of self-care skills and reduction of barriers for early signs and symptoms recognition and intervention leading to self-care maintenance.    Hypertension Yes    Intervention Monitor prescription use compliance.;Provide education on lifestyle modifcations including regular physical activity/exercise, weight management, moderate sodium restriction and increased consumption of fresh fruit, vegetables, and low fat dairy, alcohol  moderation, and smoking cessation.    Expected Outcomes Long Term: Maintenance of blood pressure at goal levels.;Short Term: Continued assessment and intervention until BP is < 140/48mm HG in hypertensive participants. < 130/55mm HG in hypertensive participants with diabetes, heart failure or chronic kidney disease.    Lipids Yes    Intervention Provide education and support for participant on nutrition & aerobic/resistive exercise along with prescribed medications to achieve LDL 70mg ,  HDL >40mg .    Expected Outcomes Short Term: Participant states understanding of desired cholesterol values and is compliant with medications prescribed. Participant is following exercise prescription and nutrition guidelines.;Long Term: Cholesterol controlled with medications as prescribed, with individualized exercise RX and with personalized nutrition plan. Value goals: LDL < 70mg , HDL > 40 mg.          Core Components/Risk Factors/Patient Goals Review:    Core Components/Risk Factors/Patient Goals at Discharge (Final Review):    ITP Comments:  ITP Comments     Row Name 08/26/23 0832           ITP Comments Wilbert Bihari, MD: Medical Director. Introduction to the [facility] program/Intensive Cardaic Rehab. Initial orientation packet reviewed with the patient          Comments: Participant attended orientation for the cardiac rehabilitation program on  08/26/2023  to perform initial intake and exercise walk test. Patient introduced to the [facility] education and orientation packet was reviewed. Completed 6-minute walk test, measurements, initial ITP, and exercise prescription. Vital signs stable. Telemetry-normal sinus rhythm, asymptomatic. Mild SOB and 5/10 R Hip Chronic Pain post , resolved with rest.   Service time was from 0802 to 1005.  Con KATHEE Pereyra, MS, ACSM-CEP 08/26/2023 11:43 AM

## 2023-08-30 ENCOUNTER — Encounter (HOSPITAL_COMMUNITY)
Admission: RE | Admit: 2023-08-30 | Discharge: 2023-08-30 | Disposition: A | Discharge status: Home / Self Care | Source: Ambulatory Visit | Attending: Cardiology | Admitting: Cardiology

## 2023-08-30 DIAGNOSIS — I219 Acute myocardial infarction, unspecified: Secondary | ICD-10-CM

## 2023-08-30 DIAGNOSIS — Z951 Presence of aortocoronary bypass graft: Secondary | ICD-10-CM

## 2023-08-30 NOTE — Progress Notes (Signed)
 Daily Session Note  Patient Details  Name: William Matthews MRN: [id number] Date of Birth: [birth year] Referring Provider:   Flowsheet Row INTENSIVE CARDIAC REHAB ORIENT from 08/26/2023 in [facility]  Referring Provider Wilbert Bihari, MD    Encounter Date: 08/30/2023  Check In:  Session Check In - 08/30/23 1146       Check-In   Supervising physician immediately available to respond to emergencies [facility] MD immediately available    Physician(s) Rosaline Bane, NP    Location [facility]    Staff Present Con Pereyra, MS, Exercise Physiologist;Olinty Valere, MS, ACSM-CEP, Exercise Physiologist;David Janann, MS, ACSM-CEP, CCRP, Exercise Physiologist;Jetta Vannie BS, ACSM-CEP, Exercise Physiologist;Other;Maria Whitaker, RN, BSN    Virtual Visit No    Medication changes reported     No    Fall or balance concerns reported    No    Tobacco Cessation No Change    Warm-up and Cool-down Performed as group-led instruction    Resistance Training Performed Yes    VAD Patient? No    PAD/SET Patient? No      Pain Assessment   Currently in Pain? No/denies    Pain Score 0-No pain    Multiple Pain Sites No          Capillary Blood Glucose: No results found for this or any previous visit (from the past 24 hours).   Exercise Prescription Changes - 08/30/23 1600       Response to Exercise   Blood Pressure (Admit) 96/60    Blood Pressure (Exercise) 118/70    Blood Pressure (Exit) 92/62    Heart Rate (Admit) 70 bpm    Heart Rate (Exercise) 93 bpm    Heart Rate (Exit) 76 bpm    Rating of Perceived Exertion (Exercise) 11    Symptoms None    Comments Pt's first day in the [facility] program    Duration Continue with 30 min of aerobic exercise without signs/symptoms of physical distress.    Intensity THRR unchanged      Progression   Progression Continue to follow PAD protocol    Average METs 1.75      Resistance  Training   Training Prescription Yes    Weight 2 lbs    Reps 10-15    Time 5 Minutes      Interval Training   Interval Training No      Recumbant Bike   Level 1    RPM 34    Watts 60    Minutes 15    METs 1.7      NuStep   Level 2    SPM 70    Minutes 15    METs 1.8          Social History   Tobacco Use  Smoking Status Never  Smokeless Tobacco Never    Goals Met:  Exercise tolerated well No report of concerns or symptoms today Strength training completed today  Goals Unmet:  Not Applicable  Comments: Pt started cardiac rehab today.  Pt tolerated light exercise without difficulty. 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  Medication list reconciled. Pt denies barriers to medication compliance.  PSYCHOSOCIAL ASSESSMENT:  PHQ-0. Pt exhibits positive coping skills, hopeful outlook with supportive family. No psychosocial needs identified at this time, no psychosocial interventions necessary.    Pt enjoys watching TV, [employer], going out to eat, time with daughter & granddaughter.   Pt  oriented to exercise equipment and routine.    Understanding verbalized.     Dr. Wilbert Bihari is Medical Director for Cardiac Rehab at [facility] [place].

## 2023-09-01 ENCOUNTER — Encounter (HOSPITAL_COMMUNITY)
Admission: RE | Admit: 2023-09-01 | Discharge: 2023-09-01 | Disposition: A | Discharge status: Home / Self Care | Source: Ambulatory Visit | Attending: Cardiology | Admitting: Cardiology

## 2023-09-01 DIAGNOSIS — I219 Acute myocardial infarction, unspecified: Secondary | ICD-10-CM

## 2023-09-01 DIAGNOSIS — Z951 Presence of aortocoronary bypass graft: Secondary | ICD-10-CM

## 2023-09-03 ENCOUNTER — Encounter (HOSPITAL_COMMUNITY)

## 2023-09-06 ENCOUNTER — Encounter (HOSPITAL_COMMUNITY)
Admission: RE | Admit: 2023-09-06 | Discharge: 2023-09-06 | Disposition: A | Discharge status: Home / Self Care | Source: Ambulatory Visit | Attending: Cardiology

## 2023-09-06 DIAGNOSIS — I219 Acute myocardial infarction, unspecified: Secondary | ICD-10-CM | POA: Diagnosis not present

## 2023-09-06 DIAGNOSIS — Z951 Presence of aortocoronary bypass graft: Secondary | ICD-10-CM

## 2023-09-08 ENCOUNTER — Encounter (HOSPITAL_COMMUNITY)
Admission: RE | Admit: 2023-09-08 | Discharge: 2023-09-08 | Disposition: A | Discharge status: Home / Self Care | Source: Ambulatory Visit | Attending: Cardiology

## 2023-09-08 DIAGNOSIS — I219 Acute myocardial infarction, unspecified: Secondary | ICD-10-CM

## 2023-09-08 DIAGNOSIS — Z951 Presence of aortocoronary bypass graft: Secondary | ICD-10-CM

## 2023-09-10 ENCOUNTER — Encounter (HOSPITAL_COMMUNITY)

## 2023-09-13 ENCOUNTER — Encounter (HOSPITAL_COMMUNITY): Admission: RE | Admit: 2023-09-13 | Source: Ambulatory Visit

## 2023-09-13 ENCOUNTER — Telehealth (HOSPITAL_COMMUNITY): Payer: Self-pay

## 2023-09-15 ENCOUNTER — Encounter (HOSPITAL_COMMUNITY)
Admission: RE | Admit: 2023-09-15 | Discharge: 2023-09-15 | Disposition: A | Discharge status: Home / Self Care | Source: Ambulatory Visit | Attending: Cardiology | Admitting: Cardiology

## 2023-09-15 DIAGNOSIS — Z951 Presence of aortocoronary bypass graft: Secondary | ICD-10-CM | POA: Diagnosis not present

## 2023-09-15 DIAGNOSIS — I25810 Atherosclerosis of coronary artery bypass graft(s) without angina pectoris: Secondary | ICD-10-CM | POA: Diagnosis not present

## 2023-09-15 DIAGNOSIS — I219 Acute myocardial infarction, unspecified: Secondary | ICD-10-CM

## 2023-09-15 DIAGNOSIS — Z48812 Encounter for surgical aftercare following surgery on the circulatory system: Secondary | ICD-10-CM | POA: Diagnosis present

## 2023-09-17 ENCOUNTER — Encounter (HOSPITAL_COMMUNITY)

## 2023-09-20 ENCOUNTER — Encounter (HOSPITAL_COMMUNITY)
Admission: RE | Admit: 2023-09-20 | Discharge: 2023-09-20 | Disposition: A | Discharge status: Home / Self Care | Source: Ambulatory Visit | Attending: Cardiology | Admitting: Cardiology

## 2023-09-20 DIAGNOSIS — Z48812 Encounter for surgical aftercare following surgery on the circulatory system: Secondary | ICD-10-CM | POA: Diagnosis not present

## 2023-09-20 DIAGNOSIS — Z951 Presence of aortocoronary bypass graft: Secondary | ICD-10-CM

## 2023-09-20 DIAGNOSIS — I219 Acute myocardial infarction, unspecified: Secondary | ICD-10-CM

## 2023-09-22 ENCOUNTER — Encounter (HOSPITAL_COMMUNITY)
Admission: RE | Admit: 2023-09-22 | Discharge: 2023-09-22 | Disposition: A | Discharge status: Home / Self Care | Source: Ambulatory Visit | Attending: Cardiology | Admitting: Cardiology

## 2023-09-22 DIAGNOSIS — Z48812 Encounter for surgical aftercare following surgery on the circulatory system: Secondary | ICD-10-CM | POA: Diagnosis not present

## 2023-09-22 DIAGNOSIS — I219 Acute myocardial infarction, unspecified: Secondary | ICD-10-CM

## 2023-09-22 DIAGNOSIS — Z951 Presence of aortocoronary bypass graft: Secondary | ICD-10-CM

## 2023-09-24 ENCOUNTER — Encounter (HOSPITAL_COMMUNITY)

## 2023-09-27 ENCOUNTER — Encounter (HOSPITAL_COMMUNITY): Admission: RE | Admit: 2023-09-27 | Source: Ambulatory Visit

## 2023-09-28 ENCOUNTER — Telehealth (HOSPITAL_COMMUNITY): Payer: Self-pay

## 2023-09-28 NOTE — Telephone Encounter (Signed)
 Attempted to call patient regarding no call, no show for 11:45 CR class on 8/18 as we had not heard from him. No answer, left message letting him know we are checking to make sure he's okay.

## 2023-09-29 ENCOUNTER — Encounter (HOSPITAL_COMMUNITY)
Admission: RE | Admit: 2023-09-29 | Discharge: 2023-09-29 | Discharge status: Home / Self Care | Source: Ambulatory Visit | Attending: Cardiology

## 2023-09-29 DIAGNOSIS — Z48812 Encounter for surgical aftercare following surgery on the circulatory system: Secondary | ICD-10-CM | POA: Diagnosis not present

## 2023-09-29 DIAGNOSIS — I219 Acute myocardial infarction, unspecified: Secondary | ICD-10-CM

## 2023-09-29 DIAGNOSIS — Z951 Presence of aortocoronary bypass graft: Secondary | ICD-10-CM

## 2023-10-01 ENCOUNTER — Encounter (HOSPITAL_COMMUNITY)

## 2023-10-04 ENCOUNTER — Encounter (HOSPITAL_COMMUNITY): Admission: RE | Admit: 2023-10-04 | Source: Ambulatory Visit

## 2023-10-05 ENCOUNTER — Ambulatory Visit: Admitting: Podiatry

## 2023-10-06 ENCOUNTER — Encounter (HOSPITAL_COMMUNITY): Admission: RE | Admit: 2023-10-06 | Source: Ambulatory Visit

## 2023-10-08 ENCOUNTER — Encounter (HOSPITAL_COMMUNITY)

## 2023-10-13 ENCOUNTER — Encounter (HOSPITAL_COMMUNITY)
Admission: RE | Admit: 2023-10-13 | Discharge: 2023-10-13 | Disposition: A | Discharge status: Home / Self Care | Source: Ambulatory Visit | Attending: Cardiology | Admitting: Cardiology

## 2023-10-13 DIAGNOSIS — Z951 Presence of aortocoronary bypass graft: Secondary | ICD-10-CM | POA: Insufficient documentation

## 2023-10-13 DIAGNOSIS — I219 Acute myocardial infarction, unspecified: Secondary | ICD-10-CM | POA: Insufficient documentation

## 2023-10-13 DIAGNOSIS — Z48812 Encounter for surgical aftercare following surgery on the circulatory system: Secondary | ICD-10-CM | POA: Insufficient documentation

## 2023-10-15 ENCOUNTER — Encounter (HOSPITAL_COMMUNITY)

## 2023-10-18 ENCOUNTER — Encounter (HOSPITAL_COMMUNITY)
Admission: RE | Admit: 2023-10-18 | Discharge: 2023-10-18 | Disposition: A | Discharge status: Home / Self Care | Source: Ambulatory Visit | Attending: Cardiology | Admitting: Cardiology

## 2023-10-18 DIAGNOSIS — Z951 Presence of aortocoronary bypass graft: Secondary | ICD-10-CM

## 2023-10-18 DIAGNOSIS — I219 Acute myocardial infarction, unspecified: Secondary | ICD-10-CM | POA: Diagnosis present

## 2023-10-18 DIAGNOSIS — Z48812 Encounter for surgical aftercare following surgery on the circulatory system: Secondary | ICD-10-CM | POA: Diagnosis not present

## 2023-10-19 NOTE — Progress Notes (Signed)
 Cardiac Individual Treatment Plan  Patient Details  Name: William Matthews MRN: [id number] Date of Birth: 1950-04-09 Referring Provider:   Flowsheet Row INTENSIVE CARDIAC REHAB ORIENT from 08/26/2023 in [facility]  Referring Provider Wilbert Bihari, MD    Initial Encounter Date:  Flowsheet Row INTENSIVE CARDIAC REHAB ORIENT from 08/26/2023 in [facility]  Date 08/26/23    Visit Diagnosis: Myocardial infarction, unspecified MI type, unspecified artery (HCC)  S/P CABG (coronary artery bypass graft)  Patient's Home Medications on Admission:  Current Outpatient Medications:    amiodarone  (PACERONE ) 200 MG tablet, Take 1 tablet (200 mg total) by mouth daily. Pt. Takes 1 tablet daily., Disp: 90 tablet, Rfl: 3   amLODipine  (NORVASC ) 5 MG tablet, Take 1 tablet (5 mg total) by mouth daily., Disp: 90 tablet, Rfl: 3   apixaban  (ELIQUIS ) 5 MG TABS tablet, Take 1 tablet (5 mg total) by mouth daily., Disp: 60 tablet, Rfl: 11   atorvastatin  (LIPITOR ) 40 MG tablet, Take 1 tablet (40 mg total) by mouth at bedtime., Disp: 90 tablet, Rfl: 3   carvedilol  (COREG ) 12.5 MG tablet, Take 1 tablet (12.5 mg total) by mouth 2 (two) times daily with a meal., Disp: 180 tablet, Rfl: 3   cholecalciferol (VITAMIN D3) 25 MCG (1000 UNIT) tablet, Take 1,000 Units by mouth daily., Disp: , Rfl:    dapagliflozin  propanediol (FARXIGA ) 10 MG TABS tablet, Take 10 mg by mouth daily., Disp: , Rfl:    DULoxetine  (CYMBALTA ) 20 MG capsule, Take 20 mg by mouth daily., Disp: , Rfl:    esomeprazole (NEXIUM) 40 MG capsule, Take 40 mg by mouth daily., Disp: , Rfl:    furosemide  (LASIX ) 40 MG tablet, Take 1 tablet (40 mg total) by mouth daily., Disp: 90 tablet, Rfl: 3   lidocaine  (LIDODERM ) 5 %, Place 1 patch onto the skin daily. Remove & Discard patch within 12 hours or as directed by MD, Disp: 30 patch, Rfl: 0   meclizine  (ANTIVERT) 25 MG tablet, Take 25 mg by mouth 3 (three) times daily as needed for dizziness., Disp: , Rfl:    mirabegron  ER (MYRBETRIQ ) 50 MG TB24 tablet, Take 50 mg by mouth at bedtime., Disp: , Rfl:    sacubitril -valsartan  (ENTRESTO ) 24-26 MG, Take 1 tablet by mouth 2 (two) times daily., Disp: , Rfl:    Semaglutide-Weight Management 2.4 MG/0.75ML SOAJ, Inject 2.4 mg into the skin once a week., Disp: , Rfl:    spironolactone (ALDACTONE) 25 MG tablet, Take 12.5 mg by mouth daily., Disp: , Rfl:    tamsulosin  (FLOMAX ) 0.4 MG CAPS capsule, Take 0.8 mg by mouth daily after supper., Disp: , Rfl:    torsemide (DEMADEX) 20 MG tablet, Take 20 mg by mouth daily., Disp: , Rfl:   Past Medical History: Past Medical History:  Diagnosis Date   Anxiety    CAD (coronary artery disease)    a. s/p CABG 1994 with LIMA to D2 and LAD, SVG to D1 and ramus intermedius. b. acute inferior MI 2001 s/p rescue PTCA/stent to [facility]  b. NSTEMI 02/2014: s/p LHC with DES to oLCx c. 09/2015 PCI with DES to left main/ostial LCx   Chronic bronchitis (HCC)    they say I get it q yr (10/03/2015)   CKD (chronic kidney disease), stage II    Depression    ED (erectile dysfunction)    GERD (gastroesophageal reflux disease)  Hyperlipidemia    Hypertension    Ischemic cardiomyopathy    a. EF 48% by nuc in 2012.   Myocardial infarction [facility]) several   Nephrolithiasis    Obesity    Prediabetes     Tobacco Use: Social History   Tobacco Use  Smoking Status Never  Smokeless Tobacco Never    Labs: Review Flowsheet  More data exists      Latest Ref Rng & Units 10/09/2010 02/18/2015 02/19/2015 10/04/2015 03/27/2022  Labs for ITP Cardiac and Pulmonary Rehab  Cholestrol 0 - 200 mg/dL 863  - 897  867  891   LDL (calc) 0 - 99 mg/dL 77  - 29  82  48   HDL-C >40 mg/dL 61.19  - 35  34  30   Trlycerides <150 mg/dL 896.9  - 807  78  851   Hemoglobin A1c 4.8 - 5.6 % - 5.6  - - 5.7      Exercise Target Goals: Exercise Program  Goal: Individual exercise prescription set using results from initial 6 min walk test and THRR while considering  patient's activity barriers and safety.   Exercise Prescription Goal: Initial exercise prescription builds to 30-45 minutes a day of aerobic activity, 2-3 days per week.  Home exercise guidelines will be given to patient during program as part of exercise prescription that the participant will acknowledge.   Education: Aerobic Exercise: - Group verbal and visual presentation on the components of exercise prescription. Introduces F.I.T.T principle from ACSM for exercise prescriptions.  Reviews F.I.T.T. principles of aerobic exercise including progression. Written material provided at class time.   Education: Resistance Exercise: - Group verbal and visual presentation on the components of exercise prescription. Introduces F.I.T.T principle from ACSM for exercise prescriptions  Reviews F.I.T.T. principles of resistance exercise including progression. Written material provided at class time.    Education: Exercise & Equipment Safety: - Individual verbal instruction and demonstration of equipment use and safety with use of the equipment.   Education: Exercise Physiology & General Exercise Guidelines: - Group verbal and written instruction with models to review the exercise physiology of the cardiovascular system and associated critical values. Provides general exercise guidelines with specific guidelines to those with heart or lung disease. Written material provided at class time.   Education: Flexibility, Balance, Mind/Body Relaxation: - Group verbal and visual presentation with interactive activity on the components of exercise prescription. Introduces F.I.T.T principle from ACSM for exercise prescriptions. Reviews F.I.T.T. principles of flexibility and balance exercise training including progression. Also discusses the mind body connection.  Reviews various relaxation techniques to  help reduce and manage stress (i.e. Deep breathing, progressive muscle relaxation, and visualization). Balance handout provided to take home. Written material provided at class time.   Activity Barriers & Risk Stratification:  Activity Barriers & Cardiac Risk Stratification - 08/26/23 0917       Activity Barriers & Cardiac Risk Stratification   Activity Barriers Joint Problems;Assistive Device;Deconditioning;Shortness of Breath;Decreased Ventricular Function;History of Falls;Balance Concerns    Cardiac Risk Stratification High   <5 METs on         6 Minute Walk:  6 Minute Walk     Row Name 08/26/23 1139         6 Minute Walk   Phase Initial     Distance 1620 feet     Walk Time 6 minutes     # of Rest Breaks 0     MPH 3.07     METS 3.07  RPE 11     Perceived Dyspnea  1     VO2 Peak 10.75     Symptoms Yes (comment)     Comments R hip pain 5/10- chronic, SOB. both resolved with rest.     Resting HR 74 bpm     Resting BP 102/62     Resting Oxygen Saturation  96 %     Exercise Oxygen Saturation  during 6 min walk 96 %     Max Ex. HR 109 bpm     Max Ex. BP 150/64     2 Minute Post BP 128/66        Oxygen Initial Assessment:   Oxygen Re-Evaluation:   Oxygen Discharge (Final Oxygen Re-Evaluation):   Initial Exercise Prescription:  Initial Exercise Prescription - 08/26/23 0900       Date of Initial Exercise RX and Referring Provider   Date 08/26/23    Referring Provider Wilbert Bihari, MD    Expected Discharge Date 11/17/23      Recumbant Bike   Level 1    RPM 50    Watts 30    Minutes 15    METs 2.3      NuStep   Level 2    SPM 70    Minutes 15    METs 2.5      Prescription Details   Frequency (times per week) 3    Duration Progress to 30 minutes of continuous aerobic without signs/symptoms of physical distress      Intensity   Ratings of Perceived Exertion 11-13    Perceived Dyspnea 0-4      Progression   Progression Continue  progressive overload as per policy without signs/symptoms or physical distress.      Resistance Training   Training Prescription Yes    Weight 2    Reps 10-15          Perform Capillary Blood Glucose checks as needed.  Exercise Prescription Changes:   Exercise Prescription Changes     Row Name 08/30/23 1600 09/15/23 1500 09/29/23 1529         Response to Exercise   Blood Pressure (Admit) 96/60 100/50 112/64     Blood Pressure (Exercise) 118/70 108/62 112/60     Blood Pressure (Exit) 92/62 100/65 110/60     Heart Rate (Admit) 70 bpm 77 bpm 69 bpm     Heart Rate (Exercise) 93 bpm 93 bpm 102 bpm     Heart Rate (Exit) 76 bpm 70 bpm 64 bpm     Rating of Perceived Exertion (Exercise) 11 10 11      Symptoms None None None     Comments Pt's first day in the CRP2 program Reviewed METs Reviewed METs and goals     Duration Continue with 30 min of aerobic exercise without signs/symptoms of physical distress. Continue with 30 min of aerobic exercise without signs/symptoms of physical distress. Continue with 30 min of aerobic exercise without signs/symptoms of physical distress.     Intensity THRR unchanged THRR unchanged THRR unchanged       Progression   Progression Continue to follow PAD protocol Continue to progress workloads to maintain intensity without signs/symptoms of physical distress. Continue to progress workloads to maintain intensity without signs/symptoms of physical distress.     Average METs 1.75 1.9 2.2       Resistance Training   Training Prescription Yes No No     Weight 2 lbs No weights on wednesdays No weights on  wednesdays     Reps 10-15 -- --     Time 5 Minutes -- --       Interval Training   Interval Training No No No       Recumbant Bike   Level 1 1 2      RPM 34 47 47     Watts 60 10 25     Minutes 15 15 15      METs 1.7 1.8 2.4       NuStep   Level 2 2 2      SPM 70 78 73     Minutes 15 15 15      METs 1.8 2 2         Exercise Comments:    Exercise Comments     Row Name 08/30/23 1635 09/15/23 1500 09/29/23 1400 10/13/23 1500 10/18/23 1526   Exercise Comments Pt's firs day in the CRP2 program. Pt exercised without complaints. Pt did not eat. Exercised was stopped long enough for him to eat something. Pt instructed to eat and hydrate before coming to exercise. Reviewed METs with patient today. Pt is making slow progress. Pt has only attended 4 session since starting the program. Reviewed METs and goals with the patient. Pt is making progress on his METS. Pt's attendance has not been consistent, on 7 sessions of data for review. Pt due for METs review, did not attend. Will complete upon his return. Reviewed METs. METs are inconsistent due to patient's inconsistent attendance.      Exercise Goals and Review:   Exercise Goals     Row Name 08/26/23 0903             Exercise Goals   Increase Physical Activity Yes       Intervention Provide advice, education, support and counseling about physical activity/exercise needs.;Develop an individualized exercise prescription for aerobic and resistive training based on initial evaluation findings, risk stratification, comorbidities and participant's personal goals.       Expected Outcomes Short Term: Attend rehab on a regular basis to increase amount of physical activity.;Long Term: Exercising regularly at least 3-5 days a week.;Long Term: Add in home exercise to make exercise part of routine and to increase amount of physical activity.       Increase Strength and Stamina Yes       Intervention Provide advice, education, support and counseling about physical activity/exercise needs.;Develop an individualized exercise prescription for aerobic and resistive training based on initial evaluation findings, risk stratification, comorbidities and participant's personal goals.       Expected Outcomes Short Term: Increase workloads from initial exercise prescription for resistance, speed, and  METs.;Short Term: Perform resistance training exercises routinely during rehab and add in resistance training at home;Long Term: Improve cardiorespiratory fitness, muscular endurance and strength as measured by increased METs and functional capacity ( )       Able to understand and use rate of perceived exertion (RPE) scale Yes       Intervention Provide education and explanation on how to use RPE scale       Expected Outcomes Short Term: Able to use RPE daily in rehab to express subjective intensity level;Long Term:  Able to use RPE to guide intensity level when exercising independently       Knowledge and understanding of Target Heart Rate Range (THRR) Yes       Intervention Provide education and explanation of THRR including how the numbers were predicted and where they are located for reference  Expected Outcomes Short Term: Able to state/look up THRR;Long Term: Able to use THRR to govern intensity when exercising independently;Short Term: Able to use daily as guideline for intensity in rehab       Understanding of Exercise Prescription Yes       Intervention Provide education, explanation, and written materials on patient's individual exercise prescription       Expected Outcomes Long Term: Able to explain home exercise prescription to exercise independently;Short Term: Able to explain program exercise prescription          Exercise Goals Re-Evaluation :  Exercise Goals Re-Evaluation     Row Name 08/30/23 1634 09/29/23 1400           Exercise Goal Re-Evaluation   Exercise Goals Review Increase Physical Activity;Understanding of Exercise Prescription;Increase Strength and Stamina;Knowledge and understanding of Target Heart Rate Range (THRR);Able to understand and use rate of perceived exertion (RPE) scale Increase Physical Activity;Understanding of Exercise Prescription;Increase Strength and Stamina;Knowledge and understanding of Target Heart Rate Range (THRR);Able to understand and  use rate of perceived exertion (RPE) scale      Comments Pt's first day in the CRP2 program. Pt understands teh exercise Rx, RPE scale, and THRR. Reviewed METs and goals with patient today. Pt voices progress on his goals of improved strength, stamina, and less SOB.      Expected Outcomes Will continue to monitor patient and progress exercise workloads as tolerated. Will continue to monitor patient and progress exercise workloads as tolerated.         Discharge Exercise Prescription (Final Exercise Prescription Changes):  Exercise Prescription Changes - 09/29/23 1529       Response to Exercise   Blood Pressure (Admit) 112/64    Blood Pressure (Exercise) 112/60    Blood Pressure (Exit) 110/60    Heart Rate (Admit) 69 bpm    Heart Rate (Exercise) 102 bpm    Heart Rate (Exit) 64 bpm    Rating of Perceived Exertion (Exercise) 11    Symptoms None    Comments Reviewed METs and goals    Duration Continue with 30 min of aerobic exercise without signs/symptoms of physical distress.    Intensity THRR unchanged      Progression   Progression Continue to progress workloads to maintain intensity without signs/symptoms of physical distress.    Average METs 2.2      Resistance Training   Training Prescription No    Weight No weights on wednesdays      Interval Training   Interval Training No      Recumbant Bike   Level 2    RPM 47    Watts 25    Minutes 15    METs 2.4      NuStep   Level 2    SPM 73    Minutes 15    METs 2          Nutrition:  Target Goals: Understanding of nutrition guidelines, daily intake of sodium 1500mg , cholesterol 200mg , calories 30% from fat and 7% or less from saturated fats, daily to have 5 or more servings of fruits and vegetables.  Education: Nutrition 1 -Group instruction provided by verbal, written material, interactive activities, discussions, models, and posters to present general guidelines for heart healthy nutrition including  macronutrients, label reading, and promoting whole foods over processed counterparts. Education serves as Pensions consultant of discussion of heart healthy eating for all. Written material provided at class time.    Education: Nutrition 2 -Group instruction  provided by verbal, written material, interactive activities, discussions, models, and posters to present general guidelines for heart healthy nutrition including sodium, cholesterol, and saturated fat. Providing guidance of habit forming to improve blood pressure, cholesterol, and body weight. Written material provided at class time.     Biometrics:  Pre Biometrics - 08/26/23 0905       Pre Biometrics   Waist Circumference 51 inches    Hip Circumference 45 inches    Waist to Hip Ratio 1.13 %    Triceps Skinfold 18 mm    % Body Fat 36 %    Grip Strength 21 kg    Flexibility 9.5 in    Single Leg Stand 2 seconds           Nutrition Therapy Plan and Nutrition Goals:  Nutrition Therapy & Goals - 10/04/23 1435       Nutrition Therapy   Diet Heart Healthy Diet    Drug/Food Interactions Statins/Certain Fruits      Personal Nutrition Goals   Nutrition Goal Patient to identify strategies for reducing cardiovascular risk by attending the [employer] education and nutrition series weekly.   goal in action.   Personal Goal #2 Patient to improve diet quality by using the plate method as a guide for meal planning to include lean protein/plant protein, fruits, vegetables, whole grains, nonfat dairy as part of a well-balanced diet.   goal in action.   Personal Goal #3 Patient to identify strategies for weight loss of 0.5-2.0# per week.   goal in action.   Comments Goals in action. Patient has medical history of CAD, CVA, Afib, HTN, NSTEMI, acute CHF. LDL is well controlled. He continues semaglutide to aid with weight loss; he is down 9.2# since starting with our program. He continues to attend the pritikin education and nutrition series regularly.  Patient will benefit from participation in intensive cardiac rehab for nutrition education, exercise, and lifestyle modification.      Intervention Plan   Intervention Nutrition handout(s) given to patient.;Prescribe, educate and counsel regarding individualized specific dietary modifications aiming towards targeted core components such as weight, hypertension, lipid management, diabetes, heart failure and other comorbidities.    Expected Outcomes Short Term Goal: Understand basic principles of dietary content, such as calories, fat, sodium, cholesterol and nutrients.;Long Term Goal: Adherence to prescribed nutrition plan.          Nutrition Assessments:  MEDIFICTS Score Key: >=70 Need to make dietary changes  40-70 Heart Healthy Diet <= 40 Therapeutic Level Cholesterol Diet   Picture Your Plate Scores: <59 Unhealthy dietary pattern with much room for improvement. 41-50 Dietary pattern unlikely to meet recommendations for good health and room for improvement. 51-60 More healthful dietary pattern, with some room for improvement.  >60 Healthy dietary pattern, although there may be some specific behaviors that could be improved.    Nutrition Goals Re-Evaluation:  Nutrition Goals Re-Evaluation     Row Name 08/30/23 1319 10/04/23 1435           Goals   Current Weight 249 lb 9 oz (113.2 kg) 240 lb 4.8 oz (109 kg)      Comment A1c WNL, GFR 55, Cr 1.35, HDL 27, LDL 45 A1c WNL, GFR 55, Cr 1.35, HDL 27, LDL 45      Expected Outcome Patient has medical history of CAD, CVA, Afib, HTN, NSTEMI, acute CHF. LDL is well controlled. He continues semaglutide to aid with weight loss. Patient will benefit from participation in intensive cardiac rehab  for nutrition education, exercise, and lifestyle modification. Goals in action. Patient has medical history of CAD, CVA, Afib, HTN, NSTEMI, acute CHF. LDL is well controlled. He continues semaglutide to aid with weight loss; he is down 9.2# since starting  with our program. He continues to attend the pritikin education and nutrition series regularly. Patient will benefit from participation in intensive cardiac rehab for nutrition education, exercise, and lifestyle modification.         Nutrition Goals Discharge (Final Nutrition Goals Re-Evaluation):  Nutrition Goals Re-Evaluation - 10/04/23 1435       Goals   Current Weight 240 lb 4.8 oz (109 kg)    Comment A1c WNL, GFR 55, Cr 1.35, HDL 27, LDL 45    Expected Outcome Goals in action. Patient has medical history of CAD, CVA, Afib, HTN, NSTEMI, acute CHF. LDL is well controlled. He continues semaglutide to aid with weight loss; he is down 9.2# since starting with our program. He continues to attend the pritikin education and nutrition series regularly. Patient will benefit from participation in intensive cardiac rehab for nutrition education, exercise, and lifestyle modification.          Psychosocial: Target Goals: Acknowledge presence or absence of significant depression and/or stress, maximize coping skills, provide positive support system. Participant is able to verbalize types and ability to use techniques and skills needed for reducing stress and depression.   Education: Stress, Anxiety, and Depression - Group verbal and visual presentation to define topics covered.  Reviews how body is impacted by stress, anxiety, and depression.  Also discusses healthy ways to reduce stress and to treat/manage anxiety and depression. Written material provided at class time.   Education: Sleep Hygiene -Provides group verbal and written instruction about how sleep can affect your health.  Define sleep hygiene, discuss sleep cycles and impact of sleep habits. Review good sleep hygiene tips.   Initial Review & Psychosocial Screening:  Initial Psych Review & Screening - 08/26/23 0920       Initial Review   Current issues with Current Psychotropic Meds      Family Dynamics   Good Support System? Yes    daughter   Comments William Matthews currently denies any feelings of depression/anxiety/stress. He does take cymbalta , however he is unsure why because he stated he does not have feelings of depression.      Barriers   Psychosocial barriers to participate in program There are no identifiable barriers or psychosocial needs.      Screening Interventions   Interventions Encouraged to exercise;Provide feedback about the scores to participant    Expected Outcomes Long Term goal: The participant improves quality of Life and PHQ9 Scores as seen by post scores and/or verbalization of changes;Short Term goal: Identification and review with participant of any Quality of Life or Depression concerns found by scoring the questionnaire.          Quality of Life Scores:   Quality of Life - 08/26/23 1142       Quality of Life   Select Quality of Life      Quality of Life Scores   Health/Function Pre 21.9 %    Socioeconomic Pre 22.31 %    Psych/Spiritual Pre 23.57 %    Family Pre 20.2 %    GLOBAL Pre 22.09 %         Scores of 19 and below usually indicate a poorer quality of life in these areas.  A difference of  2-3 points is a clinically meaningful  difference.  A difference of 2-3 points in the total score of the Quality of Life Index has been associated with significant improvement in overall quality of life, self-image, physical symptoms, and general health in studies assessing change in quality of life.  PHQ-9: Review Flowsheet       08/26/2023 02/26/2015  Depression screen PHQ 2/9  Decreased Interest 0 0  Down, Depressed, Hopeless 0 0  PHQ - 2 Score 0 0  Altered sleeping 0 -  Tired, decreased energy 0 -  Change in appetite 0 -  Feeling bad or failure about yourself  0 -  Trouble concentrating 0 -  Moving slowly or fidgety/restless 0 -  Suicidal thoughts 0 -  PHQ-9 Score 0 -   Interpretation of Total Score  Total Score Depression Severity:  1-4 = Minimal depression, 5-9 = Mild  depression, 10-14 = Moderate depression, 15-19 = Moderately severe depression, 20-27 = Severe depression   Psychosocial Evaluation and Intervention:   Psychosocial Re-Evaluation:  Psychosocial Re-Evaluation     Row Name 08/30/23 1444 10/19/23 0849           Psychosocial Re-Evaluation   Current issues with Current Psychotropic Meds Current Psychotropic Meds  Patient is taking cymbalta       Comments Pt started cardiac rehab today (08/30/23). He did not voice any concerns or stressors. William Matthews has not voiced any additional psychosocial concerns or stressors during exercise at cardiac rehab.      Expected Outcomes Pt will have controlled/reduced stress and anxiety by completion of cardiac rehab. Pt will have controlled/reduced stress and anxiety by completion of cardiac rehab.      Interventions Stress management education;Relaxation education;Encouraged to attend Cardiac Rehabilitation for the exercise Stress management education;Relaxation education;Encouraged to attend Cardiac Rehabilitation for the exercise      Continue Psychosocial Services  -- Follow up required by staff         Psychosocial Discharge (Final Psychosocial Re-Evaluation):  Psychosocial Re-Evaluation - 10/19/23 0849       Psychosocial Re-Evaluation   Current issues with Current Psychotropic Meds   Patient is taking cymbalta    Comments William Matthews has not voiced any additional psychosocial concerns or stressors during exercise at cardiac rehab.    Expected Outcomes Pt will have controlled/reduced stress and anxiety by completion of cardiac rehab.    Interventions Stress management education;Relaxation education;Encouraged to attend Cardiac Rehabilitation for the exercise    Continue Psychosocial Services  Follow up required by staff          Vocational Rehabilitation: Provide vocational rehab assistance to qualifying candidates.   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 Participant will state understanding/return demonstration of topics presented as noted by education test scores.  Learning Barriers/Preferences:  Learning Barriers/Preferences - 08/26/23 9076       Learning Barriers/Preferences   Learning Barriers Sight   glasses   Learning Preferences Group Instruction;Individual Instruction;Skilled Demonstration;Verbal Instruction;Video          General Cardiac Education Topics:  AED/CPR: - Group verbal and written instruction with the use of models to demonstrate the basic use of the AED with the basic ABC's of  resuscitation.   Test and Procedures: - Group verbal and visual presentation and models provide information about basic cardiac anatomy and function. Reviews the testing methods done to diagnose heart disease and the outcomes of the test results. Describes the treatment choices: Medical Management, Angioplasty, or Coronary Bypass Surgery for treating various heart conditions including Myocardial Infarction, Angina, Valve Disease, and Cardiac Arrhythmias. Written material provided at class time.   Medication Safety: - Group verbal and visual instruction to review commonly prescribed medications for heart and lung disease. Reviews the medication, class of the drug, and side effects. Includes the steps to properly store meds and maintain the prescription regimen. Written material provided at class time.   Intimacy: - Group verbal instruction through game format to discuss how heart and lung disease can affect sexual intimacy. Written material provided at class time.   Know Your Numbers and Heart Failure: - Group verbal and  visual instruction to discuss disease risk factors for cardiac and pulmonary disease and treatment options.  Reviews associated critical values for Overweight/Obesity, Hypertension, Cholesterol, and Diabetes.  Discusses basics of heart failure: signs/symptoms and treatments.  Introduces Heart Failure Zone chart for action plan for heart failure. Written material provided at class time.   Infection Prevention: - Provides verbal and written material to individual with discussion of infection control including proper hand washing and proper equipment cleaning during exercise session.   Falls Prevention: - Provides verbal and written material to individual with discussion of falls prevention and safety.   Other: -Provides group and verbal instruction on various topics (see comments)   Knowledge Questionnaire Score:  Knowledge Questionnaire Score - 08/26/23 0924       Knowledge Questionnaire Score   Pre Score 23/24          Core Components/Risk Factors/Patient Goals at Admission:  Personal Goals and Risk Factors at Admission - 08/26/23 0924       Core Components/Risk Factors/Patient Goals on Admission    Weight Management Yes;Obesity    Intervention Weight Management: Develop a combined nutrition and exercise program designed to reach desired caloric intake, while maintaining appropriate intake of nutrient and fiber, sodium and fats, and appropriate energy expenditure required for the weight goal.;Weight Management: Provide education and appropriate resources to help participant work on and attain dietary goals.;Obesity: Provide education and appropriate resources to help participant work on and attain dietary goals.;Weight Management/Obesity: Establish reasonable short term and long term weight goals.    Admit Weight 249 lb 9 oz (113.2 kg)    Goal Weight: Long Term 200 lb (90.7 kg)   pt goal   Expected Outcomes Short Term: Continue to assess and modify interventions until short term  weight is achieved;Long Term: Adherence to nutrition and physical activity/exercise program aimed toward attainment of established weight goal;Weight Loss: Understanding of general recommendations for a balanced deficit meal plan, which promotes 1-2 lb weight loss per week and includes a negative energy balance of [phone] kcal/d;Understanding of distribution of calorie intake throughout the day with the consumption of 4-5 meals/snacks;Understanding recommendations for meals to include 15-35% energy as protein, 25-35% energy from fat, 35-60% energy from carbohydrates, less than 200mg  of dietary cholesterol, 20-35 gm of total fiber daily    Heart Failure Yes    Intervention Provide a combined exercise and nutrition program that is supplemented with education, support and counseling about heart failure. Directed toward relieving symptoms such as shortness of breath, decreased exercise tolerance, and extremity edema.    Expected Outcomes Improve functional capacity  of life;Short term: Attendance in program 2-3 days a week with increased exercise capacity. Reported lower sodium intake. Reported increased fruit and vegetable intake. Reports medication compliance.;Short term: Daily weights obtained and reported for increase. Utilizing diuretic protocols set by physician.;Long term: Adoption of self-care skills and reduction of barriers for early signs and symptoms recognition and intervention leading to self-care maintenance.    Hypertension Yes    Intervention Monitor prescription use compliance.;Provide education on lifestyle modifcations including regular physical activity/exercise, weight management, moderate sodium restriction and increased consumption of fresh fruit, vegetables, and low fat dairy, alcohol  moderation, and smoking cessation.    Expected Outcomes Long Term: Maintenance of blood pressure at goal levels.;Short Term: Continued assessment and intervention until BP is < 140/67mm HG in hypertensive  participants. < 130/58mm HG in hypertensive participants with diabetes, heart failure or chronic kidney disease.    Lipids Yes    Intervention Provide education and support for participant on nutrition & aerobic/resistive exercise along with prescribed medications to achieve LDL 70mg , HDL >40mg .    Expected Outcomes Short Term: Participant states understanding of desired cholesterol values and is compliant with medications prescribed. Participant is following exercise prescription and nutrition guidelines.;Long Term: Cholesterol controlled with medications as prescribed, with individualized exercise RX and with personalized nutrition plan. Value goals: LDL < 70mg , HDL > 40 mg.          Education:Diabetes - Individual verbal and written instruction to review signs/symptoms of diabetes, desired ranges of glucose level fasting, after meals and with exercise. Acknowledge that pre and post exercise glucose checks will be done for 3 sessions at entry of program.   Core Components/Risk Factors/Patient Goals Review:   Goals and Risk Factor Review     Row Name 08/30/23 1446 10/19/23 0851           Core Components/Risk Factors/Patient Goals Review   Personal Goals Review Weight Management/Obesity;Heart Failure;Hypertension;Lipids Weight Management/Obesity;Heart Failure;Hypertension;Lipids      Review Pt started exercise today (08/30/23). VSS. Tolerated exercise well. Unnamed is doing well with exercise at cardiac rehab. Vital signs have been stable. William Matthews has increased his met levels on the recumbent bike.      Expected Outcomes Pt will continue to participate in cardiac rehab for exercise, nutrition, and lifestyle modifications Pt will continue to participate in cardiac rehab for exercise, nutrition, and lifestyle modifications         Core Components/Risk Factors/Patient Goals at Discharge (Final Review):   Goals and Risk Factor Review - 10/19/23 0851       Core Components/Risk Factors/Patient  Goals Review   Personal Goals Review Weight Management/Obesity;Heart Failure;Hypertension;Lipids    Review William Matthews is doing well with exercise at cardiac rehab. Vital signs have been stable. William Matthews has increased his met levels on the recumbent bike.    Expected Outcomes Pt will continue to participate in cardiac rehab for exercise, nutrition, and lifestyle modifications          ITP Comments:  ITP Comments     Row Name 08/26/23 9167 08/30/23 1441 10/19/23 0848       ITP Comments Wilbert Bihari, MD: Medical Director. Introduction to the [facility] Education program/Intensive Cardaic Rehab. Initial orientation packet reviewed with the patient 30 Day ITP Review. Pt started cardiac rehab today (08/30/23). Tolerated exercise well. 30 Day ITP Review. Pt started cardiac rehab on 08/30/23.  William Matthews has good attendance and participation with exercise at cardiac rehab        Comments: see ITP comments

## 2023-10-20 ENCOUNTER — Encounter (HOSPITAL_COMMUNITY): Admission: RE | Admit: 2023-10-20 | Source: Ambulatory Visit

## 2023-10-22 ENCOUNTER — Encounter (HOSPITAL_COMMUNITY)

## 2023-10-25 ENCOUNTER — Encounter (HOSPITAL_COMMUNITY)
Admission: RE | Admit: 2023-10-25 | Discharge: 2023-10-25 | Disposition: A | Discharge status: Home / Self Care | Source: Ambulatory Visit | Attending: Cardiology

## 2023-10-25 DIAGNOSIS — I219 Acute myocardial infarction, unspecified: Secondary | ICD-10-CM | POA: Diagnosis not present

## 2023-10-25 DIAGNOSIS — Z951 Presence of aortocoronary bypass graft: Secondary | ICD-10-CM

## 2023-10-25 NOTE — Progress Notes (Signed)
Reviewed home exercise Rx with patient today.  Encouraged warm-up, cool-down, and stretching. Reviewed THRR of  58 - 117 and keeping RPE between 11-13. Encouraged to hydrate with activity.  Reviewed weather parameters for temperature and humidity for safe exercise outdoors. Reviewed S/S to terminate exercise and when to call 911 vs MD. Pt encouraged to always carry a cell phone for safety when exercising outdoors. Pt verbalized understanding of the home exercise Rx and was provided a copy.   Lesly Rubenstein MS, ACSM-CEP, CCRP

## 2023-10-27 ENCOUNTER — Encounter (HOSPITAL_COMMUNITY)

## 2023-10-29 ENCOUNTER — Encounter (HOSPITAL_COMMUNITY)

## 2023-11-01 ENCOUNTER — Encounter (HOSPITAL_COMMUNITY): Admission: RE | Admit: 2023-11-01 | Source: Ambulatory Visit

## 2023-11-03 ENCOUNTER — Encounter (HOSPITAL_COMMUNITY)
Admission: RE | Admit: 2023-11-03 | Discharge: 2023-11-03 | Disposition: A | Discharge status: Home / Self Care | Source: Ambulatory Visit | Attending: Cardiology | Admitting: Cardiology

## 2023-11-03 DIAGNOSIS — Z951 Presence of aortocoronary bypass graft: Secondary | ICD-10-CM

## 2023-11-03 DIAGNOSIS — I219 Acute myocardial infarction, unspecified: Secondary | ICD-10-CM

## 2023-11-05 ENCOUNTER — Encounter (HOSPITAL_COMMUNITY)

## 2023-11-08 ENCOUNTER — Encounter (HOSPITAL_COMMUNITY)
Admission: RE | Admit: 2023-11-08 | Discharge: 2023-11-08 | Disposition: A | Source: Ambulatory Visit | Attending: Cardiology

## 2023-11-08 DIAGNOSIS — Z951 Presence of aortocoronary bypass graft: Secondary | ICD-10-CM

## 2023-11-08 DIAGNOSIS — I219 Acute myocardial infarction, unspecified: Secondary | ICD-10-CM | POA: Diagnosis not present

## 2023-11-10 ENCOUNTER — Encounter (HOSPITAL_COMMUNITY)
Admission: RE | Admit: 2023-11-10 | Discharge: 2023-11-10 | Disposition: A | Discharge status: Home / Self Care | Source: Ambulatory Visit | Attending: Cardiology | Admitting: Cardiology

## 2023-11-10 VITALS — Ht 71.0 in | Wt 235.9 lb

## 2023-11-10 DIAGNOSIS — I219 Acute myocardial infarction, unspecified: Secondary | ICD-10-CM | POA: Diagnosis present

## 2023-11-10 DIAGNOSIS — Z951 Presence of aortocoronary bypass graft: Secondary | ICD-10-CM | POA: Insufficient documentation

## 2023-11-10 NOTE — Addendum Note (Signed)
 Encounter addended by: Elnor Benders B on: 11/10/2023 2:30 PM  Actions taken: Flowsheet accepted

## 2023-11-12 ENCOUNTER — Encounter (HOSPITAL_COMMUNITY)

## 2023-11-15 ENCOUNTER — Encounter (HOSPITAL_COMMUNITY)
Admission: RE | Admit: 2023-11-15 | Discharge: 2023-11-15 | Disposition: A | Source: Ambulatory Visit | Attending: Cardiology

## 2023-11-15 DIAGNOSIS — I219 Acute myocardial infarction, unspecified: Secondary | ICD-10-CM

## 2023-11-15 DIAGNOSIS — Z951 Presence of aortocoronary bypass graft: Secondary | ICD-10-CM

## 2023-11-15 NOTE — Progress Notes (Signed)
 Discharge Progress Report  Patient Details  Name: William Matthews MRN: [id number] Date of Birth: 17-Nov-1950 Referring Provider:   Flowsheet Row INTENSIVE CARDIAC REHAB ORIENT from 08/26/2023 in [facility]  Referring Provider Wilbert Bihari, MD     Number of Visits: 29   Reason for Discharge:  Patient reached a stable level of exercise. Patient independent in their exercise. Patient has met program and personal goals.  Smoking History:  Social History   Tobacco Use  Smoking Status Never  Smokeless Tobacco Never    Diagnosis:  Myocardial infarction, unspecified MI type, unspecified artery (HCC)  S/P CABG (coronary artery bypass graft)  ADL [facility]:   Initial Exercise Prescription:  Initial Exercise Prescription - 08/26/23 0900       Date of Initial Exercise RX and Referring Provider   Date 08/26/23    Referring Provider Wilbert Bihari, MD    Expected Discharge Date 11/17/23      Recumbant Bike   Level 1    RPM 50    Watts 30    Minutes 15    METs 2.3      NuStep   Level 2    SPM 70    Minutes 15    METs 2.5      Prescription Details   Frequency (times per week) 3    Duration Progress to 30 minutes of continuous aerobic without signs/symptoms of physical distress      Intensity   Ratings of Perceived Exertion 11-13    Perceived Dyspnea 0-4      Progression   Progression Continue progressive overload as per policy without signs/symptoms or physical distress.      Resistance Training   Training Prescription Yes    Weight 2    Reps 10-15          Discharge Exercise Prescription (Final Exercise Prescription Changes):  Exercise Prescription Changes - 11/17/23 1500       Response to Exercise   Blood Pressure (Admit) 116/64    Blood Pressure (Exit) 102/64    Heart Rate (Admit) 75 bpm    Heart Rate (Exercise) 102 bpm    Heart Rate (Exit) 73 bpm    Rating of Perceived Exertion (Exercise) 12     Symptoms None    Comments Pt graduated from the CRP2 program today    Duration Continue with 30 min of aerobic exercise without signs/symptoms of physical distress.    Intensity THRR unchanged      Progression   Progression Continue to progress workloads to maintain intensity without signs/symptoms of physical distress.    Average METs 2.4      Resistance Training   Training Prescription No    Weight no wts on Wednesdays      Interval Training   Interval Training No      Recumbant Bike   Level 4    RPM 31    Watts 24    Minutes 15    METs 2.4      NuStep   Level 2    SPM 64    Minutes 15    METs 2.4      Home Exercise Plan   Plans to continue exercise at Home (comment)    Frequency Add 3 additional days to program exercise sessions.    Initial Home Exercises Provided 10/25/23          Functional Capacity:  6 Minute Walk  Row Name 08/26/23 1139 11/10/23 1305       6 Minute Walk   Phase Initial Discharge    Distance 1380 feet 1320 feet    Distance % Change -- -4.35 %    Distance Feet Change -- 60 ft    Walk Time 6 minutes 6 minutes    # of Rest Breaks 0 0    MPH 3.07 2.5    METS 3.07 2.3    RPE 11 12    Perceived Dyspnea  1 0    VO2 Peak 10.75 8.2    Symptoms Yes (comment) No    Comments R hip pain 5/10- chronic, SOB. both resolved with rest. --    Resting HR 74 bpm 63 bpm    Resting BP 102/62 108/60    Resting Oxygen Saturation  96 % --    Exercise Oxygen Saturation  during 6 min walk 96 % --    Max Ex. HR 109 bpm 99 bpm    Max Ex. BP 150/64 120/60    2 Minute Post BP 128/66 --       Psychological, QOL, Others - Outcomes: PHQ 2/9:    11/15/2023    1:44 PM 08/26/2023    9:17 AM 02/26/2015    3:17 PM  Depression screen PHQ 2/9  Decreased Interest 0 0 0  Down, Depressed, Hopeless 0 0 0  PHQ - 2 Score 0 0 0  Altered sleeping 0 0   Tired, decreased energy 0 0   Change in appetite 0 0   Feeling bad or failure about yourself  0 0   Trouble  concentrating 0 0   Moving slowly or fidgety/restless 0 0   Suicidal thoughts 0 0   PHQ-9 Score 0 0     Quality of Life:  Quality of Life - 11/11/23 0809       Quality of Life Scores   Health/Function Pre 21.9 %    Health/Function Post 15.1 %    Health/Function % Change -31.05 %    Socioeconomic Pre 22.31 %    Socioeconomic Post 16.69 %    Socioeconomic % Change  -25.19 %    Psych/Spiritual Pre 23.57 %    Psych/Spiritual Post 20.36 %    Psych/Spiritual % Change -13.62 %    Family Pre 20.2 %    Family Post 14.4 %    Family % Change -28.71 %    GLOBAL Pre 22.09 %    GLOBAL Post 16.4 %    GLOBAL % Change -25.76 %          Personal Goals: Goals established at orientation with interventions provided to work toward goal.  Personal Goals and Risk Factors at Admission - 08/26/23 0924       Core Components/Risk Factors/Patient Goals on Admission    Weight Management Yes;Obesity    Intervention Weight Management: Develop a combined nutrition and exercise program designed to reach desired caloric intake, while maintaining appropriate intake of nutrient and fiber, sodium and fats, and appropriate energy expenditure required for the weight goal.;Weight Management: Provide education and appropriate resources to help participant work on and attain dietary goals.;Obesity: Provide education and appropriate resources to help participant work on and attain dietary goals.;Weight Management/Obesity: Establish reasonable short term and long term weight goals.    Admit Weight 249 lb 9 oz (113.2 kg)    Goal Weight: Long Term 200 lb (90.7 kg)   pt goal   Expected Outcomes Short Term: Continue to  assess and modify interventions until short term weight is achieved;Long Term: Adherence to nutrition and physical activity/exercise program aimed toward attainment of established weight goal;Weight Loss: Understanding of general recommendations for a balanced deficit meal plan, which promotes 1-2 lb weight  loss per week and includes a negative energy balance of [phone] kcal/d;Understanding of distribution of calorie intake throughout the day with the consumption of 4-5 meals/snacks;Understanding recommendations for meals to include 15-35% energy as protein, 25-35% energy from fat, 35-60% energy from carbohydrates, less than 200mg  of dietary cholesterol, 20-35 gm of total fiber daily    Heart Failure Yes    Intervention Provide a combined exercise and nutrition program that is supplemented with education, support and counseling about heart failure. Directed toward relieving symptoms such as shortness of breath, decreased exercise tolerance, and extremity edema.    Expected Outcomes Improve functional capacity of life;Short term: Attendance in program 2-3 days a week with increased exercise capacity. Reported lower sodium intake. Reported increased fruit and vegetable intake. Reports medication compliance.;Short term: Daily weights obtained and reported for increase. Utilizing diuretic protocols set by physician.;Long term: Adoption of self-care skills and reduction of barriers for early signs and symptoms recognition and intervention leading to self-care maintenance.    Hypertension Yes    Intervention Monitor prescription use compliance.;Provide education on lifestyle modifcations including regular physical activity/exercise, weight management, moderate sodium restriction and increased consumption of fresh fruit, vegetables, and low fat dairy, alcohol  moderation, and smoking cessation.    Expected Outcomes Long Term: Maintenance of blood pressure at goal levels.;Short Term: Continued assessment and intervention until BP is < 140/31mm HG in hypertensive participants. < 130/67mm HG in hypertensive participants with diabetes, heart failure or chronic kidney disease.    Lipids Yes    Intervention Provide education and support for participant on nutrition & aerobic/resistive exercise along with prescribed  medications to achieve LDL 70mg , HDL >40mg .    Expected Outcomes Short Term: Participant states understanding of desired cholesterol values and is compliant with medications prescribed. Participant is following exercise prescription and nutrition guidelines.;Long Term: Cholesterol controlled with medications as prescribed, with individualized exercise RX and with personalized nutrition plan. Value goals: LDL < 70mg , HDL > 40 mg.           Personal Goals Discharge:  Goals and Risk Factor Review     Row Name 08/30/23 1446 10/19/23 0851 11/10/23 0840         Core Components/Risk Factors/Patient Goals Review   Personal Goals Review Weight Management/Obesity;Heart Failure;Hypertension;Lipids Weight Management/Obesity;Heart Failure;Hypertension;Lipids Weight Management/Obesity;Heart Failure;Hypertension;Lipids     Review Pt started exercise today (08/30/23). VSS. Tolerated exercise well. Omeed is doing well with exercise at cardiac rehab. Vital signs have been stable. Jonathandavid has increased his met levels on the recumbent bike. Aleph is doing well with exercise at cardiac rehab when in attendance. VSS. Rylon has increased his met levels     Expected Outcomes Pt will continue to participate in cardiac rehab for exercise, nutrition, and lifestyle modifications Pt will continue to participate in cardiac rehab for exercise, nutrition, and lifestyle modifications Pt will continue to participate in cardiac rehab for exercise, nutrition, and lifestyle modifications        Exercise Goals and Review:  Exercise Goals     Row Name 08/26/23 0903             Exercise Goals   Increase Physical Activity Yes       Intervention Provide advice, education, support and counseling about physical activity/exercise needs.;Develop  an individualized exercise prescription for aerobic and resistive training based on initial evaluation findings, risk stratification, comorbidities and participant's personal goals.        Expected Outcomes Short Term: Attend rehab on a regular basis to increase amount of physical activity.;Long Term: Exercising regularly at least 3-5 days a week.;Long Term: Add in home exercise to make exercise part of routine and to increase amount of physical activity.       Increase Strength and Stamina Yes       Intervention Provide advice, education, support and counseling about physical activity/exercise needs.;Develop an individualized exercise prescription for aerobic and resistive training based on initial evaluation findings, risk stratification, comorbidities and participant's personal goals.       Expected Outcomes Short Term: Increase workloads from initial exercise prescription for resistance, speed, and METs.;Short Term: Perform resistance training exercises routinely during rehab and add in resistance training at home;Long Term: Improve cardiorespiratory fitness, muscular endurance and strength as measured by increased METs and functional capacity ( )       Able to understand and use rate of perceived exertion (RPE) scale Yes       Intervention Provide education and explanation on how to use RPE scale       Expected Outcomes Short Term: Able to use RPE daily in rehab to express subjective intensity level;Long Term:  Able to use RPE to guide intensity level when exercising independently       Knowledge and understanding of Target Heart Rate Range (THRR) Yes       Intervention Provide education and explanation of THRR including how the numbers were predicted and where they are located for reference       Expected Outcomes Short Term: Able to state/look up THRR;Long Term: Able to use THRR to govern intensity when exercising independently;Short Term: Able to use daily as guideline for intensity in rehab       Understanding of Exercise Prescription Yes       Intervention Provide education, explanation, and written materials on patient's individual exercise prescription       Expected  Outcomes Long Term: Able to explain home exercise prescription to exercise independently;Short Term: Able to explain program exercise prescription          Exercise Goals Re-Evaluation:  Exercise Goals Re-Evaluation     Row Name 08/30/23 1634 09/29/23 1400 11/10/23 1623 11/17/23 1500       Exercise Goal Re-Evaluation   Exercise Goals Review Increase Physical Activity;Understanding of Exercise Prescription;Increase Strength and Stamina;Knowledge and understanding of Target Heart Rate Range (THRR);Able to understand and use rate of perceived exertion (RPE) scale Increase Physical Activity;Understanding of Exercise Prescription;Increase Strength and Stamina;Knowledge and understanding of Target Heart Rate Range (THRR);Able to understand and use rate of perceived exertion (RPE) scale Increase Physical Activity;Understanding of Exercise Prescription;Increase Strength and Stamina;Knowledge and understanding of Target Heart Rate Range (THRR);Able to understand and use rate of perceived exertion (RPE) scale Increase Physical Activity;Understanding of Exercise Prescription;Increase Strength and Stamina;Knowledge and understanding of Target Heart Rate Range (THRR);Able to understand and use rate of perceived exertion (RPE) scale    Comments Pt's first day in the CRP2 program. Pt understands teh exercise Rx, RPE scale, and THRR. Reviewed METs and goals with patient today. Pt voices progress on his goals of improved strength, stamina, and less SOB. Reviewed METs and goals with patient today. Pt  continues to voice progress on his goals of improved strength, stamina, and less SOB. Pt has goal of weight loss and  has lost 12 lbs since starting the program. Pt graduated from the CRP2 program today. Pt has voiced he has seen improvement in his strength and stamina and has less SOB with activities. Pt has a long term goal to lose 40 lbs. Pt lost 15 lbs during the program.    Expected Outcomes Will continue to monitor  patient and progress exercise workloads as tolerated. Will continue to monitor patient and progress exercise workloads as tolerated. Will continue to monitor patient and progress exercise workloads as tolerated. Pt plans to continue his exericse by walking at home 4x/week , 30-45 minutes.       Nutrition & Weight - Outcomes:  Pre Biometrics - 08/26/23 0905       Pre Biometrics   Waist Circumference 51 inches    Hip Circumference 45 inches    Waist to Hip Ratio 1.13 %    Triceps Skinfold 18 mm    % Body Fat 36 %    Grip Strength 21 kg    Flexibility 9.5 in    Single Leg Stand 2 seconds          Post Biometrics - 11/10/23 1255        Post  Biometrics   Height 5' 11 (1.803 m)    Weight 107 kg    Waist Circumference 48.5 inches    Hip Circumference 45 inches    Waist to Hip Ratio 1.08 %    BMI (Calculated) 32.91    Triceps Skinfold 16 mm    % Body Fat 33.6 %    Grip Strength 22 kg    Flexibility 10 in    Single Leg Stand 2.5 seconds          Nutrition:  Nutrition Therapy & Goals - 10/04/23 1435       Nutrition Therapy   Diet Heart Healthy Diet    Drug/Food Interactions Statins/Certain Fruits      Personal Nutrition Goals   Nutrition Goal Patient to identify strategies for reducing cardiovascular risk by attending the [facility] education and nutrition series weekly.   goal in action.   Personal Goal #2 Patient to improve diet quality by using the plate method as a guide for meal planning to include lean protein/plant protein, fruits, vegetables, whole grains, nonfat dairy as part of a well-balanced diet.   goal in action.   Personal Goal #3 Patient to identify strategies for weight loss of 0.5-2.0# per week.   goal in action.   Comments Goals in action. Patient has medical history of CAD, CVA, Afib, HTN, NSTEMI, acute CHF. LDL is well controlled. He continues semaglutide to aid with weight loss; he is down 9.2# since starting with our program. He continues to attend  the [facility]ikin education and nutrition series regularly. Patient will benefit from participation in intensive cardiac rehab for nutrition education, exercise, and lifestyle modification.      Intervention Plan   Intervention Nutrition handout(s) given to patient.;Prescribe, educate and counsel regarding individualized specific dietary modifications aiming towards targeted core components such as weight, hypertension, lipid management, diabetes, heart failure and other comorbidities.    Expected Outcomes Short Term Goal: Understand basic principles of dietary content, such as calories, fat, sodium, cholesterol and nutrients.;Long Term Goal: Adherence to prescribed nutrition plan.          Nutrition Discharge:   Education Questionnaire Score:  Knowledge Questionnaire Score - 08/26/23 0924       Knowledge Questionnaire Score   Pre Score 23/24  Pt graduates from  Intensive/Traditional cardiac rehab program today with completion of 29 exercise and education sessions. Pt maintained good attendance and progressed nicely during their participation in rehab as evidenced by increased MET level.   Medication list reconciled. Repeat  PHQ score-0.  Pt has made significant lifestyle changes and should be commended for his success.  Sheron achieved his goals during cardiac rehab.   Pt plans to continue exercise at home by walking.  Goals reviewed with patient; copy given to patient.

## 2023-11-16 ENCOUNTER — Encounter (HOSPITAL_COMMUNITY): Payer: Self-pay

## 2023-11-16 NOTE — Progress Notes (Signed)
 Cardiac Individual Treatment Plan  Patient Details  Name: William Matthews MRN: [id number] Date of Birth: 09-Apr-1950 Referring Provider:   Flowsheet Row INTENSIVE CARDIAC REHAB ORIENT from 08/26/2023 in [facility]  Referring Provider Wilbert Bihari, MD    Initial Encounter Date:  Flowsheet Row INTENSIVE CARDIAC REHAB ORIENT from 08/26/2023 in [facility]  Date 08/26/23    Visit Diagnosis: No diagnosis found.  Patient's Home Medications on Admission:  Current Outpatient Medications:    amiodarone  (PACERONE ) 200 MG tablet, Take 1 tablet (200 mg total) by mouth daily. Pt. Takes 1 tablet daily., Disp: 90 tablet, Rfl: 3   amLODipine  (NORVASC ) 5 MG tablet, Take 1 tablet (5 mg total) by mouth daily., Disp: 90 tablet, Rfl: 3   apixaban  (ELIQUIS ) 5 MG TABS tablet, Take 1 tablet (5 mg total) by mouth daily., Disp: 60 tablet, Rfl: 11   atorvastatin  (LIPITOR ) 40 MG tablet, Take 1 tablet (40 mg total) by mouth at bedtime., Disp: 90 tablet, Rfl: 3   carvedilol  (COREG ) 12.5 MG tablet, Take 1 tablet (12.5 mg total) by mouth 2 (two) times daily with a meal., Disp: 180 tablet, Rfl: 3   cholecalciferol (VITAMIN D3) 25 MCG (1000 UNIT) tablet, Take 1,000 Units by mouth daily., Disp: , Rfl:    dapagliflozin  propanediol (FARXIGA ) 10 MG TABS tablet, Take 10 mg by mouth daily., Disp: , Rfl:    DULoxetine  (CYMBALTA ) 20 MG capsule, Take 20 mg by mouth daily., Disp: , Rfl:    esomeprazole (NEXIUM) 40 MG capsule, Take 40 mg by mouth daily., Disp: , Rfl:    furosemide  (LASIX ) 40 MG tablet, Take 1 tablet (40 mg total) by mouth daily. (Patient not taking: Reported on 11/15/2023), Disp: 90 tablet, Rfl: 3   lidocaine  (LIDODERM ) 5 %, Place 1 patch onto the skin daily. Remove & Discard patch within 12 hours or as directed by MD (Patient not taking: Reported on 11/15/2023), Disp: 30 patch, Rfl: 0   meclizine  (ANTIVERT) 25 MG tablet, Take 25 mg by mouth 3 (three) times daily as needed for dizziness., Disp: , Rfl:    mirabegron  ER (MYRBETRIQ ) 50 MG TB24 tablet, Take 50 mg by mouth at bedtime. (Patient not taking: Reported on 11/15/2023), Disp: , Rfl:    sacubitril -valsartan  (ENTRESTO ) 24-26 MG, Take 1 tablet by mouth 2 (two) times daily., Disp: , Rfl:    Semaglutide-Weight Management 2.4 MG/0.75ML SOAJ, Inject 2.4 mg into the skin once a week., Disp: , Rfl:    spironolactone (ALDACTONE) 25 MG tablet, Take 12.5 mg by mouth daily. (Patient not taking: Reported on 11/15/2023), Disp: , Rfl:    tamsulosin  (FLOMAX ) 0.4 MG CAPS capsule, Take 0.8 mg by mouth daily after supper. (Patient not taking: Reported on 11/15/2023), Disp: , Rfl:    torsemide (DEMADEX) 20 MG tablet, Take 20 mg by mouth daily. (Patient not taking: Reported on 11/15/2023), Disp: , Rfl:   Past Medical History: Past Medical History:  Diagnosis Date   Anxiety    CAD (coronary artery disease)    a. s/p CABG 1994 with LIMA to D2 and LAD, SVG to D1 and ramus intermedius. b. acute inferior MI 2001 s/p rescue PTCA/stent to [address] [facility].  b. NSTEMI 02/2014: s/p LHC with DES to oLCx c. 09/2015 PCI with DES to left main/ostial LCx   Chronic bronchitis (HCC)    they say I get it q yr (10/03/2015)   CKD (  chronic kidney disease), stage II    Depression    ED (erectile dysfunction)    GERD (gastroesophageal reflux disease)    Hyperlipidemia    Hypertension    Ischemic cardiomyopathy    a. EF 48% by nuc in 2012.   Myocardial infarction [facility]) several   Nephrolithiasis    Obesity    Prediabetes     Tobacco Use: Social History   Tobacco Use  Smoking Status Never  Smokeless Tobacco Never    Labs: Review Flowsheet  More data exists      Latest Ref Rng & Units 10/09/2010 02/18/2015 02/19/2015 10/04/2015 03/27/2022  Labs for ITP Cardiac and Pulmonary Rehab  Cholestrol 0 - 200 mg/dL 863  - 897  867  891   LDL (calc) 0 - 99 mg/dL 77  - 29  82  48   HDL-C  >40 mg/dL 61.19  - 35  34  30   Trlycerides <150 mg/dL 896.9  - 807  78  851   Hemoglobin A1c 4.8 - 5.6 % - 5.6  - - 5.7      Exercise Target Goals: Exercise Program Goal: Individual exercise prescription set using results from initial 6 min walk test and THRR while considering  patient's activity barriers and safety.   Exercise Prescription Goal: Initial exercise prescription builds to 30-45 minutes a day of aerobic activity, 2-3 days per week.  Home exercise guidelines will be given to patient during program as part of exercise prescription that the participant will acknowledge.   Education: Aerobic Exercise: - Group verbal and visual presentation on the components of exercise prescription. Introduces F.I.T.T principle from ACSM for exercise prescriptions.  Reviews F.I.T.T. principles of aerobic exercise including progression. Written material provided at class time.   Education: Resistance Exercise: - Group verbal and visual presentation on the components of exercise prescription. Introduces F.I.T.T principle from ACSM for exercise prescriptions  Reviews F.I.T.T. principles of resistance exercise including progression. Written material provided at class time.    Education: Exercise & Equipment Safety: - Individual verbal instruction and demonstration of equipment use and safety with use of the equipment.   Education: Exercise Physiology & General Exercise Guidelines: - Group verbal and written instruction with models to review the exercise physiology of the cardiovascular system and associated critical values. Provides general exercise guidelines with specific guidelines to those with heart or lung disease. Written material provided at class time.   Education: Flexibility, Balance, Mind/Body Relaxation: - Group verbal and visual presentation with interactive activity on the components of exercise prescription. Introduces F.I.T.T principle from ACSM for exercise prescriptions.  Reviews F.I.T.T. principles of flexibility and balance exercise training including progression. Also discusses the mind body connection.  Reviews various relaxation techniques to help reduce and manage stress (i.e. Deep breathing, progressive muscle relaxation, and visualization). Balance handout provided to take home. Written material provided at class time.   Activity Barriers & Risk Stratification:   6 Minute Walk:  6 Minute Walk     Row Name 11/10/23 1305         6 Minute Walk   Phase Discharge     Distance 1320 feet     Distance % Change -4.35 %     Distance Feet Change 60 ft     Walk Time 6 minutes     # of Rest Breaks 0     MPH 2.5     METS 2.3     RPE 12     Perceived Dyspnea  0     VO2 Peak 8.2     Symptoms No     Resting HR 63 bpm     Resting BP 108/60     Max Ex. HR 99 bpm     Max Ex. BP 120/60        Oxygen Initial Assessment:   Oxygen Re-Evaluation:   Oxygen Discharge (Final Oxygen Re-Evaluation):   Initial Exercise Prescription:   Perform Capillary Blood Glucose checks as needed.  Exercise Prescription Changes:   Exercise Prescription Changes     Row Name 09/29/23 1529 10/25/23 1400 11/10/23 1600         Response to Exercise   Blood Pressure (Admit) 112/64 116/68 108/60     Blood Pressure (Exercise) 112/60 -- 120/60     Blood Pressure (Exit) 110/60 98/60 110/60     Heart Rate (Admit) 69 bpm 62 bpm 63 bpm     Heart Rate (Exercise) 102 bpm 92 bpm 102 bpm     Heart Rate (Exit) 64 bpm 63 bpm 71 bpm     Rating of Perceived Exertion (Exercise) 11 12 12      Symptoms None None None     Comments Reviewed METs and goals Reviewed home exercise Rx Rev METs and goals     Duration Continue with 30 min of aerobic exercise without signs/symptoms of physical distress. Continue with 30 min of aerobic exercise without signs/symptoms of physical distress. Continue with 30 min of aerobic exercise without signs/symptoms of physical distress.     Intensity  THRR unchanged THRR unchanged THRR unchanged       Progression   Progression Continue to progress workloads to maintain intensity without signs/symptoms of physical distress. Continue to progress workloads to maintain intensity without signs/symptoms of physical distress. Continue to progress workloads to maintain intensity without signs/symptoms of physical distress.     Average METs 2.2 2.7 2.5       Resistance Training   Training Prescription No Yes No     Weight No weights on wednesdays 3 lbs no wts on Wednesdays     Reps -- 10-15 --     Time -- 5 Minutes --       Interval Training   Interval Training No No No       Recumbant Bike   Level 2 4 --     RPM 47 42 --     Watts 25 40 --     Minutes 15 15 --     METs 2.4 3.2 --       NuStep   Level 2 2 2      SPM 73 70 64     Minutes 15 15 15      METs 2 2.1 2.1       Track   Laps -- -- --  6 MWT (1320 ft)     Minutes -- -- 6     METs -- -- 2.91       Home Exercise Plan   Plans to continue exercise at -- Home (comment) Home (comment)     Frequency -- Add 3 additional days to program exercise sessions. Add 3 additional days to program exercise sessions.     Initial Home Exercises Provided -- 10/25/23 10/25/23        Exercise Comments:   Exercise Comments     Row Name 09/29/23 1400 10/13/23 1500 10/18/23 1526 10/25/23 1431     Exercise Comments Reviewed METs and goals with the patient. Pt is  making progress on his METS. Pt's attendance has not been consistent, on 7 sessions of data for review. Pt due for METs review, did not attend. Will complete upon his return. Reviewed METs. METs are inconsistent due to patient's inconsistent attendance. Reviewed home exercise Rx with patient today. Pt attens the CRP2 program 2x/week and was encouraged to begin walking at home to supplement his exercise. Pt will walk 2-3x/week for 30 minutes. Pt verblaized understanding of the home exercise Rx and was provided a copy.       Exercise  Goals and Review:   Exercise Goals Re-Evaluation :  Exercise Goals Re-Evaluation     Row Name 09/29/23 1400 11/10/23 1623           Exercise Goal Re-Evaluation   Exercise Goals Review Increase Physical Activity;Understanding of Exercise Prescription;Increase Strength and Stamina;Knowledge and understanding of Target Heart Rate Range (THRR);Able to understand and use rate of perceived exertion (RPE) scale Increase Physical Activity;Understanding of Exercise Prescription;Increase Strength and Stamina;Knowledge and understanding of Target Heart Rate Range (THRR);Able to understand and use rate of perceived exertion (RPE) scale      Comments Reviewed METs and goals with patient today. Pt voices progress on his goals of improved strength, stamina, and less SOB. Reviewed METs and goals with patient today. Pt  continues to voice progress on his goals of improved strength, stamina, and less SOB. Pt has goal of weight loss and has lost 12 lbs since starting the program.      Expected Outcomes Will continue to monitor patient and progress exercise workloads as tolerated. Will continue to monitor patient and progress exercise workloads as tolerated.         Discharge Exercise Prescription (Final Exercise Prescription Changes):  Exercise Prescription Changes - 11/10/23 1600       Response to Exercise   Blood Pressure (Admit) 108/60    Blood Pressure (Exercise) 120/60    Blood Pressure (Exit) 110/60    Heart Rate (Admit) 63 bpm    Heart Rate (Exercise) 102 bpm    Heart Rate (Exit) 71 bpm    Rating of Perceived Exertion (Exercise) 12    Symptoms None    Comments Rev METs and goals    Duration Continue with 30 min of aerobic exercise without signs/symptoms of physical distress.    Intensity THRR unchanged      Progression   Progression Continue to progress workloads to maintain intensity without signs/symptoms of physical distress.    Average METs 2.5      Resistance Training   Training  Prescription No    Weight no wts on Wednesdays      Interval Training   Interval Training No      NuStep   Level 2    SPM 64    Minutes 15    METs 2.1      Track   Laps --   6 MWT (1320 ft)   Minutes 6    METs 2.91      Home Exercise Plan   Plans to continue exercise at Home (comment)    Frequency Add 3 additional days to program exercise sessions.    Initial Home Exercises Provided 10/25/23          Nutrition:  Target Goals: Understanding of nutrition guidelines, daily intake of sodium 1500mg , cholesterol 200mg , calories 30% from fat and 7% or less from saturated fats, daily to have 5 or more servings of fruits and vegetables.  Education: Nutrition 1 -Group instruction  provided by verbal, written material, interactive activities, discussions, models, and posters to present general guidelines for heart healthy nutrition including macronutrients, label reading, and promoting whole foods over processed counterparts. Education serves as Pensions consultant of discussion of heart healthy eating for all. Written material provided at class time.    Education: Nutrition 2 -Group instruction provided by verbal, written material, interactive activities, discussions, models, and posters to present general guidelines for heart healthy nutrition including sodium, cholesterol, and saturated fat. Providing guidance of habit forming to improve blood pressure, cholesterol, and body weight. Written material provided at class time.     Biometrics:   Post Biometrics - 11/10/23 1255        Post  Biometrics   Height 5' 11 (1.803 m)    Weight 235 lb 14.3 oz (107 kg)    Waist Circumference 48.5 inches    Hip Circumference 45 inches    Waist to Hip Ratio 1.08 %    BMI (Calculated) 32.91    Triceps Skinfold 16 mm    % Body Fat 33.6 %    Grip Strength 22 kg    Flexibility 10 in    Single Leg Stand 2.5 seconds          Nutrition Therapy Plan and Nutrition Goals:  Nutrition Therapy &  Goals - 10/04/23 1435       Nutrition Therapy   Diet Heart Healthy Diet    Drug/Food Interactions Statins/Certain Fruits      Personal Nutrition Goals   Nutrition Goal Patient to identify strategies for reducing cardiovascular risk by attending the [employer] education and nutrition series weekly.   goal in action.   Personal Goal #2 Patient to improve diet quality by using the plate method as a guide for meal planning to include lean protein/plant protein, fruits, vegetables, whole grains, nonfat dairy as part of a well-balanced diet.   goal in action.   Personal Goal #3 Patient to identify strategies for weight loss of 0.5-2.0# per week.   goal in action.   Comments Goals in action. Patient has medical history of CAD, CVA, Afib, HTN, NSTEMI, acute CHF. LDL is well controlled. He continues semaglutide to aid with weight loss; he is down 9.2# since starting with our program. He continues to attend the pritikin education and nutrition series regularly. Patient will benefit from participation in intensive cardiac rehab for nutrition education, exercise, and lifestyle modification.      Intervention Plan   Intervention Nutrition handout(s) given to patient.;Prescribe, educate and counsel regarding individualized specific dietary modifications aiming towards targeted core components such as weight, hypertension, lipid management, diabetes, heart failure and other comorbidities.    Expected Outcomes Short Term Goal: Understand basic principles of dietary content, such as calories, fat, sodium, cholesterol and nutrients.;Long Term Goal: Adherence to prescribed nutrition plan.          Nutrition Assessments:  MEDIFICTS Score Key: >=70 Need to make dietary changes  40-70 Heart Healthy Diet <= 40 Therapeutic Level Cholesterol Diet  Flowsheet Row INTENSIVE CARDIAC REHAB from 11/10/2023 in [facility]  Picture Your Plate Total Score on  Discharge 51   Picture Your Plate Scores: <59 Unhealthy dietary pattern with much room for improvement. 41-50 Dietary pattern unlikely to meet recommendations for good health and room for improvement. 51-60 More healthful dietary pattern, with some room for improvement.  >60 Healthy dietary pattern, although there may be some specific behaviors that could be improved.  Nutrition Goals Re-Evaluation:  Nutrition Goals Re-Evaluation     Row Name 10/04/23 1435             Goals   Current Weight 240 lb 4.8 oz (109 kg)       Comment A1c WNL, GFR 55, Cr 1.35, HDL 27, LDL 45       Expected Outcome Goals in action. Patient has medical history of CAD, CVA, Afib, HTN, NSTEMI, acute CHF. LDL is well controlled. He continues semaglutide to aid with weight loss; he is down 9.2# since starting with our program. He continues to attend the pritikin education and nutrition series regularly. Patient will benefit from participation in intensive cardiac rehab for nutrition education, exercise, and lifestyle modification.          Nutrition Goals Discharge (Final Nutrition Goals Re-Evaluation):  Nutrition Goals Re-Evaluation - 10/04/23 1435       Goals   Current Weight 240 lb 4.8 oz (109 kg)    Comment A1c WNL, GFR 55, Cr 1.35, HDL 27, LDL 45    Expected Outcome Goals in action. Patient has medical history of CAD, CVA, Afib, HTN, NSTEMI, acute CHF. LDL is well controlled. He continues semaglutide to aid with weight loss; he is down 9.2# since starting with our program. He continues to attend the pritikin education and nutrition series regularly. Patient will benefit from participation in intensive cardiac rehab for nutrition education, exercise, and lifestyle modification.          Psychosocial: Target Goals: Acknowledge presence or absence of significant depression and/or stress, maximize coping skills, provide positive support system. Participant is able to verbalize types and ability to use  techniques and skills needed for reducing stress and depression.   Education: Stress, Anxiety, and Depression - Group verbal and visual presentation to define topics covered.  Reviews how body is impacted by stress, anxiety, and depression.  Also discusses healthy ways to reduce stress and to treat/manage anxiety and depression. Written material provided at class time.   Education: Sleep Hygiene -Provides group verbal and written instruction about how sleep can affect your health.  Define sleep hygiene, discuss sleep cycles and impact of sleep habits. Review good sleep hygiene tips.   Initial Review & Psychosocial Screening:   Quality of Life Scores:   Quality of Life - 11/11/23 0809       Quality of Life Scores   Health/Function Pre 21.9 %    Health/Function Post 15.1 %    Health/Function % Change -31.05 %    Socioeconomic Pre 22.31 %    Socioeconomic Post 16.69 %    Socioeconomic % Change  -25.19 %    Psych/Spiritual Pre 23.57 %    Psych/Spiritual Post 20.36 %    Psych/Spiritual % Change -13.62 %    Family Pre 20.2 %    Family Post 14.4 %    Family % Change -28.71 %    GLOBAL Pre 22.09 %    GLOBAL Post 16.4 %    GLOBAL % Change -25.76 %         Scores of 19 and below usually indicate a poorer quality of life in these areas.  A difference of  2-3 points is a clinically meaningful difference.  A difference of 2-3 points in the total score of the Quality of Life Index has been associated with significant improvement in overall quality of life, self-image, physical symptoms, and general health in studies assessing change in quality of life.  PHQ-9: Review Flowsheet  11/15/2023 08/26/2023 02/26/2015  Depression screen PHQ 2/9  Decreased Interest 0 0 0  Down, Depressed, Hopeless 0 0 0  PHQ - 2 Score 0 0 0  Altered sleeping 0 0 -  Tired, decreased energy 0 0 -  Change in appetite 0 0 -  Feeling bad or failure about yourself  0 0 -  Trouble concentrating 0 0 -  Moving  slowly or fidgety/restless 0 0 -  Suicidal thoughts 0 0 -  PHQ-9 Score 0 0 -   Interpretation of Total Score  Total Score Depression Severity:  1-4 = Minimal depression, 5-9 = Mild depression, 10-14 = Moderate depression, 15-19 = Moderately severe depression, 20-27 = Severe depression   Psychosocial Evaluation and Intervention:   Psychosocial Re-Evaluation:  Psychosocial Re-Evaluation     Row Name 10/19/23 0849 11/10/23 0835           Psychosocial Re-Evaluation   Current issues with Current Psychotropic Meds  Patient is taking cymbalta  Current Psychotropic Meds      Comments William Matthews has not voiced any additional psychosocial concerns or stressors during exercise at cardiac rehab. William Matthews has not voiced any additional psychosocial concerns or stressors during exercise at cardiac rehab.      Expected Outcomes Pt will have controlled/reduced stress and anxiety by completion of cardiac rehab. Pt will continue to manage any stress/concerns that arise with skills learned through cardiac rehab.      Interventions Stress management education;Relaxation education;Encouraged to attend Cardiac Rehabilitation for the exercise Stress management education;Relaxation education;Encouraged to attend Cardiac Rehabilitation for the exercise      Continue Psychosocial Services  Follow up required by staff Follow up required by staff         Psychosocial Discharge (Final Psychosocial Re-Evaluation):  Psychosocial Re-Evaluation - 11/10/23 0835       Psychosocial Re-Evaluation   Current issues with Current Psychotropic Meds    Comments William Matthews has not voiced any additional psychosocial concerns or stressors during exercise at cardiac rehab.    Expected Outcomes Pt will continue to manage any stress/concerns that arise with skills learned through cardiac rehab.    Interventions Stress management education;Relaxation education;Encouraged to attend Cardiac Rehabilitation for the exercise    Continue  Psychosocial Services  Follow up required by staff          Vocational Rehabilitation: Provide vocational rehab assistance to qualifying candidates.   Vocational Rehab Evaluation & Intervention:   Education: Education Goals: Education classes will be provided on a variety of topics geared toward better understanding of heart health and risk factor modification. Participant will state understanding/return demonstration of topics presented as noted by education test scores.  Learning Barriers/Preferences:   General Cardiac Education Topics:  AED/CPR: - Group verbal and written instruction with the use of models to demonstrate the basic use of the AED with the basic ABC's of resuscitation.   Test and Procedures: - Group verbal and visual presentation and models provide information about basic cardiac anatomy and function. Reviews the testing methods done to diagnose heart disease and the outcomes of the test results. Describes the treatment choices: Medical Management, Angioplasty, or Coronary Bypass Surgery for treating various heart conditions including Myocardial Infarction, Angina, Valve Disease, and Cardiac Arrhythmias. Written material provided at class time.   Medication Safety: - Group verbal and visual instruction to review commonly prescribed medications for heart and lung disease. Reviews the medication, class of the drug, and side effects. Includes the steps to properly store meds and maintain the prescription  regimen. Written material provided at class time.   Intimacy: - Group verbal instruction through game format to discuss how heart and lung disease can affect sexual intimacy. Written material provided at class time.   Know Your Numbers and Heart Failure: - Group verbal and visual instruction to discuss disease risk factors for cardiac and pulmonary disease and treatment options.  Reviews associated critical values for Overweight/Obesity, Hypertension, Cholesterol,  and Diabetes.  Discusses basics of heart failure: signs/symptoms and treatments.  Introduces Heart Failure Zone chart for action plan for heart failure. Written material provided at class time.   Infection Prevention: - Provides verbal and written material to individual with discussion of infection control including proper hand washing and proper equipment cleaning during exercise session.   Falls Prevention: - Provides verbal and written material to individual with discussion of falls prevention and safety.   Other: -Provides group and verbal instruction on various topics (see comments)   Knowledge Questionnaire Score:   Core Components/Risk Factors/Patient Goals at Admission:   Education:Diabetes - Individual verbal and written instruction to review signs/symptoms of diabetes, desired ranges of glucose level fasting, after meals and with exercise. Acknowledge that pre and post exercise glucose checks will be done for 3 sessions at entry of program.   Core Components/Risk Factors/Patient Goals Review:   Goals and Risk Factor Review     Row Name 10/19/23 0851 11/10/23 0840           Core Components/Risk Factors/Patient Goals Review   Personal Goals Review Weight Management/Obesity;Heart Failure;Hypertension;Lipids Weight Management/Obesity;Heart Failure;Hypertension;Lipids      Review William Matthews is doing well with exercise at cardiac rehab. Vital signs have been stable. William Matthews has increased his met levels on the recumbent bike. William Matthews is doing well with exercise at cardiac rehab when in attendance. VSS. William Matthews has increased his met levels      Expected Outcomes Pt will continue to participate in cardiac rehab for exercise, nutrition, and lifestyle modifications Pt will continue to participate in cardiac rehab for exercise, nutrition, and lifestyle modifications         Core Components/Risk Factors/Patient Goals at Discharge (Final Review):   Goals and Risk Factor Review - 11/10/23  0840       Core Components/Risk Factors/Patient Goals Review   Personal Goals Review Weight Management/Obesity;Heart Failure;Hypertension;Lipids    Review William Matthews is doing well with exercise at cardiac rehab when in attendance. VSS. William Matthews has increased his met levels    Expected Outcomes Pt will continue to participate in cardiac rehab for exercise, nutrition, and lifestyle modifications          ITP Comments:  ITP Comments     Row Name 10/19/23 0848 11/10/23 0831         ITP Comments 30 Day ITP Review. Pt started cardiac rehab on 08/30/23.  William Matthews has good attendance and participation with exercise at cardiac rehab 30 Day ITP Review.  William Matthews has spotted attendance but good participation with exercise at cardiac rehab         Comments: see ITP comments

## 2023-11-17 ENCOUNTER — Encounter (HOSPITAL_COMMUNITY)
Admission: RE | Admit: 2023-11-17 | Discharge: 2023-11-17 | Disposition: A | Source: Ambulatory Visit | Attending: Cardiology

## 2023-11-17 DIAGNOSIS — I219 Acute myocardial infarction, unspecified: Secondary | ICD-10-CM

## 2023-11-17 DIAGNOSIS — Z951 Presence of aortocoronary bypass graft: Secondary | ICD-10-CM

## 2023-12-22 ENCOUNTER — Ambulatory Visit (INDEPENDENT_AMBULATORY_CARE_PROVIDER_SITE_OTHER): Admitting: Podiatry

## 2023-12-22 DIAGNOSIS — G609 Hereditary and idiopathic neuropathy, unspecified: Secondary | ICD-10-CM

## 2023-12-22 DIAGNOSIS — G5790 Unspecified mononeuropathy of unspecified lower limb: Secondary | ICD-10-CM

## 2023-12-22 MED ORDER — PREGABALIN 50 MG PO CAPS: 50.0000 mg | ORAL_CAPSULE | Freq: Three times a day (TID) | ORAL | 0 refills | Status: AC

## 2023-12-22 MED ORDER — PREGABALIN 100 MG PO CAPS: 100.0000 mg | ORAL_CAPSULE | Freq: Three times a day (TID) | ORAL | 3 refills | Status: AC

## 2023-12-22 NOTE — Progress Notes (Signed)
 Patient presents for complaint of numbness tingling cold and sharp sensation in hypersensitive sensations in the feet bilaterally.  Very hypersensitive to touch.  Has been going on for several months.  Says he had tried to gabapentin several years ago.  No history of any diabetes, chemotherapy, or drug or alcohol  abuse.   Physical exam:  General appearance: Pleasant, and in no acute distress. AOx3.  Vascular: Pedal pulses: DP 2/4 bilaterally, PT 2/4 bilaterally.  Mild to moderate edema lower legs bilaterally. Capillary fill time immediate bilaterally.  Neurological: Diminished Achilles reflex bilaterally.  Monofilament sensation send in forefoot bilaterally.  Vibratory sensation is significantly diminished bilaterally no clonus or spasticity noted.   Dermatologic:   Skin normal temperature bilaterally.  Some skin atrophy with no hair growth lower extremity bilaterally Musculoskeletal: Hammertoe deformities 2 through 5 bilaterally normal muscle strength lower extremity bilaterally.   Diagnosis: 1.  Neuritis feet bilaterally 2.  Peripheral  neuropathy, idiopathic bilaterally  Plan: -New patient office visit for evaluation management level 3. - Discussed peripheral neuropathy and etiology and treatment.  Will start him out with Lyrica since he has already tried gabapentin before.  Told him what to expect while taking it.  Briefly discussed other treatment options that are available if the Lyrica is not successful at alleviating symptoms. -Rx Lyrica 50 mg, 1 p.o. 3 times daily for 2 weeks. - Rx Lyrica 100 mg, 1 p.o. 3 times daily, 3 refills  Return 3 months follow-up neuropathy

## 2023-12-30 ENCOUNTER — Telehealth: Payer: Self-pay | Admitting: Podiatry

## 2023-12-30 NOTE — Telephone Encounter (Signed)
 Patient's daughter called in about the prescription for Lyrica and would like more information about when patient is supposed to take medication.

## 2023-12-31 NOTE — Telephone Encounter (Signed)
Spoke to patient is aware

## 2024-02-18 ENCOUNTER — Other Ambulatory Visit: Payer: Self-pay | Admitting: Vascular Surgery

## 2024-02-18 DIAGNOSIS — I6523 Occlusion and stenosis of bilateral carotid arteries: Secondary | ICD-10-CM

## 2024-03-22 ENCOUNTER — Ambulatory Visit: Admitting: Podiatry
